# Patient Record
Sex: Male | Born: 1955 | Race: Black or African American | Hispanic: No | Marital: Married | Smoking: Never smoker
Health system: Southern US, Community
[De-identification: ages and names within clinical notes are randomized; demographics above are authoritative.]

## PROBLEM LIST (undated history)

## (undated) DIAGNOSIS — H409 Unspecified glaucoma: Secondary | ICD-10-CM

## (undated) DIAGNOSIS — I10 Essential (primary) hypertension: Secondary | ICD-10-CM

## (undated) DIAGNOSIS — K219 Gastro-esophageal reflux disease without esophagitis: Secondary | ICD-10-CM

## (undated) DIAGNOSIS — E785 Hyperlipidemia, unspecified: Secondary | ICD-10-CM

## (undated) DIAGNOSIS — R0982 Postnasal drip: Secondary | ICD-10-CM

## (undated) DIAGNOSIS — T8859XA Other complications of anesthesia, initial encounter: Secondary | ICD-10-CM

## (undated) DIAGNOSIS — E109 Type 1 diabetes mellitus without complications: Secondary | ICD-10-CM

## (undated) DIAGNOSIS — E039 Hypothyroidism, unspecified: Secondary | ICD-10-CM

## (undated) DIAGNOSIS — Z973 Presence of spectacles and contact lenses: Secondary | ICD-10-CM

## (undated) DIAGNOSIS — N400 Enlarged prostate without lower urinary tract symptoms: Secondary | ICD-10-CM

## (undated) DIAGNOSIS — R0989 Other specified symptoms and signs involving the circulatory and respiratory systems: Secondary | ICD-10-CM

## (undated) HISTORY — DX: Type 1 diabetes mellitus without complications: E10.9

## (undated) HISTORY — DX: Postnasal drip: R09.82

## (undated) HISTORY — PX: KNEE SURGERY: SHX244

## (undated) HISTORY — PX: CHOLECYSTECTOMY: SHX55

## (undated) HISTORY — PX: TOE SURGERY: SHX1073

## (undated) HISTORY — DX: Hypothyroidism, unspecified: E03.9

## (undated) HISTORY — PX: ANKLE SURGERY: SHX546

## (undated) HISTORY — DX: Essential (primary) hypertension: I10

## (undated) HISTORY — DX: Hyperlipidemia, unspecified: E78.5

---
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
## 1986-07-05 HISTORY — PX: KNEE SURGERY: SHX244

## 1993-07-05 HISTORY — PX: CHOLECYSTECTOMY: SHX55

## 1997-07-05 HISTORY — PX: VITRECTOMY: SHX106

## 1998-07-05 HISTORY — PX: OTHER SURGICAL HISTORY: SHX169

## 1999-05-11 ENCOUNTER — Ambulatory Visit (HOSPITAL_COMMUNITY): Admission: RE | Admit: 1999-05-11 | Discharge: 1999-05-12 | Payer: Self-pay | Admitting: Ophthalmology

## 1999-09-14 ENCOUNTER — Ambulatory Visit (HOSPITAL_COMMUNITY): Admission: RE | Admit: 1999-09-14 | Discharge: 1999-09-14 | Payer: Self-pay | Admitting: Ophthalmology

## 1999-09-14 ENCOUNTER — Encounter: Payer: Self-pay | Admitting: Ophthalmology

## 1999-09-21 ENCOUNTER — Encounter: Admission: RE | Admit: 1999-09-21 | Discharge: 1999-12-20 | Payer: Self-pay | Admitting: Internal Medicine

## 2000-01-11 ENCOUNTER — Encounter: Admission: RE | Admit: 2000-01-11 | Discharge: 2000-04-10 | Payer: Self-pay | Admitting: Internal Medicine

## 2000-11-21 ENCOUNTER — Encounter (INDEPENDENT_AMBULATORY_CARE_PROVIDER_SITE_OTHER): Payer: Self-pay | Admitting: Specialist

## 2000-11-21 ENCOUNTER — Ambulatory Visit (HOSPITAL_COMMUNITY): Admission: RE | Admit: 2000-11-21 | Discharge: 2000-11-21 | Payer: Self-pay | Admitting: Gastroenterology

## 2001-07-05 HISTORY — PX: EYE SURGERY: SHX253

## 2002-10-25 ENCOUNTER — Ambulatory Visit (HOSPITAL_COMMUNITY): Admission: RE | Admit: 2002-10-25 | Discharge: 2002-10-25 | Payer: Self-pay | Admitting: *Deleted

## 2003-04-17 ENCOUNTER — Encounter: Admission: RE | Admit: 2003-04-17 | Discharge: 2003-07-16 | Payer: Self-pay | Admitting: Internal Medicine

## 2003-08-05 ENCOUNTER — Ambulatory Visit (HOSPITAL_COMMUNITY): Admission: RE | Admit: 2003-08-05 | Discharge: 2003-08-05 | Payer: Self-pay | Admitting: Ophthalmology

## 2003-11-05 ENCOUNTER — Encounter: Admission: RE | Admit: 2003-11-05 | Discharge: 2004-02-03 | Payer: Self-pay | Admitting: Internal Medicine

## 2005-06-08 ENCOUNTER — Encounter: Admission: RE | Admit: 2005-06-08 | Discharge: 2005-09-06 | Payer: Self-pay | Admitting: Internal Medicine

## 2008-07-05 HISTORY — PX: ANKLE SURGERY: SHX546

## 2009-08-04 ENCOUNTER — Encounter: Admission: RE | Admit: 2009-08-04 | Discharge: 2009-08-04 | Payer: Self-pay | Admitting: Internal Medicine

## 2010-10-24 DIAGNOSIS — I10 Essential (primary) hypertension: Secondary | ICD-10-CM

## 2010-10-24 DIAGNOSIS — E039 Hypothyroidism, unspecified: Secondary | ICD-10-CM

## 2010-10-24 DIAGNOSIS — E119 Type 2 diabetes mellitus without complications: Secondary | ICD-10-CM | POA: Insufficient documentation

## 2010-11-20 NOTE — Op Note (Signed)
[place]. [facility]  Patient:    Tom Baker, Tom Baker                          MRN: [id number] Proc. Date: 09/14/99 Adm. Date:  [id number] Disc. 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  PROCEDURE: 1. Posterior vitrectomy with Endolaser photocoagulation of the left eye. 2. Removal of intraocular implant, vitreous chamber - silicone oil, OS.  SURGEON:  Ernesto Rutherford, M.D.  ANESTHESIA:  Local retrobulbar anesthesia, left eye.  INDICATIONS FOR PROCEDURE:  The patient is a 55 year old man who has profound vision loss of the left eye with potential for good visual acuity.  The patient had a vitrectomy of the left eye with complex retinal detachment repair using intraocular implant - vitreous - silicone oil.  The patient has a chance to regain visual acuity function in the left eye, and to remain employed.  The patient understands this is an attempt to remove the silicone oil, and to diminish likelihood of silicone oil induced cataracts, or other complications, as well as to enhance potential of visual acuity outcome.  He understands the risks of anesthesia including the rare occurrence of death, but also to the eye including hemorrhage, infection, scarring, need for further surgery, retinal detachment, loss of vision, and progression of disease despite intervention.  DESCRIPTION OF PROCEDURE:  After the appropriate  signed consent was obtained, the patient was taken to the operating room.  In the operating room, monitored anesthesia was instituted without difficulty.  5 cc of retrobulbar Marcaine was  injected in the left eye without difficulty and an additional 5 cc of 0.75% Marcaine injected laterally and fashioned in modified van Lint.  The left periocular region was sterilely prepped and draped in the usual ophthalmic fashion. A lid speculum was applied.  Conjunctival peritomy fashioned temporally and superonasal.  A 4 mm disposable infusion was then secured.  Superior sclerotomies were then fashioned.  Wild microscope was placed in position with a biom attachment.  Vitreous fluid evacuator was placed into the vitreous cavity and silicone oil removed without difficulty, and without damage to the retina or to the lens.  Endolaser photocoagulation was then placed encircling the posterior pole and nasally.  The retina stayed nicely attached.  Hemostasis was spontaneous.  At this time, the superior sclerotomy was then closed.  The infusion removed and similarly closed with 7-0 Vicryl suture.  Subconjunctival injection of antibiotic and steroid were applied after the conjunctiva had been closed with 7-0 Vicryl suture.  The patient tolerated the procedure well without complication.  A sterile patch and Fox shield applied.  The patient was taken to the outpatient area to be discharged home as an outpatient. DD:  09/14/99 TD:  09/15/99 Job: [id number]

## 2010-11-20 NOTE — Procedures (Signed)
[place]. [facility]  Patient:    Tom Baker, Tom Baker                          MRN: [id number] Proc. Date: 11/21/00 Adm. Date:  [id number] Attending:  Nelda Marseille CC:         Lindell Spar. Chestine Spore, M.D.   Procedure Report  PROCEDURE:  Colonoscopy with biopsy.  INDICATION:  Patient with family history of colon cancer.  Consent was signed after risks, benefits, methods, and options were thoroughly discussed in the office.  MEDICATIONS:  Demerol 80, Versed 8.  PROCEDURE:  Rectal inspection was pertinent for maybe tiny hemorrhoids. Digital examination was negative.  The video colonoscope was inserted and fairly easily advanced around the colon to the cecum.  No obvious abnormality was seen on insertion.  The cecum was identified by the appendiceal orifice and the ileocecal valve.  In fact, the scope was inserted a very short ways into the terminal ileum, which on quick evaluation was normal.  Scope was slowly withdrawn.  There was a small amount of stool adherent to the wall of various parts of the colon, which could not be washed off and there were some nuts and seeds that did clog the scope frequently, but with lots of washing and suctioning because of a fair prep, we did use over a liter of water.  We were probably able to obtain adequate visualization.  Certainly small lesions could have been missed, but on slow withdrawal through the colon, the cecum, ascending, transverse, descending, and majority of the sigmoid were all normal.  In distal sigmoid, two tiny possible polyp were seen and were each cold biopsied x 1.  Back in the rectum, the scope was retroflexed, pertinent for questionable tiny internal hemorrhoids.  Scope was straightened, air was withdrawn and the scope removed.  The patient tolerated the procedure well. There were no obvious immediate complication.  Not mentioned above, to advance to the cecum, did require some abdominal pressure, but no  position changes. The patient tolerated the procedure well.  There was no obvious immediate complication.  ENDOSCOPIC DIAGNOSES: 1. Questionable tiny internal and external hemorrhoids. 2. Two tiny distal sigmoid possible polyps, status post cold biopsy. 3. Some stool adherent to the walls of the colon unable to wash off,    particularly in the rectum, cecum, and flexures and ascending colon. 4. Otherwise within normal limits to the cecum in the quick look at the end of    the terminal ileum.  PLAN:  Await pathology, but recheck colon screening in five years.  Probably would use more aggressive colon prep.  Follow-up p.r.n., otherwise return care to Dr. Chestine Spore for the customary healthcare maintenance to include yearly rectals and guaiacs. DD:  11/21/00 TD:  11/21/00 Job: [id number]

## 2015-07-06 DIAGNOSIS — I3139 Other pericardial effusion (noninflammatory): Secondary | ICD-10-CM

## 2015-07-06 DIAGNOSIS — I313 Pericardial effusion (noninflammatory): Secondary | ICD-10-CM

## 2015-07-06 DIAGNOSIS — J189 Pneumonia, unspecified organism: Secondary | ICD-10-CM

## 2015-07-06 HISTORY — DX: Other pericardial effusion (noninflammatory): I31.39

## 2015-07-06 HISTORY — DX: Pericardial effusion (noninflammatory): I31.3

## 2015-07-06 HISTORY — DX: Pneumonia, unspecified organism: J18.9

## 2015-11-06 ENCOUNTER — Encounter: Payer: Self-pay | Admitting: Cardiovascular Disease

## 2015-11-06 ENCOUNTER — Ambulatory Visit (INDEPENDENT_AMBULATORY_CARE_PROVIDER_SITE_OTHER): Payer: BC Managed Care – PPO | Admitting: Cardiovascular Disease

## 2015-11-06 VITALS — BP 140/74 | HR 80 | Ht 75.0 in | Wt 217.8 lb

## 2015-11-06 DIAGNOSIS — R0602 Shortness of breath: Secondary | ICD-10-CM

## 2015-11-06 DIAGNOSIS — E785 Hyperlipidemia, unspecified: Secondary | ICD-10-CM | POA: Diagnosis not present

## 2015-11-06 DIAGNOSIS — I10 Essential (primary) hypertension: Secondary | ICD-10-CM

## 2015-11-06 DIAGNOSIS — E109 Type 1 diabetes mellitus without complications: Secondary | ICD-10-CM | POA: Insufficient documentation

## 2015-11-06 DIAGNOSIS — R600 Localized edema: Secondary | ICD-10-CM | POA: Diagnosis not present

## 2015-11-06 DIAGNOSIS — E039 Hypothyroidism, unspecified: Secondary | ICD-10-CM | POA: Insufficient documentation

## 2015-11-06 NOTE — Patient Instructions (Signed)
Medication Instructions:  Your physician recommends that you continue on your current medications as directed. Please refer to the Current Medication list given to you today.  Labwork: NONE  Testing/Procedures: Your physician has requested that you have a lexiscan myoview. For further information please visit [email or web address]. Please follow instruction sheet, as given.  Your physician has requested that you have an echocardiogram. Echocardiography is a painless test that uses sound waves to create images of your heart. It provides your doctor with information about the size and shape of your heart and how well your heart's chambers and valves are working. This procedure takes approximately one hour. There are no restrictions for this procedure.  Follow-Up: Your physician wants you to follow-up in: 1 YEAR OV You will receive a reminder letter in the mail two months in advance. If you don't receive a letter, please call our office to schedule the follow-up appointment.  Any Other Special Instructions Will Be Listed Below (If Applicable).     If you need a refill on your cardiac medications before your next appointment, please call your pharmacy.

## 2015-11-06 NOTE — Progress Notes (Signed)
Cardiology Office Note   Date:  11/06/2015   ID:  Tom Baker, DOB [birth year], MRN [id number]  PCP:  Laurena Slimmer, MD  Cardiologist:   Chilton Si, MD   Chief Complaint  Patient presents with  . New Evaluation    Self-Referred  pt states he has been diabetic since he was 14; PCP wants him to have a stress test--consult; pt c/o swelling in ankles--depends on how long he stays on his feet; Hypertension     History of Present Illness: Tom Baker is a 60 y.o. male with hypertension, diabetes mellitus type I, and hypothyroidism who presents for an evaluation of lower extremity edema.  Tom Baker saw his PCP, Tom Baker, for routine follow up.  Given that he was diagnosed with DM Type I at age 56, Tom Baker recommended that he be evaluated by a cardiologist and considered for stress testing.  Tom Baker has been feeling well.  He has not noted any chest pain or shortness of breath he did not know any lower extremity edema but reports that his PCP stains. He has not noted any orthopnea or PND.  He does not get any formal exercise but is very active at work and has no limitations with these activities.  He has neuropathy in his legs and sometimes gets fatigue in his legs when walking long distances.   Past Medical History  Diagnosis Date  . Diabetes mellitus type 1 (HCC)   . Hypertension   . Hyperlipidemia   . Hypothyroid     No past surgical history on file.   Current Outpatient Prescriptions  Medication Sig Dispense Refill  . amLODipine (NORVASC) 5 MG tablet Take 5 mg by mouth daily.      Marland Kitchen amLODipine-atorvastatin (CADUET) 5-10 MG per tablet Take 1 tablet by mouth daily.      . Cholecalciferol (VITAMIN D3) 2000 units TABS Take 1 tablet by mouth daily.    . Insulin Infusion Pump DEVI by Does not apply route.      Marland Kitchen levothyroxine (SYNTHROID, LEVOTHROID) 125 MCG tablet Take 125 mcg by mouth daily.      Marland Kitchen losartan (COZAAR) 100 MG tablet Take 100 mg by mouth daily.      . Multiple  Vitamin (MULTIVITAMIN) tablet Take 1 tablet by mouth daily.    . NON FORMULARY 2 tbsp daily     No current facility-administered medications for this visit.    Allergies:   Review of patient's allergies indicates no known allergies.    Social History:  The patient  reports that he has never smoked. He does not have any smokeless tobacco history on file. He reports that he does not drink alcohol or use illicit drugs.   Family History:  The patient's family history is not on file.    ROS:  Please see the history of present illness.   Otherwise, review of systems are positive for none.   All other systems are reviewed and negative.   PHYSICAL EXAM: VS:  BP 140/74 mmHg  Pulse 80  Ht  (1.905 m)  Wt 98.793 kg (217 lb 12.8 oz)  BMI 27.22 kg/m2 , BMI Body mass index is 27.22 kg/(m^2). GENERAL:  Well appearing HEENT:  Pupils equal round and reactive, fundi not visualized, oral mucosa unremarkable NECK:  No jugular venous distention, waveform within normal limits, carotid upstroke brisk and symmetric, no bruits, no thyromegaly LYMPHATICS:  No cervical adenopathy LUNGS:  Clear to auscultation bilaterally HEART:  RRR.  PMI  not displaced or sustained,S1 and S2 within normal limits, no S3, no S4, no clicks, no rubs, no murmurs ABD:  Flat, positive bowel sounds normal in frequency in pitch, no bruits, no rebound, no guarding, no midline pulsatile mass, no hepatomegaly, no splenomegaly EXT:  2 plus pulses throughout, 1+ pitting edema bilaterally , no cyanosis no clubbing SKIN:  No rashes no nodules NEURO:  Cranial nerves II through XII grossly intact, motor grossly intact throughout PSYCH:  Cognitively intact, oriented to person place and time   EKG:  EKG is ordered today. The ekg ordered today demonstrates sinus rhythm. Rate 80 bpm.   Recent Labs: No results found for requested labs within last 365 days.    Lipid Panel No results found for: CHOL, TRIG, HDL, CHOLHDL, VLDL, LDLCALC,  LDLDIRECT   09/25/15: Sodium 140, potassium 4.5, BUN 15, creatinine 1.1 AST 26, ALT 20 WBC 5.4, hemoglobin 14.8, hematocrit 43.2, platelets 304 Total cholesterol 126, triglycerides 131, HDL 48, LDL 52 Hemoglobin Q4OA1c 6.1%.  Wt Readings from Last 3 Encounters:  11/06/15 98.793 kg (217 lb 12.8 oz)      ASSESSMENT AND PLAN:  # CV Risk: Tom Baker is at high risk for CAD given his long history of diabetes.  His A1c is well-controlled and he is on the appropriate therapy a for hypertension and hyperlipidemia.  We will obtain a Lexiscan Cardiolite to evaluate for ischemia, as diabetic patients often can have silent ischemia.  He is unable to walk on a treadmill due to an old ankle injury.  # Lower extremity edema: Tom Baker does not appear to have heart failure on exam.  He has some mild bilateral LE edema.  We will obtain an echo to ensure that he does not have any structural heart disease.  # Hypertension: Blood pressure is well-controlled.  Continue amlodipine and losartan.  # Hyperlipidemia: LDL is at goal. Continue atorvastatin.  Current medicines are reviewed at length with the patient today.  The patient does not have concerns regarding medicines.  The following changes have been made:  no change  Labs/ tests ordered today include:   Orders Placed This Encounter  Procedures  . Myocardial Perfusion Imaging  . EKG 12-Lead  . ECHOCARDIOGRAM COMPLETE     Disposition:   FU with Ashvin Adelson C. Duke Salviaandolph, MD, [facility] in 1 year.    This note was written with the assistance of speech recognition software.  Please excuse any transcriptional errors.  Signed, Catcher Dehoyos C. Duke Salviaandolph, MD, [facility]  11/06/2015 5:32 PM    [facility]

## 2015-12-03 ENCOUNTER — Telehealth (HOSPITAL_COMMUNITY): Payer: Self-pay

## 2015-12-03 NOTE — Telephone Encounter (Signed)
Encounter complete. 

## 2015-12-05 ENCOUNTER — Encounter (HOSPITAL_COMMUNITY): Payer: BC Managed Care – PPO

## 2015-12-05 ENCOUNTER — Ambulatory Visit (HOSPITAL_COMMUNITY)
Admission: RE | Admit: 2015-12-05 | Discharge: 2015-12-05 | Disposition: A | Discharge status: Home / Self Care | Payer: BC Managed Care – PPO | Source: Ambulatory Visit | Attending: Cardiovascular Disease | Admitting: Cardiovascular Disease

## 2015-12-05 DIAGNOSIS — E109 Type 1 diabetes mellitus without complications: Secondary | ICD-10-CM | POA: Insufficient documentation

## 2015-12-05 DIAGNOSIS — R079 Chest pain, unspecified: Secondary | ICD-10-CM | POA: Diagnosis not present

## 2015-12-05 DIAGNOSIS — I10 Essential (primary) hypertension: Secondary | ICD-10-CM | POA: Diagnosis not present

## 2015-12-05 DIAGNOSIS — R0602 Shortness of breath: Secondary | ICD-10-CM

## 2015-12-05 DIAGNOSIS — R600 Localized edema: Secondary | ICD-10-CM | POA: Insufficient documentation

## 2015-12-05 LAB — MYOCARDIAL PERFUSION IMAGING
CHL CUP NUCLEAR SSS: 2
CSEPPHR: 113 {beats}/min
LV sys vol: 28 mL
LVDIAVOL: 90 mL (ref 62–150)
Rest HR: 102 {beats}/min
SDS: 0
SRS: 2
TID: 1.02

## 2015-12-05 MED ORDER — TECHNETIUM TC 99M TETROFOSMIN IV KIT
29.5000 | PACK | Freq: Once | INTRAVENOUS | Status: AC | PRN
  Administered 2015-12-05: 29.5 via INTRAVENOUS
  Filled 2015-12-05: qty 30

## 2015-12-05 MED ORDER — TECHNETIUM TC 99M TETROFOSMIN IV KIT
9.3000 | PACK | Freq: Once | INTRAVENOUS | Status: AC | PRN
  Administered 2015-12-05: 9.3 via INTRAVENOUS
  Filled 2015-12-05: qty 9

## 2015-12-05 MED ORDER — REGADENOSON 0.4 MG/5ML IV SOLN
0.4000 mg | Freq: Once | INTRAVENOUS | Status: AC
  Administered 2015-12-05: 0.4 mg via INTRAVENOUS

## 2015-12-10 ENCOUNTER — Other Ambulatory Visit: Payer: Self-pay | Admitting: Internal Medicine

## 2015-12-10 ENCOUNTER — Ambulatory Visit (HOSPITAL_COMMUNITY)
Admission: RE | Admit: 2015-12-10 | Discharge: 2015-12-10 | Disposition: A | Discharge status: Home / Self Care | Payer: BC Managed Care – PPO | Source: Ambulatory Visit | Attending: Internal Medicine | Admitting: Internal Medicine

## 2015-12-10 DIAGNOSIS — R059 Cough, unspecified: Secondary | ICD-10-CM

## 2015-12-10 DIAGNOSIS — R05 Cough: Secondary | ICD-10-CM

## 2015-12-10 DIAGNOSIS — R509 Fever, unspecified: Secondary | ICD-10-CM | POA: Diagnosis present

## 2015-12-12 ENCOUNTER — Emergency Department (HOSPITAL_COMMUNITY): Payer: BC Managed Care – PPO

## 2015-12-12 ENCOUNTER — Inpatient Hospital Stay (HOSPITAL_COMMUNITY)
Admission: EM | Admit: 2015-12-12 | Discharge: 2015-12-16 | DRG: 194 | Disposition: A | Discharge status: Home / Self Care | Payer: BC Managed Care – PPO | Attending: Internal Medicine | Admitting: Internal Medicine

## 2015-12-12 ENCOUNTER — Encounter (HOSPITAL_COMMUNITY): Payer: Self-pay

## 2015-12-12 DIAGNOSIS — J90 Pleural effusion, not elsewhere classified: Secondary | ICD-10-CM | POA: Diagnosis present

## 2015-12-12 DIAGNOSIS — N179 Acute kidney failure, unspecified: Secondary | ICD-10-CM | POA: Diagnosis present

## 2015-12-12 DIAGNOSIS — I480 Paroxysmal atrial fibrillation: Secondary | ICD-10-CM | POA: Diagnosis present

## 2015-12-12 DIAGNOSIS — R55 Syncope and collapse: Secondary | ICD-10-CM | POA: Diagnosis not present

## 2015-12-12 DIAGNOSIS — E108 Type 1 diabetes mellitus with unspecified complications: Secondary | ICD-10-CM

## 2015-12-12 DIAGNOSIS — E038 Other specified hypothyroidism: Secondary | ICD-10-CM | POA: Insufficient documentation

## 2015-12-12 DIAGNOSIS — I10 Essential (primary) hypertension: Secondary | ICD-10-CM | POA: Diagnosis present

## 2015-12-12 DIAGNOSIS — Z9641 Presence of insulin pump (external) (internal): Secondary | ICD-10-CM | POA: Diagnosis present

## 2015-12-12 DIAGNOSIS — E039 Hypothyroidism, unspecified: Secondary | ICD-10-CM | POA: Diagnosis present

## 2015-12-12 DIAGNOSIS — I309 Acute pericarditis, unspecified: Secondary | ICD-10-CM | POA: Insufficient documentation

## 2015-12-12 DIAGNOSIS — I313 Pericardial effusion (noninflammatory): Secondary | ICD-10-CM | POA: Diagnosis present

## 2015-12-12 DIAGNOSIS — Z794 Long term (current) use of insulin: Secondary | ICD-10-CM

## 2015-12-12 DIAGNOSIS — E785 Hyperlipidemia, unspecified: Secondary | ICD-10-CM | POA: Diagnosis not present

## 2015-12-12 DIAGNOSIS — J181 Lobar pneumonia, unspecified organism: Secondary | ICD-10-CM | POA: Diagnosis not present

## 2015-12-12 DIAGNOSIS — J9811 Atelectasis: Secondary | ICD-10-CM | POA: Diagnosis present

## 2015-12-12 DIAGNOSIS — J189 Pneumonia, unspecified organism: Secondary | ICD-10-CM

## 2015-12-12 DIAGNOSIS — I4892 Unspecified atrial flutter: Secondary | ICD-10-CM | POA: Insufficient documentation

## 2015-12-12 DIAGNOSIS — E109 Type 1 diabetes mellitus without complications: Secondary | ICD-10-CM | POA: Diagnosis present

## 2015-12-12 DIAGNOSIS — I3139 Other pericardial effusion (noninflammatory): Secondary | ICD-10-CM

## 2015-12-12 DIAGNOSIS — I119 Hypertensive heart disease without heart failure: Secondary | ICD-10-CM | POA: Diagnosis present

## 2015-12-12 LAB — CBC
HEMATOCRIT: 38.4 % — AB (ref 39.0–52.0)
HEMOGLOBIN: 12.8 g/dL — AB (ref 13.0–17.0)
MCH: 27.6 pg (ref 26.0–34.0)
MCHC: 33.3 g/dL (ref 30.0–36.0)
MCV: 82.8 fL (ref 78.0–100.0)
Platelets: 437 10*3/uL — ABNORMAL HIGH (ref 150–400)
RBC: 4.64 MIL/uL (ref 4.22–5.81)
RDW: 13.7 % (ref 11.5–15.5)
WBC: 7 10*3/uL (ref 4.0–10.5)

## 2015-12-12 LAB — URINALYSIS, ROUTINE W REFLEX MICROSCOPIC
BILIRUBIN URINE: NEGATIVE
Glucose, UA: NEGATIVE mg/dL
HGB URINE DIPSTICK: NEGATIVE
KETONES UR: NEGATIVE mg/dL
Leukocytes, UA: NEGATIVE
NITRITE: NEGATIVE
Protein, ur: NEGATIVE mg/dL
SPECIFIC GRAVITY, URINE: 1.011 (ref 1.005–1.030)
pH: 5.5 (ref 5.0–8.0)

## 2015-12-12 LAB — TROPONIN I: Troponin I: <0.03 ng/mL (ref <0.031)

## 2015-12-12 LAB — BASIC METABOLIC PANEL
Anion gap: 9 (ref 5–15)
BUN: 12 mg/dL (ref 6–20)
CALCIUM: 9.1 mg/dL (ref 8.9–10.3)
CO2: 23 mmol/L (ref 22–32)
Chloride: 103 mmol/L (ref 101–111)
Creatinine, Ser: 1.29 mg/dL — ABNORMAL HIGH (ref 0.61–1.24)
GFR, EST NON AFRICAN AMERICAN: 59 mL/min — AB (ref >60)
GLUCOSE: 85 mg/dL (ref 65–99)
Potassium: 3.9 mmol/L (ref 3.5–5.1)
SODIUM: 135 mmol/L (ref 135–145)

## 2015-12-12 LAB — D-DIMER, QUANTITATIVE: D-Dimer, Quant: 3.98 ug/mL-FEU — ABNORMAL HIGH (ref 0.00–0.50)

## 2015-12-12 LAB — TSH: TSH: 2.745 u[IU]/mL (ref 0.350–4.500)

## 2015-12-12 LAB — GLUCOSE, CAPILLARY: GLUCOSE-CAPILLARY: 96 mg/dL (ref 65–99)

## 2015-12-12 LAB — I-STAT TROPONIN, ED: Troponin i, poc: 0 ng/mL (ref 0.00–0.08)

## 2015-12-12 LAB — CBG MONITORING, ED
Glucose-Capillary: 87 mg/dL (ref 65–99)
Glucose-Capillary: 81 mg/dL (ref 65–99)
Glucose-Capillary: 71 mg/dL (ref 65–99)

## 2015-12-12 MED ORDER — LOSARTAN POTASSIUM 50 MG PO TABS
100.0000 mg | ORAL_TABLET | Freq: Every day | ORAL | Status: DC
  Administered 2015-12-13 – 2015-12-16 (×4): 100 mg via ORAL
  Filled 2015-12-12 (×4): qty 2

## 2015-12-12 MED ORDER — AMLODIPINE BESYLATE 5 MG PO TABS
10.0000 mg | ORAL_TABLET | Freq: Every day | ORAL | Status: DC
  Administered 2015-12-13 – 2015-12-16 (×3): 10 mg via ORAL
  Filled 2015-12-12 (×5): qty 2

## 2015-12-12 MED ORDER — BENZONATATE 100 MG PO CAPS
100.0000 mg | ORAL_CAPSULE | Freq: Three times a day (TID) | ORAL | Status: DC | PRN
  Administered 2015-12-12: 100 mg via ORAL
  Filled 2015-12-12: qty 1

## 2015-12-12 MED ORDER — SODIUM CHLORIDE 0.9 % IV SOLN: INTRAVENOUS | Status: DC

## 2015-12-12 MED ORDER — DEXTROSE 5 % IV SOLN
500.0000 mg | Freq: Once | INTRAVENOUS | Status: AC
  Administered 2015-12-12: 500 mg via INTRAVENOUS
  Filled 2015-12-12: qty 500

## 2015-12-12 MED ORDER — POLYETHYLENE GLYCOL 3350 17 G PO PACK
17.0000 g | PACK | Freq: Every day | ORAL | Status: DC | PRN
  Administered 2015-12-12 – 2015-12-13 (×2): 17 g via ORAL
  Filled 2015-12-12 (×2): qty 1

## 2015-12-12 MED ORDER — LEVOTHYROXINE SODIUM 125 MCG PO TABS
125.0000 ug | ORAL_TABLET | Freq: Every day | ORAL | Status: DC
  Administered 2015-12-13 – 2015-12-16 (×4): 125 ug via ORAL
  Filled 2015-12-12 (×4): qty 1

## 2015-12-12 MED ORDER — DEXTROSE 5 % IV SOLN
500.0000 mg | Freq: Once | INTRAVENOUS | Status: DC
  Filled 2015-12-12: qty 500

## 2015-12-12 MED ORDER — VITAMIN D 1000 UNITS PO TABS
2000.0000 [IU] | ORAL_TABLET | Freq: Every day | ORAL | Status: DC
  Administered 2015-12-13 – 2015-12-16 (×4): 2000 [IU] via ORAL
  Filled 2015-12-12 (×4): qty 2

## 2015-12-12 MED ORDER — ATORVASTATIN CALCIUM 10 MG PO TABS
10.0000 mg | ORAL_TABLET | Freq: Every day | ORAL | Status: DC
  Administered 2015-12-13 – 2015-12-16 (×4): 10 mg via ORAL
  Filled 2015-12-12 (×4): qty 1

## 2015-12-12 MED ORDER — DEXTROSE 5 % IV SOLN
1.0000 g | Freq: Once | INTRAVENOUS | Status: AC
  Administered 2015-12-12: 1 g via INTRAVENOUS
  Filled 2015-12-12: qty 10

## 2015-12-12 MED ORDER — ENOXAPARIN SODIUM 100 MG/ML ~~LOC~~ SOLN
1.0000 mg/kg | Freq: Two times a day (BID) | SUBCUTANEOUS | Status: DC
  Administered 2015-12-12: 100 mg via SUBCUTANEOUS
  Filled 2015-12-12 (×2): qty 1

## 2015-12-12 MED ORDER — ADULT MULTIVITAMIN W/MINERALS CH
1.0000 | ORAL_TABLET | Freq: Every day | ORAL | Status: DC
  Administered 2015-12-13 – 2015-12-16 (×4): 1 via ORAL
  Filled 2015-12-12 (×4): qty 1

## 2015-12-12 NOTE — ED Notes (Signed)
Attempted report 

## 2015-12-12 NOTE — Progress Notes (Signed)
ANTICOAGULATION CONSULT NOTE - Initial Consult  Pharmacy Consult for enoxaparin Indication: atrial fibrillation  No Known Allergies  Patient Measurements: TBW 98.4 kg  Vital Signs: Temp: 99.1 F (37.3 C) (06/09 2057) Temp Source: Oral (06/09 2057) BP: 124/72 mmHg (06/09 2057) Pulse Rate: 96 (06/09 2057)  Labs:  Recent Labs  12/12/15 1646  HGB 12.8*  HCT 38.4*  PLT 437*  CREATININE 1.29*    Estimated Creatinine Clearance: 72.8 mL/min (by C-G formula based on Cr of 1.29).   Medical History: Past Medical History  Diagnosis Date  . Diabetes mellitus type 1 (HCC)   . Hypertension   . Hyperlipidemia   . Hypothyroid     Assessment: 60 yo M presents on 6/9 with coughing. Also noted to be in Afib. No prior history of Afib. Pharmacy consulted to start enoxaparin for Afib. Hgb 12.8, plts 437. SCr 1.29, CrCl ~6470ml/min.  Goal of Therapy:  Monitor platelets by anticoagulation protocol: Yes   Plan:  Start enoxaparin 100mg  [facility] Q12 Monitor CBC, s/s of bleed   Enzo BiNathan Masaru Chamberlin, PharmD, [facility] Clinical Pharmacist Pager [phone] 12/12/2015 9:47 PM

## 2015-12-12 NOTE — H&P (Signed)
History and Physical    Tom Baker ZOX:[id number] DOB: 1955-08-15 DOA: 12/12/2015  Referring MD/NP/PA:  Roxy Horseman PA-C PCP: Laurena Slimmer, MD  Patient coming from: Home  Chief Complaint:  Syncope  HPI: Tom Baker is a 60 y.o. male with medical history significant of HTN, HLD,  diabetes mellitus type 1, and hypothyroidism; who presents after having a syncopal episode. Patient reports this is the second episode in the last week. Patient notes that he never had any syncopal events previously in the past. First event happened while he was at work and he notes that he started coughing and then passed out. The second episode happened today and he reports coughing spell right before passing out as well. He's been dealing with a cough for the last 1 1/2 weeks for which he saw his primary care provider 3 days ago. At that time they checked a chest x-ray and thought that he may have bronchiolitis and he was given what he believes to have been antibiotics to take for 3 days and a cough medicine to take at night . Overall he states that the cough has been getting better.  Denies having any chest pain or symptoms of shortness of breath.  Associated symptoms includedmild leg swelling, decreased appetite, decreased fluid intake, constipation, and increased blood sugars initially when the symptoms first started.  Patient's on insulin pump and noticed that his blood sugars are typically well controlled.  ED Course: Upon admission to the emergency department patient was evaluated and seen to have a temperature of 99.2F, respirations up to 29, tachycardic with heart rates up to 107, and blood pressures maintained, O2 saturations 94-98% on room air. Lab work revealed hemoglobin of 12.8, platelets 437, creatinine of 1.29, BUN 12, troponin 2 negative.  EKG revealed new onset atrial flutter that appear to be rate controlled with heart rates less than 110. Chest x-ray revealed possible left sided pleural effusion  with possible underlying infiltrate. Patient was started on on IV Rocephin and azithromycin   Review of Systems: As per HPI otherwise 10 point review of systems negative.   Past Medical History  Diagnosis Date  . Diabetes mellitus type 1 (HCC)   . Hypertension   . Hyperlipidemia   . Hypothyroid     History reviewed. No pertinent past surgical history.   reports that he has never smoked. He does not have any smokeless tobacco history on file. He reports that he does not drink alcohol or use illicit drugs.  No Known Allergies  History reviewed. No pertinent family history.  Prior to Admission medications   Medication Sig Start Date End Date Taking? Authorizing Provider  amLODipine (NORVASC) 10 MG tablet Take 10 mg by mouth daily.   Yes Historical Provider, MD  atorvastatin (LIPITOR) 10 MG tablet Take 10 mg by mouth daily.   Yes Historical Provider, MD  Cholecalciferol (VITAMIN D3) 2000 units TABS Take 1 tablet by mouth daily.   Yes Historical Provider, MD  Insulin Infusion Pump DEVI by Does not apply route. Novolog Insulin   Yes Historical Provider, MD  levothyroxine (SYNTHROID, LEVOTHROID) 125 MCG tablet Take 125 mcg by mouth daily.     Yes Historical Provider, MD  losartan (COZAAR) 100 MG tablet Take 100 mg by mouth daily.     Yes Historical Provider, MD  Multiple Vitamin (MULTIVITAMIN) tablet Take 1 tablet by mouth daily.   Yes Historical Provider, MD  PRESCRIPTION MEDICATION Medication Olive oil : 30ml by mouth daily   Yes Historical Provider,  MD    Physical Exam:    Constitutional: NAD, calm, comfortable Filed Vitals:   12/12/15 1915 12/12/15 2000 12/12/15 2045 12/12/15 2057  BP: 152/81 124/70 139/64 124/72  Pulse: 102 84 78 96  Temp:    99.1 F (37.3 C)  TempSrc:    Oral  Resp: SpO2: 97% 94% 94% 95%   Eyes: PERRL, lids and conjunctivae normal ENMT: Mucous membranes are moist. Posterior pharynx clear of any exudate or lesions.Normal dentition.  Neck:  normal, supple, no masses, no thyromegaly + JVD 1/4 to the angle of the jaw Respiratory: Left lower lung field decreased aeration. Normal respiratory effort. No accessory muscle use.  Cardiovascular: Regular rate and rhythm, no murmurs / rubs / gallops. No extremity edema. 2+ pedal pulses. No carotid bruits.  Abdomen: no tenderness, no masses palpated. No hepatosplenomegaly. Bowel sounds positive.  Musculoskeletal: no clubbing / cyanosis. No joint deformity upper and lower extremities. Good ROM, no contractures. Normal muscle tone.  Skin: no rashes, lesions, ulcers. No induration Neurologic: CN 2-12 grossly intact. Sensation intact, DTR normal. Strength 5/5 in all 4.  Psychiatric: Normal judgment and insight. Alert and oriented x 3. Normal mood.     Labs on Admission: I have personally reviewed following labs and imaging studies  CBC:  Recent Labs Lab 12/12/15 1646  WBC 7.0  HGB 12.8*  HCT 38.4*  MCV 82.8  PLT 437*   Basic Metabolic Panel:  Recent Labs Lab 12/12/15 1646  NA 135  K 3.9  CL 103  CO2 23  GLUCOSE 85  BUN 12  CREATININE 1.29*  CALCIUM 9.1   GFR: Estimated Creatinine Clearance: 72.8 mL/min (by C-G formula based on Cr of 1.29). Liver Function Tests: No results for input(s): AST, ALT, ALKPHOS, BILITOT, PROT, ALBUMIN in the last 168 hours. No results for input(s): LIPASE, AMYLASE in the last 168 hours. No results for input(s): AMMONIA in the last 168 hours. Coagulation Profile: No results for input(s): INR, PROTIME in the last 168 hours. Cardiac Enzymes: No results for input(s): CKTOTAL, CKMB, CKMBINDEX, TROPONINI in the last 168 hours. BNP (last 3 results) No results for input(s): PROBNP in the last 8760 hours. HbA1C: No results for input(s): HGBA1C in the last 72 hours. CBG:  Recent Labs Lab 12/12/15 1633 12/12/15 1748 12/12/15 1842  GLUCAP 87 81 71   Lipid Profile: No results for input(s): CHOL, HDL, LDLCALC, TRIG, CHOLHDL, LDLDIRECT in the  last 72 hours. Thyroid Function Tests: No results for input(s): TSH, T4TOTAL, FREET4, T3FREE, THYROIDAB in the last 72 hours. Anemia Panel: No results for input(s): VITAMINB12, FOLATE, FERRITIN, TIBC, IRON, RETICCTPCT in the last 72 hours. Urine analysis:    Component Value Date/Time   COLORURINE YELLOW 12/12/2015 1750   APPEARANCEUR CLEAR 12/12/2015 1750   LABSPEC 1.011 12/12/2015 1750   PHURINE 5.5 12/12/2015 1750   GLUCOSEU NEGATIVE 12/12/2015 1750   HGBUR NEGATIVE 12/12/2015 1750   BILIRUBINUR NEGATIVE 12/12/2015 1750   KETONESUR NEGATIVE 12/12/2015 1750   PROTEINUR NEGATIVE 12/12/2015 1750   NITRITE NEGATIVE 12/12/2015 1750   LEUKOCYTESUR NEGATIVE 12/12/2015 1750   Sepsis Labs: No results found for this or any previous visit (from the past 240 hour(s)).   Radiological Exams on Admission: Dg Chest 2 View  12/12/2015  CLINICAL DATA:  Syncope and cough. EXAM: CHEST  2 VIEW COMPARISON:  December 10, 2015 FINDINGS: Stable cardiomegaly. The hila and mediastinum are unchanged. No pneumothorax. No pulmonary nodules or masses. Mild opacity and probable small  effusions seen in left base common and inguinal. No other interval changes or acute abnormalities. IMPRESSION: Probable small left effusion with underlying opacity. Recommend follow-up to resolution. Electronically Signed   By: Gerome Samavid  Williams III M.D   On: 12/12/2015 20:34    EKG: Independently reviewed.  atrial flutter/fib  Assessment/Plan Syncope: Recurrent. Patient with no previoushistory of syncope in the past who presents with 2 events in the last week. Question the possibility of arrhythmia versus vasovagal versus dehydration versus PE. - Admit to a telemetry bed  - Check orthostatic vital signs - Trend cardiac troponins - Check d-dimer. D-dimer was obtained and seen to be elevated at 3.98. Now question of symptoms could be related to a acute PE. - Checked stat CT angiogram of the chest which revealed a revealed a moderate sized  pericardial  Pericardial effusion - Follow-up echocardiogram  Atrial flutter/atrial fib: New onset. Patient currently rate controlled heart rates less than 110. Chadvasc Score 2 - Check echocardiogram in a.m. - Lovenox per pharmacy initially started due to atrial flutter   Left sided pleural effusion with possible community-acquired pneumonia: Patient notes previous history of cough which had appeared to to be improving. Chest x-ray revealed a left-sided pleural effusion with possible infiltrate. - Tessalon Perles - Continue ceftriaxone and azithromycin IV for now; discontinued  Acute kidney injury: Patient's baseline kidney function unknown at this time. Creatinine elevated at 1.29 on admission and patient notes decreased oral intake recently over the last week. - IV fluids - Recheck BMP in a.m. - Would recommend further workup if needed  Essential hypertension - Continue amlodipine and losartan   Diabetes mellitus type 1 on insulin pump - Hypoglycemic protocols - Continue insulin pump per home regimen  Hypothyroidism - Check TSH - Continue levothyroxine  Hyperlipidemia - Continue atorvastatin  DVT prophylaxis: Lovenox discontinued based upon cardiology's evaluation Code Status: Full Family Communication:  None Disposition Plan: Undetermined Consults called: Cardiology Admission status: Telemetry observation  Clydie Braunondell A Fleet Higham MD [facility] Pager [phone]  If 7PM-7AM, please contact night-coverage [email or web address] Password [email or web address]  12/12/2015, 9:22 PM

## 2015-12-12 NOTE — ED Notes (Signed)
Pt given apple juice d/t low blood glucose per Toni Amendourtney, RN

## 2015-12-12 NOTE — ED Notes (Signed)
Pt states has been coughing. Placed on abx for bronchial infection. Pt had syncopal episode ~ 2 hrs ago. Pt Type 1 DM. States CBG = 129. Denies any head trauma. States syncope occurred after coughing fit.

## 2015-12-12 NOTE — ED Notes (Signed)
CBG 71.  

## 2015-12-12 NOTE — ED Provider Notes (Signed)
CSN: [id number]     Arrival date & time 12/12/15  1623 History   First MD Initiated Contact with Patient 12/12/15 1845     Chief Complaint  Patient presents with  . Loss of Consciousness     (Consider location/radiation/quality/duration/timing/severity/associated sxs/prior Treatment) HPI Comments: Patient with past medical history of diabetes, hypertension, hyperlipidemia, hypothyroid, presents to the emergency department with chief complaint of syncope. He states that he has had 2 syncopal episodes over the past week. States that both of the episodes occurred after he was having a coughing fit. Patient states that he was seen by his primary care doctor, and was diagnosed with an infection in his "bronchioles."  He denies any fevers chills. Denies any chest pain or shortness of breath. Denies any abdominal pain. He denies getting lightheaded prior to passing out. A family member who saw him pass out, states that he was out for a few seconds, and quickly return to normal mental baseline. Family member is concerned about seizure.  The history is provided by the patient. No language interpreter was used.    Past Medical History  Diagnosis Date  . Diabetes mellitus type 1 (HCC)   . Hypertension   . Hyperlipidemia   . Hypothyroid    History reviewed. No pertinent past surgical history. History reviewed. No pertinent family history. Social History  Substance Use Topics  . Smoking status: Never Smoker   . Smokeless tobacco: None  . Alcohol Use: No     Comment: last used 1992    Review of Systems  Constitutional: Negative for fever and chills.  Respiratory: Positive for cough. Negative for shortness of breath.   Cardiovascular: Negative for chest pain.  Gastrointestinal: Negative for nausea, vomiting, diarrhea and constipation.  Genitourinary: Negative for dysuria.  Neurological: Positive for syncope.  All other systems reviewed and are negative.     Allergies  Review of patient's  allergies indicates no known allergies.  Home Medications   Prior to Admission medications   Medication Sig Start Date End Date Taking? Authorizing Provider  amLODipine (NORVASC) 5 MG tablet Take 5 mg by mouth daily.      Historical Provider, MD  amLODipine-atorvastatin (CADUET) 5-10 MG per tablet Take 1 tablet by mouth daily.      Historical Provider, MD  Cholecalciferol (VITAMIN D3) 2000 units TABS Take 1 tablet by mouth daily.    Historical Provider, MD  Insulin Infusion Pump DEVI by Does not apply route.      Historical Provider, MD  levothyroxine (SYNTHROID, LEVOTHROID) 125 MCG tablet Take 125 mcg by mouth daily.      Historical Provider, MD  losartan (COZAAR) 100 MG tablet Take 100 mg by mouth daily.      Historical Provider, MD  Multiple Vitamin (MULTIVITAMIN) tablet Take 1 tablet by mouth daily.    Historical Provider, MD  NON FORMULARY 2 tbsp daily    Historical Provider, MD   BP 140/80 mmHg  Pulse 86  Temp(Src) 99.8 F (37.7 C) (Oral)  Resp 22  SpO2 94% Physical Exam  Constitutional: He is oriented to person, place, and time. He appears well-developed and well-nourished.  HENT:  Head: Normocephalic and atraumatic.  Eyes: Conjunctivae and EOM are normal. Pupils are equal, round, and reactive to light. Right eye exhibits no discharge. Left eye exhibits no discharge. No scleral icterus.  Neck: Normal range of motion. Neck supple. No JVD present.  Cardiovascular: Normal heart sounds.  Exam reveals no gallop and no friction rub.  No murmur heard. Mildly tachycardic, irregularly irregular  Pulmonary/Chest: Effort normal and breath sounds normal. No respiratory distress. He has no wheezes. He has no rales. He exhibits no tenderness.  Abdominal: Soft. He exhibits no distension and no mass. There is no tenderness. There is no rebound and no guarding.  Musculoskeletal: Normal range of motion. He exhibits no edema or tenderness.  Neurological: He is alert and oriented to person,  place, and time.  Skin: Skin is warm and dry.  Psychiatric: He has a normal mood and affect. His behavior is normal. Judgment and thought content normal.  Nursing note and vitals reviewed.   ED Course  Procedures (including critical care time) Results for orders placed or performed during the hospital encounter of 12/12/15  Basic metabolic panel  Result Value Ref Range   Sodium 135 135 - 145 mmol/L   Potassium 3.9 3.5 - 5.1 mmol/L   Chloride 103 101 - 111 mmol/L   CO2 23 22 - 32 mmol/L   Glucose, Bld 85 65 - 99 mg/dL   BUN 12 6 - 20 mg/dL   Creatinine, Ser 1.61 (H) 0.61 - 1.24 mg/dL   Calcium 9.1 8.9 - 09.6 mg/dL   GFR calc non Af Amer 59 (L) >60 mL/min   GFR calc Af Amer >60 >60 mL/min   Anion gap 9 5 - 15  CBC  Result Value Ref Range   WBC 7.0 4.0 - 10.5 K/uL   RBC 4.64 4.22 - 5.81 MIL/uL   Hemoglobin 12.8 (L) 13.0 - 17.0 g/dL   HCT 04.5 (L) 40.9 - 81.1 %   MCV 82.8 78.0 - 100.0 fL   MCH 27.6 26.0 - 34.0 pg   MCHC 33.3 30.0 - 36.0 g/dL   RDW 91.4 78.2 - 95.6 %   Platelets 437 (H) 150 - 400 K/uL  Urinalysis, Routine w reflex microscopic  Result Value Ref Range   Color, Urine YELLOW YELLOW   APPearance CLEAR CLEAR   Specific Gravity, Urine 1.011 1.005 - 1.030   pH 5.5 5.0 - 8.0   Glucose, UA NEGATIVE NEGATIVE mg/dL   Hgb urine dipstick NEGATIVE NEGATIVE   Bilirubin Urine NEGATIVE NEGATIVE   Ketones, ur NEGATIVE NEGATIVE mg/dL   Protein, ur NEGATIVE NEGATIVE mg/dL   Nitrite NEGATIVE NEGATIVE   Leukocytes, UA NEGATIVE NEGATIVE  CBG monitoring, ED  Result Value Ref Range   Glucose-Capillary 87 65 - 99 mg/dL  CBG monitoring, ED  Result Value Ref Range   Glucose-Capillary 81 65 - 99 mg/dL  CBG monitoring, ED  Result Value Ref Range   Glucose-Capillary 71 65 - 99 mg/dL  I-stat troponin, ED  Result Value Ref Range   Troponin i, poc 0.00 0.00 - 0.08 ng/mL   Comment 3           Dg Chest 2 View  12/12/2015  CLINICAL DATA:  Syncope and cough. EXAM: CHEST  2 VIEW  COMPARISON:  December 10, 2015 FINDINGS: Stable cardiomegaly. The hila and mediastinum are unchanged. No pneumothorax. No pulmonary nodules or masses. Mild opacity and probable small effusions seen in left base common and inguinal. No other interval changes or acute abnormalities. IMPRESSION: Probable small left effusion with underlying opacity. Recommend follow-up to resolution. Electronically Signed   By: Gerome Sam III M.D   On: 12/12/2015 20:34   Dg Chest 2 View  12/10/2015  CLINICAL DATA:  Cough x1 week, fever, dizziness EXAM: CHEST  2 VIEW COMPARISON:  None. FINDINGS: Lungs are clear.  No  pleural effusion or pneumothorax. Cardiomegaly. Visualized osseous structures are within normal limits. IMPRESSION: No evidence of acute cardiopulmonary disease. Electronically Signed   By: Charline Bills M.D.   On: 12/10/2015 14:43    I have personally reviewed and evaluated these images and lab results as part of my medical decision-making.   EKG Interpretation   Date/Time:  Friday December 12 2015 16:28:53 EDT Ventricular Rate:  104 PR Interval:    QRS Duration: 70 QT Interval:  222 QTC Calculation: 291 R Axis:   94 Text Interpretation:  Atrial flutter with variable A-V block Rightward  axis Low voltage QRS Nonspecific T wave abnormality Abnormal ECG Since  last tracing Atrial flutter is new Confirmed by Effie Shy  MD, Mechele Collin ([id number])  on 12/12/2015 7:44:00 PM      MDM   Final diagnoses:  Syncope, unspecified syncope type  CAP (community acquired pneumonia)  Atrial flutter, unspecified type [facility])    Patient with 2 reported syncopal episodes, did not feel dizzy prior to passing out. He was coughing prior to passing out on both occasions. Will check labs, chest x-ray, and EKG. Will reassess.   New A-flutter on EKG.  CHA2DS2-VASc score is 2.  Given presentation of 2 syncopal episodes, new a flutter, advise admission. Patient discussed with Dr. Effie Shy, who agrees with plan.  Appreciate Dr. Katrinka Blazing  for admitting the patient for observation. Will start azithromycin and Rocephin for opacity seen on chest x-ray.    Roxy Horseman, PA-C 12/12/15 2136  Mancel Bale, MD 12/13/15 [phone]

## 2015-12-13 ENCOUNTER — Observation Stay (HOSPITAL_BASED_OUTPATIENT_CLINIC_OR_DEPARTMENT_OTHER): Payer: BC Managed Care – PPO

## 2015-12-13 ENCOUNTER — Observation Stay (HOSPITAL_COMMUNITY): Payer: BC Managed Care – PPO

## 2015-12-13 ENCOUNTER — Encounter (HOSPITAL_COMMUNITY): Payer: Self-pay | Admitting: Radiology

## 2015-12-13 DIAGNOSIS — I4892 Unspecified atrial flutter: Secondary | ICD-10-CM | POA: Diagnosis not present

## 2015-12-13 DIAGNOSIS — J181 Lobar pneumonia, unspecified organism: Secondary | ICD-10-CM | POA: Diagnosis not present

## 2015-12-13 DIAGNOSIS — I313 Pericardial effusion (noninflammatory): Secondary | ICD-10-CM

## 2015-12-13 DIAGNOSIS — I319 Disease of pericardium, unspecified: Secondary | ICD-10-CM

## 2015-12-13 DIAGNOSIS — E785 Hyperlipidemia, unspecified: Secondary | ICD-10-CM | POA: Diagnosis not present

## 2015-12-13 DIAGNOSIS — I481 Persistent atrial fibrillation: Secondary | ICD-10-CM | POA: Diagnosis not present

## 2015-12-13 DIAGNOSIS — I3139 Other pericardial effusion (noninflammatory): Secondary | ICD-10-CM

## 2015-12-13 DIAGNOSIS — E038 Other specified hypothyroidism: Secondary | ICD-10-CM

## 2015-12-13 DIAGNOSIS — I10 Essential (primary) hypertension: Secondary | ICD-10-CM | POA: Diagnosis not present

## 2015-12-13 DIAGNOSIS — R55 Syncope and collapse: Secondary | ICD-10-CM | POA: Diagnosis not present

## 2015-12-13 LAB — TROPONIN I
Troponin I: <0.03 ng/mL (ref <0.031)
Troponin I: <0.03 ng/mL (ref <0.031)

## 2015-12-13 LAB — ECHOCARDIOGRAM COMPLETE
CHL CUP MV DEC (S): 174
CHL CUP REG VEL DIAS: 95 cm/s
E decel time: 174 msec
EERAT: 7.86
FS: 39 % (ref 28–44)
HEIGHTINCHES: 75 in
IV/PV OW: 0.89
LA ID, A-P, ES: 26 mm
LA diam end sys: 26 mm
LA diam index: 1.18 cm/m2
LA vol A4C: 58.6 ml
LAVOL: 59.4 mL
LAVOLIN: 26.9 mL/m2
LVEEAVG: 7.86
LVEEMED: 7.86
LVELAT: 15.4 cm/s
LVOT area: 4.15 cm2
LVOT diameter: 23 mm
MV Peak grad: 6 mmHg
MVPKEVEL: 121 m/s
PW: 7.33 mm — AB (ref 0.6–1.1)
TAPSE: 21.2 mm
TDI e' lateral: 15.4
TDI e' medial: 13.3
WEIGHTICAEL: 3260.8 [oz_av]

## 2015-12-13 LAB — CBC
HCT: 35.7 % — ABNORMAL LOW (ref 39.0–52.0)
HEMOGLOBIN: 11.8 g/dL — AB (ref 13.0–17.0)
MCH: 27.4 pg (ref 26.0–34.0)
MCHC: 33.1 g/dL (ref 30.0–36.0)
MCV: 82.8 fL (ref 78.0–100.0)
PLATELETS: 432 10*3/uL — AB (ref 150–400)
RBC: 4.31 MIL/uL (ref 4.22–5.81)
RDW: 13.5 % (ref 11.5–15.5)
WBC: 6.6 10*3/uL (ref 4.0–10.5)

## 2015-12-13 LAB — BASIC METABOLIC PANEL
ANION GAP: 7 (ref 5–15)
BUN: 8 mg/dL (ref 6–20)
CO2: 27 mmol/L (ref 22–32)
CREATININE: 1.19 mg/dL (ref 0.61–1.24)
Calcium: 8.8 mg/dL — ABNORMAL LOW (ref 8.9–10.3)
Chloride: 103 mmol/L (ref 101–111)
GFR calc non Af Amer: >60 mL/min (ref >60)
Glucose, Bld: 96 mg/dL (ref 65–99)
POTASSIUM: 4 mmol/L (ref 3.5–5.1)
Sodium: 137 mmol/L (ref 135–145)

## 2015-12-13 LAB — GLUCOSE, CAPILLARY
GLUCOSE-CAPILLARY: 66 mg/dL (ref 65–99)
Glucose-Capillary: 115 mg/dL — ABNORMAL HIGH (ref 65–99)
Glucose-Capillary: 117 mg/dL — ABNORMAL HIGH (ref 65–99)
Glucose-Capillary: 123 mg/dL — ABNORMAL HIGH (ref 65–99)
Glucose-Capillary: 138 mg/dL — ABNORMAL HIGH (ref 65–99)
Glucose-Capillary: 47 mg/dL — ABNORMAL LOW (ref 65–99)
Glucose-Capillary: 86 mg/dL (ref 65–99)

## 2015-12-13 LAB — HEPARIN LEVEL (UNFRACTIONATED): HEPARIN UNFRACTIONATED: 0.2 [IU]/mL — AB (ref 0.30–0.70)

## 2015-12-13 MED ORDER — DEXTROSE 5 % IV SOLN
500.0000 mg | INTRAVENOUS | Status: DC
  Administered 2015-12-13 – 2015-12-15 (×3): 500 mg via INTRAVENOUS
  Filled 2015-12-13 (×4): qty 500

## 2015-12-13 MED ORDER — IOPAMIDOL (ISOVUE-370) INJECTION 76%
INTRAVENOUS | Status: AC
  Administered 2015-12-13: 60 mL
  Filled 2015-12-13: qty 100

## 2015-12-13 MED ORDER — DEXTROSE 5 % IV SOLN
2.0000 g | INTRAVENOUS | Status: DC
  Administered 2015-12-13 – 2015-12-15 (×3): 2 g via INTRAVENOUS
  Filled 2015-12-13 (×4): qty 2

## 2015-12-13 MED ORDER — SODIUM CHLORIDE 0.9 % IV SOLN
INTRAVENOUS | Status: DC
  Administered 2015-12-13 (×2): via INTRAVENOUS

## 2015-12-13 MED ORDER — INSULIN PUMP
Freq: Three times a day (TID) | SUBCUTANEOUS | Status: DC
  Administered 2015-12-13 – 2015-12-14 (×6): via SUBCUTANEOUS
  Administered 2015-12-14: 5.3 via SUBCUTANEOUS
  Administered 2015-12-14: 4 via SUBCUTANEOUS
  Administered 2015-12-15: 2 via SUBCUTANEOUS
  Administered 2015-12-15: 3.55 via SUBCUTANEOUS
  Filled 2015-12-13: qty 1

## 2015-12-13 MED ORDER — HEPARIN (PORCINE) IN NACL 100-0.45 UNIT/ML-% IJ SOLN
1850.0000 [IU]/h | INTRAMUSCULAR | Status: DC
  Administered 2015-12-13: 1400 [IU]/h via INTRAVENOUS
  Administered 2015-12-14: 1750 [IU]/h via INTRAVENOUS
  Administered 2015-12-14: 1700 [IU]/h via INTRAVENOUS
  Administered 2015-12-16: 1750 [IU]/h via INTRAVENOUS
  Filled 2015-12-13 (×5): qty 250

## 2015-12-13 MED ORDER — BENZONATATE 100 MG PO CAPS
200.0000 mg | ORAL_CAPSULE | Freq: Two times a day (BID) | ORAL | Status: DC | PRN
  Administered 2015-12-13 – 2015-12-14 (×4): 200 mg via ORAL
  Filled 2015-12-13 (×5): qty 2

## 2015-12-13 NOTE — Progress Notes (Signed)
Pt ddimer 3.98, NP notified.  RN will continue to monitor pt closely.  Erenest Rasheraldwell,Allysha Tryon B, RN

## 2015-12-13 NOTE — Progress Notes (Addendum)
Patient ID: Tom Baker, male   DOB: 06/23/1956, 60 y.o.   MRN: [id number]  PROGRESS NOTE    Tom Baker  WUJ:[id number] DOB: Oct 19, 1955 DOA: 12/12/2015  PCP: Laurena Slimmer, MD   Brief Narrative:  60 y.o. male with past medical history significant for HTN, HLD, diabetes mellitus type 1, hypothyroidism who presented to [facility] ED status post syncopal event which is the 2nd one in past week. The firs syncopal event occurred while he was standing, no exertion other than coughing. The 2nd episode happened as he was coughing. He has had upper respiratory tract infection for past week or so. He saw his PCP and was given an abx for possible bronchitis.  Pt was hemodynamically stable on admission. He was found to have moderate pericardial effusion on CT scan Seen by cardio in consultation. He was started on IV Rocephin and azithromycin empirically for possible pneumonia.   Assessment & Plan:   Principal Problem:   Syncope / pericardial effusion - Appreciate cardio input - Moderate effusion on CT scan and 2 D ECHO (bedside prelim findings) - Repeat echo on Monday - Till then heparin drip without bolus  Active Problems:   Possible lobar pneumonia - Started on empiric abx, azithro and Rocephin - Blood cultures not obtained on admission    Hypertension, essential - On norvasc and losartan    Hypothyroidism - Continue synthroid    Diabetes mellitus type 1 without complications with long term insulin use (HCC) - Continue insulin pump     Dyslipidemia associated with DM - Continue stain therapy    Atrial flutter (HCC) - New onset - On heparin drip - Cardio following    DVT prophylaxis: on heparin drip  Code Status: full code  Family Communication: wife at the bedside this am  Disposition Plan: plan for repeat echo on Monday, then will have a better idea when he will be stable for discharge    Consultants:   Cardiology   Procedures:   2 D ECHO 12/13/2015 - pending    Antimicrobials:   Azithromycin and rocephin 12/12/2015 -->   Subjective: No overnight events.   Objective: Filed Vitals:   12/12/15 2200 12/12/15 2226 12/13/15 0453 12/13/15 0629  BP: 119/67 121/55 114/58   Pulse: 86 107 99   Temp:  99.2 F (37.3 C) 99.1 F (37.3 C)   TempSrc:  Oral Oral   Resp: Height:   (1.905 m)    Weight:  96.026 kg (211 lb 11.2 oz)  92.443 kg (203 lb 12.8 oz)  SpO2: 94% 98% 96%    No intake or output data in the 24 hours ending 12/13/15 0930 Filed Weights   12/12/15 2226 12/13/15 0629  Weight: 96.026 kg (211 lb 11.2 oz) 92.443 kg (203 lb 12.8 oz)    Examination:  General exam: Appears calm and comfortable  Respiratory system: Clear to auscultation. Respiratory effort normal. Cardiovascular system: S1 & S2 heard, tachycardia Gastrointestinal system: Abdomen is nondistended, soft and nontender. No organomegaly or masses felt. Normal bowel sounds heard. Central nervous system: Alert and oriented. No focal neurological deficits. Extremities: Symmetric 5 x 5 power. Skin: No rashes, lesions or ulcers Psychiatry: Judgement and insight appear normal. Mood & affect appropriate.   Data Reviewed: I have personally reviewed following labs and imaging studies  CBC:  Recent Labs Lab 12/12/15 1646 12/13/15 0329  WBC 7.0 6.6  HGB 12.8* 11.8*  HCT 38.4* 35.7*  MCV 82.8 82.8  PLT 437* 432*  Basic Metabolic Panel:  Recent Labs Lab 12/12/15 1646 12/13/15 0329  NA 135 137  K 3.9 4.0  CL 103 103  CO2 23 27  GLUCOSE 85 96  BUN 12 8  CREATININE 1.29* 1.19  CALCIUM 9.1 8.8*   GFR: Estimated Creatinine Clearance: 78.9 mL/min (by C-G formula based on Cr of 1.19). Liver Function Tests: No results for input(s): AST, ALT, ALKPHOS, BILITOT, PROT, ALBUMIN in the last 168 hours. No results for input(s): LIPASE, AMYLASE in the last 168 hours. No results for input(s): AMMONIA in the last 168 hours. Coagulation Profile: No results for  input(s): INR, PROTIME in the last 168 hours. Cardiac Enzymes:  Recent Labs Lab 12/12/15 2148 12/13/15 0329  TROPONINI <0.03 <0.03   BNP (last 3 results) No results for input(s): PROBNP in the last 8760 hours. HbA1C: No results for input(s): HGBA1C in the last 72 hours. CBG:  Recent Labs Lab 12/12/15 1842 12/12/15 2253 12/13/15 0037 12/13/15 0622 12/13/15 0830  GLUCAP 71 96 117* 66 86   Lipid Profile: No results for input(s): CHOL, HDL, LDLCALC, TRIG, CHOLHDL, LDLDIRECT in the last 72 hours. Thyroid Function Tests:  Recent Labs  12/12/15 2148  TSH 2.745   Anemia Panel: No results for input(s): VITAMINB12, FOLATE, FERRITIN, TIBC, IRON, RETICCTPCT in the last 72 hours. Urine analysis:    Component Value Date/Time   COLORURINE YELLOW 12/12/2015 1750   APPEARANCEUR CLEAR 12/12/2015 1750   LABSPEC 1.011 12/12/2015 1750   PHURINE 5.5 12/12/2015 1750   GLUCOSEU NEGATIVE 12/12/2015 1750   HGBUR NEGATIVE 12/12/2015 1750   BILIRUBINUR NEGATIVE 12/12/2015 1750   KETONESUR NEGATIVE 12/12/2015 1750   PROTEINUR NEGATIVE 12/12/2015 1750   NITRITE NEGATIVE 12/12/2015 1750   LEUKOCYTESUR NEGATIVE 12/12/2015 1750   Sepsis Labs: @LABRCNTIP (procalcitonin:4,lacticidven:4)   )No results found for this or any previous visit (from the past 240 hour(s)).    Radiology Studies: Dg Chest 2 View 12/12/2015 Probable small left effusion with underlying opacity. Recommend follow-up to resolution.   Dg Chest 2 View 12/10/2015  No evidence of acute cardiopulmonary disease.   Ct Angio Chest Pe W/cm &/or Wo Cm 12/13/2015  No CT evidence of pulmonary embolism. Moderate size pericardial effusion. Correlation with clinical exam and echocardiogram recommended. Small bilateral pleural effusions with congestive changes. Superimposed pneumonia is not excluded.   Scheduled Meds: . amLODipine  10 mg Oral Daily  . atorvastatin  10 mg Oral Daily  . azithromycin  500 mg Intravenous Q24H  .  cefTRIAXone (ROCEPHIN)  IV  2 g Intravenous Q24H  . cholecalciferol  2,000 Units Oral Daily  . insulin pump   Subcutaneous TID AC, HS, 0200  . levothyroxine  125 mcg Oral QAC breakfast  . losartan  100 mg Oral Daily  . multivitamin with minerals  1 tablet Oral Daily   Continuous Infusions: . sodium chloride 100 mL/hr at 12/13/15 0131       Time spent: 25 minutes  Greater than 50% of the time spent on counseling and coordinating the care.   Manson PasseyEVINE, Taja Pentland, MD [facility] Pager [phone]  If 7PM-7AM, please contact night-coverage [email or web address] Password [email or web address] 12/13/2015, 9:30 AM

## 2015-12-13 NOTE — Progress Notes (Signed)
Patient ID: Tom Baker, male   DOB: 03/20/1956, 60 y.o.   MRN: [id number] Patient seen by Dr Antoine PocheHochrein early this am Large pericardial effusion by CT Admitted with syncope and new onset afib Reviewed CT and aorta is fine Moderate effusion  Patient is comfortable in rate controlled afib Bedside echo shows moderate pericardial effusion Most of the effusion is posterior and would not be easily Drained percutaneously IVC is not dilated and collapses with sniff test  No pulsus on my exam No rub Lungs clear  Discussed with primary service Would start heparin with no bolus Repeat echo Monday if effusion bigger will need Consideration for window.   Patient and wife understand Rx plan  CC time spent 30 minutes including bedside echo.  Charlton HawsPeter Jakaria Lavergne

## 2015-12-13 NOTE — Progress Notes (Addendum)
ANTICOAGULATION CONSULT NOTE Pharmacy Consult for heparin (no bolus) Indication: atrial fibrillation  No Known Allergies  Patient Measurements: TBW 98.4 kg  Vital Signs: Temp: 99.1 F (37.3 C) (06/10 0453) Temp Source: Oral (06/10 0453) BP: 114/64 mmHg (06/10 1009) Pulse Rate: 99 (06/10 0453)  Labs:  Recent Labs  12/12/15 1646 12/12/15 2148 12/13/15 0329 12/13/15 0958  HGB 12.8*  --  11.8*  --   HCT 38.4*  --  35.7*  --   PLT 437*  --  432*  --   CREATININE 1.29*  --  1.19  --   TROPONINI  --  <0.03 <0.03 <0.03    Estimated Creatinine Clearance: 78.9 mL/min (by C-G formula based on Cr of 1.19).   Medical History: Past Medical History  Diagnosis Date  . Diabetes mellitus type 1 (HCC)   . Hypertension   . Hyperlipidemia   . Hypothyroid     Assessment: 60 yo M presents on 6/9 with large pericardial effusion. Also noted to be in Afib. No prior history of Afib. Pharmacy consulted to start heparin for Afib with no bolus. Previously on treatment-dose lovenox, last dose at ~2300 last night. Cards recommends heparin drip with possible need for window if pericardial effusion larger on Monday. No AC pta. Hg 11.8 down, plt ok. No bleed documented.  Goal of Therapy:  HL 0.3-0.7 Monitor platelets by anticoagulation protocol: Yes   Plan:  Start heparin at 1400 units/h (no bolus) 6h HL, daily HL/CBC Monitor s/sx bleeding F/u Cards plans  Babs BertinHaley Millenia Waldvogel, PharmD, BCPS Clinical Pharmacist Pager [phone] 12/13/2015 12:47 PM   ADDENDUM:  Initial HL low (0.2) on 1400 units/h. No bleed/IV line issues per RN.  Plan: Increase heparin to 1600 units/h 6h HL, daily HL/CBC Monitor s/sx bleeding   Babs BertinHaley Claus Silvestro, PharmD, Tric[facility] Endoscopy Center PcBCPS Clinical Pharmacist Pager [phone] 12/13/2015 8:06 PM

## 2015-12-13 NOTE — Progress Notes (Signed)
*  PRELIMINARY RESULTS* Echocardiogram 2D Echocardiogram has been performed.  Tom Baker 12/13/2015, 8:28 AM

## 2015-12-13 NOTE — Progress Notes (Addendum)
Notified by radiology of CT results.  MD Katrinka BlazingSmith notified and verbally acknowledged results.  RN will continue to monitor.  Erenest Rasheraldwell,Adilenne Ashworth B, RN

## 2015-12-13 NOTE — Consult Note (Addendum)
CARDIOLOGY CONSULT NOTE  Patient ID: Tom Baker MRN: [id number] DOB/AGE: 1955/11/30 60 y.o.  Admit date: 12/12/2015 Primary Physician Laurena SlimmerLARK,PRESTON S, MD Primary Cardiologist Dr. Duke Salviaandolph Chief Complaint  Syncope Requesting  Dr. Katrinka BlazingSmith  HPI:  The patient presented with syncope.   He had two episodes of this.  Both happened with coughing.  One happened when he was coughing at work.  He was sitting and stood up and had a brief syncopal spell.  The second episode happened yesterday while he was coughing while seated.  Both time she was only out for a few seconds.  He felt well before and after.  Of note he has had a "bronchial" infection for several days with some cough non productive.  A few weeks ago he had some chest pain that was thought to be esophageal spasm.  He saw Dr. Duke Salviaandolph in our practice recently because of risk factors including diabetes.  He had a Lexiscan Myoview that was negative for ischemia and echo was pending.  He reports no significant recent symptoms other than cough.  The patient denies any new symptoms such as chest discomfort, neck or arm discomfort. There has been no new shortness of breath, PND or orthopnea. There have been no reported palpitations, presyncope or syncope.  In the ED the D dimer was elevated.  He has mild anemia.  CT demonstrated a moderate sized pericardial effusion.  He was found to be in atrial fib.  This is a new diagnosis for him.   I was asked to consult to rule out tamponade.  Echocardiogram is pending.  The patient has not been hypotension.  He has had no SOB.   Past Medical History  Diagnosis Date  . Diabetes mellitus type 1 (HCC)   . Hypertension   . Hyperlipidemia   . Hypothyroid     History reviewed. No pertinent past surgical history.  No Known Allergies Prescriptions prior to admission  Medication Sig Dispense Refill Last Dose  . amLODipine (NORVASC) 10 MG tablet Take 10 mg by mouth daily.   12/12/2015 at Unknown time  . atorvastatin  (LIPITOR) 10 MG tablet Take 10 mg by mouth daily.   12/12/2015 at Unknown time  . Cholecalciferol (VITAMIN D3) 2000 units TABS Take 1 tablet by mouth daily.   Past Week at Unknown time  . Insulin Infusion Pump DEVI by Does not apply route. Novolog Insulin   12/12/2015 at Unknown time  . levothyroxine (SYNTHROID, LEVOTHROID) 125 MCG tablet Take 125 mcg by mouth daily.     12/12/2015 at Unknown time  . losartan (COZAAR) 100 MG tablet Take 100 mg by mouth daily.     12/12/2015 at Unknown time  . Multiple Vitamin (MULTIVITAMIN) tablet Take 1 tablet by mouth daily.   Past Week at Unknown time  . PRESCRIPTION MEDICATION Medication Olive oil : 30ml by mouth daily   12/12/2015 at Unknown time   History reviewed. No pertinent family history.  Social History   Social History  . Marital Status: Married    Spouse Name: N/A  . Number of Children: N/A  . Years of Education: N/A   Occupational History  . Not on file.   Social History Main Topics  . Smoking status: Never Smoker   . Smokeless tobacco: Not on file  . Alcohol Use: No     Comment: last used 1992  . Drug Use: No  . Sexual Activity: Not on file   Other Topics Concern  . Not on file  Social History Narrative     ROS:    As stated in the HPI and negative for all other systems.  Physical Exam: Blood pressure 121/55, pulse 107, temperature 99.2 F (37.3 C), temperature source Oral, resp. rate 22, height  (1.905 m), weight 211 lb 11.2 oz (96.026 kg), SpO2 98 %.   No pulsus paradox GENERAL:  Well appearing HEENT:  Pupils equal round and reactive, fundi not visualized, oral mucosa unremarkable NECK:  No jugular venous distention, waveform within normal limits, carotid upstroke brisk and symmetric, no bruits, no thyromegaly LYMPHATICS:  No cervical, inguinal adenopathy LUNGS:  Clear to auscultation bilaterally BACK:  No CVA tenderness CHEST:  Unremarkable HEART:  PMI not displaced or sustained,S1 and S2 within normal limits, no S3, no  S4, no clicks, no rubs, no murmurs ABD:  Flat, positive bowel sounds normal in frequency in pitch, no bruits, no rebound, no guarding, no midline pulsatile mass, no hepatomegaly, no splenomegaly EXT:  2 plus pulses throughout, no edema, no cyanosis no clubbing SKIN:  No rashes no nodules NEURO:  Cranial nerves II through XII grossly intact, motor grossly intact throughout PSYCH:  Cognitively intact, oriented to person place and time   Labs: Lab Results  Component Value Date   BUN 8 12/13/2015   Lab Results  Component Value Date   CREATININE 1.19 12/13/2015   Lab Results  Component Value Date   NA 137 12/13/2015   K 4.0 12/13/2015   CL 103 12/13/2015   CO2 27 12/13/2015   Lab Results  Component Value Date   TROPONINI <0.03 12/13/2015   Lab Results  Component Value Date   WBC 6.6 12/13/2015   HGB 11.8* 12/13/2015   HCT 35.7* 12/13/2015   MCV 82.8 12/13/2015   PLT 432* 12/13/2015     Radiology:   CT: There small bilateral pleural effusions, left greater than right. There is diffuse interstitial prominence and airspace densities, likely congestive changes or edema. Superimposed pneumonia is not excluded. Bibasilar subsegmental compressive atelectasis. There is no pneumothorax. The central airways are patent.  The thoracic aorta appears unremarkable. The origins of the great vessels of the aortic arch appear patent. Top-normal cardiac size. There is coronary vascular calcification. There is a moderate size pericardial effusion measuring up to 2.5 cm in greatest transverse axial dimension. Correlation with clinical exam and echocardiogram recommended. There is no CT evidence of pulmonary embolism.  Top-normal bilateral hilar lymph nodes, likely reactive. Esophagus is grossly unremarkable.  There is no axillary adenopathy. The chest wall soft tissues appear unremarkable. There is mild degenerative changes of the spine. No acute fracture.   EKG:  Atrial  flutter/fib rate 103, RAD, no acute ST T wave changes.   ASSESSMENT AND PLAN:   PERICARDIAL EFFUSION:  I reviewed the CT there is a moderate circumferential effusion.  However, he does not have a significant pulsus paradox on exam.  He is not SOB or hypotensive.  He does not have a clinical picture of tamponade.  We will get an echo in the AM.  TSH was normal.  Consider PPD.  This could be related to a recent or ongoing URI/pneumonia.    ATRIAL FIB:  This could be related to any ongoing pericardial inflammation he might have had with recent respiratory process.   Mr. Gearl Baratta has a CHA2DS2 - VASc score of 2 with a risk of stroke of 2.2%.  He would need anticoagulation particularly since he might need DCCV in the weeks to come.  However, I would wait to start this until we have further quantified and worked up the pericardial effusion.  Prior to discharge consider changing to Cardizem and stopping Norvasc for rate control.  Onset of atrial fib is sometime between now and his appt with Dr. Duke Salvia.  He was in sinus at that time.   SYNCOPE:  This is likely to be cough syncope and vagal.  This response could be exacerbated by relatively lower blood pressures related to his pericardial effusion.  However, it is likely unrelated.  In this situation treatment of the cough is the primary issue.     SignedRollene Rotunda 12/13/2015, 4:38 AM

## 2015-12-13 NOTE — Progress Notes (Signed)
Pharmacy Antibiotic Note  Tom Baker is a 60 y.o. male admitted on 12/12/2015 with pneumonia.  Pharmacy has been consulted for Ceftriaxone dosing.  Plan: -Ceftriaxone 2g IV q24h -Trend WBC, temp  Height: 6\' 3"  (190.5 cm) Weight: 211 lb 11.2 oz (96.026 kg) IBW/kg (Calculated) : 84.5  Temp (24hrs), Avg:99.4 F (37.4 C), Min:99.1 F (37.3 C), Max:99.8 F (37.7 C)   Recent Labs Lab 12/12/15 1646  WBC 7.0  CREATININE 1.29*    Estimated Creatinine Clearance: 72.8 mL/min (by C-G formula based on Cr of 1.29).    No Known Allergies   Abran DukeLedford, Abas Leicht 12/13/2015 2:15 AM

## 2015-12-14 DIAGNOSIS — I10 Essential (primary) hypertension: Secondary | ICD-10-CM

## 2015-12-14 DIAGNOSIS — I483 Typical atrial flutter: Secondary | ICD-10-CM | POA: Diagnosis not present

## 2015-12-14 DIAGNOSIS — R55 Syncope and collapse: Secondary | ICD-10-CM | POA: Diagnosis not present

## 2015-12-14 DIAGNOSIS — J181 Lobar pneumonia, unspecified organism: Principal | ICD-10-CM | POA: Insufficient documentation

## 2015-12-14 DIAGNOSIS — I319 Disease of pericardium, unspecified: Secondary | ICD-10-CM | POA: Diagnosis not present

## 2015-12-14 DIAGNOSIS — I4892 Unspecified atrial flutter: Secondary | ICD-10-CM | POA: Diagnosis not present

## 2015-12-14 DIAGNOSIS — E038 Other specified hypothyroidism: Secondary | ICD-10-CM | POA: Diagnosis not present

## 2015-12-14 LAB — GLUCOSE, CAPILLARY
GLUCOSE-CAPILLARY: 60 mg/dL — AB (ref 65–99)
GLUCOSE-CAPILLARY: 112 mg/dL — AB (ref 65–99)
GLUCOSE-CAPILLARY: 99 mg/dL (ref 65–99)
GLUCOSE-CAPILLARY: 78 mg/dL (ref 65–99)
Glucose-Capillary: 47 mg/dL — ABNORMAL LOW (ref 65–99)
Glucose-Capillary: 87 mg/dL (ref 65–99)
Glucose-Capillary: 147 mg/dL — ABNORMAL HIGH (ref 65–99)
Glucose-Capillary: 137 mg/dL — ABNORMAL HIGH (ref 65–99)
Glucose-Capillary: 99 mg/dL (ref 65–99)

## 2015-12-14 LAB — HEPARIN LEVEL (UNFRACTIONATED)
Heparin Unfractionated: 0.29 IU/mL — ABNORMAL LOW (ref 0.30–0.70)
Heparin Unfractionated: 0.32 IU/mL (ref 0.30–0.70)
Heparin Unfractionated: 0.45 IU/mL (ref 0.30–0.70)

## 2015-12-14 LAB — CBC
HEMATOCRIT: 38.5 % — AB (ref 39.0–52.0)
Hemoglobin: 12.9 g/dL — ABNORMAL LOW (ref 13.0–17.0)
MCH: 27.8 pg (ref 26.0–34.0)
MCHC: 33.5 g/dL (ref 30.0–36.0)
MCV: 83 fL (ref 78.0–100.0)
PLATELETS: 450 10*3/uL — AB (ref 150–400)
RBC: 4.64 MIL/uL (ref 4.22–5.81)
RDW: 13.8 % (ref 11.5–15.5)
WBC: 8.1 10*3/uL (ref 4.0–10.5)

## 2015-12-14 LAB — TSH: TSH: 2.288 u[IU]/mL (ref 0.350–4.500)

## 2015-12-14 NOTE — Progress Notes (Signed)
ANTICOAGULATION CONSULT NOTE - Follow Up Consult  Pharmacy Consult for Heparin  Indication: atrial fibrillation  No Known Allergies  Patient Measurements: Height: 6\' 3"  (190.5 cm) Weight: 203 lb 12.8 oz (92.443 kg) IBW/kg (Calculated) : 84.5  Vital Signs: Temp: 98.8 F (37.1 C) (06/10 2114) Temp Source: Oral (06/10 2114) BP: 108/52 mmHg (06/10 2114) Pulse Rate: 79 (06/10 2114)  Labs:  Recent Labs  12/12/15 1646 12/12/15 2148 12/13/15 0329 12/13/15 0958 12/13/15 1920 12/14/15 0215  HGB 12.8*  --  11.8*  --   --  12.9*  HCT 38.4*  --  35.7*  --   --  38.5*  PLT 437*  --  432*  --   --  450*  HEPARINUNFRC  --   --   --   --  0.20* 0.29*  CREATININE 1.29*  --  1.19  --   --   --   TROPONINI  --  <0.03 <0.03 <0.03  --   --     Estimated Creatinine Clearance: 78.9 mL/min (by C-G formula based on Cr of 1.19).  Assessment: On heparin for afib, also noted to have peri-cardial effusion, Hgb stable this AM, HL is slightly low, no issues per RN.   Goal of Therapy:  Heparin level 0.3-0.7 units/ml Monitor platelets by anticoagulation protocol: Yes   Plan:  -Increase heparin to 1700 units/hr -1100  HL  Abran DukeLedford, Yamile Roedl 12/14/2015,3:07 AM

## 2015-12-14 NOTE — Progress Notes (Signed)
Patient ID: Tom Baker, male   DOB: 1955-08-08, 60 y.o.   MRN: [id number]  PROGRESS NOTE    [place]  HQI:[id number] DOB: 1956/02/22 DOA: 12/12/2015  PCP: Laurena Slimmer, MD   Brief Narrative:  60 y.o. male with past medical history significant for HTN, HLD, diabetes mellitus type 1, hypothyroidism who presented to [facility] ED status post syncopal event which is the 2nd one in past week. The firs syncopal event occurred while he was standing, no exertion other than coughing. The 2nd episode happened as he was coughing. He has had upper respiratory tract infection for past week or so. He saw his PCP and was given an abx for possible bronchitis.  Pt was hemodynamically stable on admission. He was found to have moderate pericardial effusion on CT scan Seen by cardio in consultation. He was started on IV Rocephin and azithromycin empirically for possible pneumonia.   Assessment & Plan:   Principal Problem:   Syncope / pericardial effusion - Moderate effusion on CT scan and 2 D ECHO  - Repeat echo on Monday - Continue heparin drip   Active Problems:   Possible lobar pneumonia - Continue azithro and Rocephin - Blood cultures not obtained on admission    Hypertension, essential - On norvasc and losartan    Hypothyroidism - Continue synthroid - Check TSH level, did not see one ordered on admission     Diabetes mellitus type 1 without complications with long term insulin use (HCC) - Continue insulin pump     Dyslipidemia associated with DM - Continue stain therapy    Atrial flutter, atrial fibrillation (HCC) - CHADS vasc score 2  - On heparin drip - Cardio following    DVT prophylaxis: on heparin drip  Code Status: full code  Family Communication: wife at the bedside 6/10 Disposition Plan: plan for repeat echo on Monday, then will have a better idea when he will be stable for discharge    Consultants:   Cardiology   Procedures:   2 D ECHO 12/13/2015 - EF 60%, moderate  pericardial effusion mostly posteriorly  Antimicrobials:   Azithromycin and rocephin 12/12/2015 -->   Subjective: No overnight events.   Objective: Filed Vitals:   12/13/15 1009 12/13/15 1615 12/13/15 2114 12/14/15 0434  BP: 114/64 119/61 108/52 109/60  Pulse:  97 79 78  Temp:  98.4 F (36.9 C) 98.8 F (37.1 C) 98.6 F (37 C)  TempSrc:  Oral Oral Oral  Resp:  90 18 18  Height:      Weight:    97.206 kg (214 lb 4.8 oz)  SpO2:  97% 100% 99%    Intake/Output Summary (Last 24 hours) at 12/14/15 1153 Last data filed at 12/14/15 0900  Gross per 24 hour  Intake   1080 ml  Output      0 ml  Net   1080 ml   Filed Weights   12/12/15 2226 12/13/15 0629 12/14/15 0434  Weight: 96.026 kg (211 lb 11.2 oz) 92.443 kg (203 lb 12.8 oz) 97.206 kg (214 lb 4.8 oz)    Examination:  General exam: Appears in no distress Respiratory system: no wheezing, no rhonchi Cardiovascular system: S1 & S2 (+), rate controlled, rhythm irregular Gastrointestinal system: (+) BS, non tender  Central nervous system: No focal neurological deficits. Extremities: no edema, no tenderness  Skin: warm, dry  Psychiatry: Normal mood and effect   Data Reviewed: I have personally reviewed following labs and imaging studies  CBC:  Recent Labs Lab 12/12/15  1646 12/13/15 0329 12/14/15 0215  WBC 7.0 6.6 8.1  HGB 12.8* 11.8* 12.9*  HCT 38.4* 35.7* 38.5*  MCV 82.8 82.8 83.0  PLT 437* 432* 450*   Basic Metabolic Panel:  Recent Labs Lab 12/12/15 1646 12/13/15 0329  NA 135 137  K 3.9 4.0  CL 103 103  CO2 23 27  GLUCOSE 85 96  BUN 12 8  CREATININE 1.29* 1.19  CALCIUM 9.1 8.8*   GFR: Estimated Creatinine Clearance: 78.9 mL/min (by C-G formula based on Cr of 1.19). Liver Function Tests: No results for input(s): AST, ALT, ALKPHOS, BILITOT, PROT, ALBUMIN in the last 168 hours. No results for input(s): LIPASE, AMYLASE in the last 168 hours. No results for input(s): AMMONIA in the last 168  hours. Coagulation Profile: No results for input(s): INR, PROTIME in the last 168 hours. Cardiac Enzymes:  Recent Labs Lab 12/12/15 2148 12/13/15 0329 12/13/15 0958  TROPONINI <0.03 <0.03 <0.03   BNP (last 3 results) No results for input(s): PROBNP in the last 8760 hours. HbA1C: No results for input(s): HGBA1C in the last 72 hours. CBG:  Recent Labs Lab 12/14/15 0405 12/14/15 0431 12/14/15 0618 12/14/15 0857 12/14/15 1114  GLUCAP 47* 87 147* 112* 99   Lipid Profile: No results for input(s): CHOL, HDL, LDLCALC, TRIG, CHOLHDL, LDLDIRECT in the last 72 hours. Thyroid Function Tests:  Recent Labs  12/12/15 2148  TSH 2.745   Anemia Panel: No results for input(s): VITAMINB12, FOLATE, FERRITIN, TIBC, IRON, RETICCTPCT in the last 72 hours. Urine analysis:    Component Value Date/Time   COLORURINE YELLOW 12/12/2015 1750   APPEARANCEUR CLEAR 12/12/2015 1750   LABSPEC 1.011 12/12/2015 1750   PHURINE 5.5 12/12/2015 1750   GLUCOSEU NEGATIVE 12/12/2015 1750   HGBUR NEGATIVE 12/12/2015 1750   BILIRUBINUR NEGATIVE 12/12/2015 1750   KETONESUR NEGATIVE 12/12/2015 1750   PROTEINUR NEGATIVE 12/12/2015 1750   NITRITE NEGATIVE 12/12/2015 1750   LEUKOCYTESUR NEGATIVE 12/12/2015 1750   Sepsis Labs: @LABRCNTIP (procalcitonin:4,lacticidven:4)   )No results found for this or any previous visit (from the past 240 hour(s)).    Radiology Studies: Dg Chest 2 View 12/12/2015 Probable small left effusion with underlying opacity. Recommend follow-up to resolution.   Dg Chest 2 View 12/10/2015  No evidence of acute cardiopulmonary disease.   Ct Angio Chest Pe W/cm &/or Wo Cm 12/13/2015  No CT evidence of pulmonary embolism. Moderate size pericardial effusion. Correlation with clinical exam and echocardiogram recommended. Small bilateral pleural effusions with congestive changes. Superimposed pneumonia is not excluded.   Scheduled Meds: . amLODipine  10 mg Oral Daily  . atorvastatin  10  mg Oral Daily  . azithromycin  500 mg Intravenous Q24H  . cefTRIAXone (ROCEPHIN)  IV  2 g Intravenous Q24H  . cholecalciferol  2,000 Units Oral Daily  . insulin pump   Subcutaneous TID AC, HS, 0200  . levothyroxine  125 mcg Oral QAC breakfast  . losartan  100 mg Oral Daily  . multivitamin with minerals  1 tablet Oral Daily   Continuous Infusions: . sodium chloride 100 mL/hr at 12/13/15 1253  . heparin 1,700 Units/hr (12/14/15 1000)       Time spent: 15 minutes  Greater than 50% of the time spent on counseling and coordinating the care.   Manson PasseyEVINE, Danaysha Kirn, MD [facility] Pager [phone]  If 7PM-7AM, please contact night-coverage [email or web address] Password [email or web address] 12/14/2015, 11:53 AM

## 2015-12-14 NOTE — Progress Notes (Signed)
SUBJECTIVE:  Denies SOB or CP.  Does have a cough  OBJECTIVE:   Vitals:   Filed Vitals:   12/13/15 1009 12/13/15 1615 12/13/15 2114 12/14/15 0434  BP: 114/64 119/61 108/52 109/60  Pulse:  97 79 78  Temp:  98.4 F (36.9 C) 98.8 F (37.1 C) 98.6 F (37 C)  TempSrc:  Oral Oral Oral  Resp:  90 18 18  Height:      Weight:    214 lb 4.8 oz (97.206 kg)  SpO2:  97% 100% 99%   I&O's:   Intake/Output Summary (Last 24 hours) at 12/14/15 0837 Last data filed at 12/13/15 1729  Gross per 24 hour  Intake    960 ml  Output      0 ml  Net    960 ml   TELEMETRY: Reviewed telemetry pt in atrial fibrillation:     PHYSICAL EXAM General: Well developed, well nourished, in no acute distress Head: Eyes PERRLA, No xanthomas.   Normal cephalic and atramatic  Lungs:   Clear bilaterally to auscultation and percussion. Heart:   Irregularly irregular S1 S2 Pulses are 2+ & equal. Abdomen: Bowel sounds are positive, abdomen soft and non-tender without masses  Extremities:   No clubbing, cyanosis or edema.  DP +1 Neuro: Alert and oriented X 3. Psych:  Good affect, responds appropriately   LABS: Basic Metabolic Panel:  Recent Labs  09/81/1905/03/21 1646 12/13/15 0329  NA 135 137  K 3.9 4.0  CL 103 103  CO2 23 27  GLUCOSE 85 96  BUN 12 8  CREATININE 1.29* 1.19  CALCIUM 9.1 8.8*   Liver Function Tests: No results for input(s): AST, ALT, ALKPHOS, BILITOT, PROT, ALBUMIN in the last 72 hours. No results for input(s): LIPASE, AMYLASE in the last 72 hours. CBC:  Recent Labs  12/13/15 0329 12/14/15 0215  WBC 6.6 8.1  HGB 11.8* 12.9*  HCT 35.7* 38.5*  MCV 82.8 83.0  PLT 432* 450*   Cardiac Enzymes:  Recent Labs  12/12/15 2148 12/13/15 0329 12/13/15 0958  TROPONINI <0.03 <0.03 <0.03   BNP: Invalid input(s): POCBNP D-Dimer:  Recent Labs  12/12/15 2148  DDIMER 3.98*   Hemoglobin A1C: No results for input(s): HGBA1C in the last 72 hours. Fasting Lipid Panel: No results for  input(s): CHOL, HDL, LDLCALC, TRIG, CHOLHDL, LDLDIRECT in the last 72 hours. Thyroid Function Tests:  Recent Labs  12/12/15 2148  TSH 2.745   Anemia Panel: No results for input(s): VITAMINB12, FOLATE, FERRITIN, TIBC, IRON, RETICCTPCT in the last 72 hours. Coag Panel:   No results found for: INR, PROTIME  RADIOLOGY: Dg Chest 2 View  12/12/2015  CLINICAL DATA:  Syncope and cough. EXAM: CHEST  2 VIEW COMPARISON:  December 10, 2015 FINDINGS: Stable cardiomegaly. The hila and mediastinum are unchanged. No pneumothorax. No pulmonary nodules or masses. Mild opacity and probable small effusions seen in left base common and inguinal. No other interval changes or acute abnormalities. IMPRESSION: Probable small left effusion with underlying opacity. Recommend follow-up to resolution. Electronically Signed   By: Gerome Samavid  Williams III M.D   On: 12/12/2015 20:34   Dg Chest 2 View  12/10/2015  CLINICAL DATA:  Cough x1 week, fever, dizziness EXAM: CHEST  2 VIEW COMPARISON:  None. FINDINGS: Lungs are clear.  No pleural effusion or pneumothorax. Cardiomegaly. Visualized osseous structures are within normal limits. IMPRESSION: No evidence of acute cardiopulmonary disease. Electronically Signed   By: Charline BillsSriyesh  Krishnan M.D.   On: 12/10/2015 14:43  Ct Angio Chest Pe W/cm &/or Wo Cm  12/13/2015  CLINICAL DATA:  60 year old male with elevated D-dimer, syncope, and collapse. EXAM: CT ANGIOGRAPHY CHEST WITH CONTRAST TECHNIQUE: Multidetector CT imaging of the chest was performed using the standard protocol during bolus administration of intravenous contrast. Multiplanar CT image reconstructions and MIPs were obtained to evaluate the vascular anatomy. CONTRAST:  60 cc Isovue 370 COMPARISON:  Chest radiograph dated 12/12/2015 FINDINGS: There small bilateral pleural effusions, left greater than right. There is diffuse interstitial prominence and airspace densities, likely congestive changes or edema. Superimposed pneumonia is not  excluded. Bibasilar subsegmental compressive atelectasis. There is no pneumothorax. The central airways are patent. The thoracic aorta appears unremarkable. The origins of the great vessels of the aortic arch appear patent. Top-normal cardiac size. There is coronary vascular calcification. There is a moderate size pericardial effusion measuring up to 2.5 cm in greatest transverse axial dimension. Correlation with clinical exam and echocardiogram recommended. There is no CT evidence of pulmonary embolism. Top-normal bilateral hilar lymph nodes, likely reactive. Esophagus is grossly unremarkable. There is no axillary adenopathy. The chest wall soft tissues appear unremarkable. There is mild degenerative changes of the spine. No acute fracture. Cholecystectomy. The visualized upper abdomen is otherwise unremarkable. Review of the MIP images confirms the above findings. IMPRESSION: No CT evidence of pulmonary embolism. Moderate size pericardial effusion. Correlation with clinical exam and echocardiogram recommended. Small bilateral pleural effusions with congestive changes. Superimposed pneumonia is not excluded. These results were called by telephone at the time of interpretation on 12/13/2015 at 3:16 am to nurse Elijah Birk who verbally acknowledged these results. Electronically Signed   By: Elgie Collard M.D.   On: 12/13/2015 03:19    ASSESSMENT AND PLAN:   PERICARDIAL EFFUSION:  Moderate circumferential effusion by chest CT.He is not SOB or hypotensive. He does not have a clinical picture of tamponade. Bedside echo shows moderate pericardial effusion with most of the effusion  posterior and would not be easily drained percutaneously.  IVC is not dilated and collapses with sniff test. TSH was normal. Consider PPD. This could be related to a recent or ongoing URI/pneumonia. He was started on IV Heparin yesterday for afib.  Will repeat echo tomorrow and if bigger may need to consider pericardial window.    ATRIAL FIB: This could be related to any ongoing pericardial inflammation he might have had with recent respiratory process. Tom Baker has a CHA2DS2 - VASc score of 2 with a risk of stroke of 2.2%. He would need anticoagulation particularly since he might need DCCV in the weeks to come.He was started on IV Heparin yesterday.    SYNCOPE: This is likely to be cough syncope and vagal. This response could be exacerbated by relatively lower blood pressures related to his pericardial effusion. However, it is likely unrelated. In this situation treatment of the cough is the primary issue.     Armanda Magic, MD  12/14/2015  8:37 AM

## 2015-12-14 NOTE — Progress Notes (Signed)
ANTICOAGULATION CONSULT NOTE Pharmacy Consult for heparin (no bolus) Indication: atrial fibrillation  No Known Allergies  Patient Measurements: TBW 98.4 kg  Vital Signs: Temp: 98.8 F (37.1 C) (06/11 1400) Temp Source: Oral (06/11 1400) BP: 116/58 mmHg (06/11 1400) Pulse Rate: 83 (06/11 1400)  Labs:  Recent Labs  12/12/15 1646 12/12/15 2148 12/13/15 0329 12/13/15 0958  12/14/15 0215 12/14/15 1131 12/14/15 1935  HGB 12.8*  --  11.8*  --   --  12.9*  --   --   HCT 38.4*  --  35.7*  --   --  38.5*  --   --   PLT 437*  --  432*  --   --  450*  --   --   HEPARINUNFRC  --   --   --   --   < > 0.29* 0.32 0.45  CREATININE 1.29*  --  1.19  --   --   --   --   --   TROPONINI  --  <0.03 <0.03 <0.03  --   --   --   --   < > = values in this interval not displayed.  Estimated Creatinine Clearance: 78.9 mL/min (by C-G formula based on Cr of 1.19).   Medical History: Past Medical History  Diagnosis Date  . Diabetes mellitus type 1 (HCC)   . Hypertension   . Hyperlipidemia   . Hypothyroid     Assessment: 60 yo M presents on 6/9 with large pericardial effusion. Also noted to be in Afib. No prior history of Afib. Pharmacy consulted to start heparin for Afib with no bolus. Previously on treatment-dose lovenox, transitioned to heparin drip per Cards with possible need for window if pericardial effusion larger on Monday. No AC pta. Hg up 12.9, plt ok. No bleed or iv line issues per RN.  Heparin level therapeutic x 2  Goal of Therapy:  HL 0.3-0.7 Monitor platelets by anticoagulation protocol: Yes   Plan:  Increase heparin slightly to 1750 units/h to keep in range  daily HL/CBC Monitor s/sx bleeding F/u Cards plans  Thank you Okey RegalLisa Kosisochukwu Goldberg, PharmD [phone] 12/14/2015 8:03 PM

## 2015-12-14 NOTE — Progress Notes (Signed)
ANTICOAGULATION CONSULT NOTE Pharmacy Consult for heparin (no bolus) Indication: atrial fibrillation  No Known Allergies  Patient Measurements: TBW 98.4 kg  Vital Signs: Temp: 98.6 F (37 C) (06/11 0434) Temp Source: Oral (06/11 0434) BP: 109/60 mmHg (06/11 0434) Pulse Rate: 78 (06/11 0434)  Labs:  Recent Labs  12/12/15 1646 12/12/15 2148 12/13/15 0329 12/13/15 0958 12/13/15 1920 12/14/15 0215  HGB 12.8*  --  11.8*  --   --  12.9*  HCT 38.4*  --  35.7*  --   --  38.5*  PLT 437*  --  432*  --   --  450*  HEPARINUNFRC  --   --   --   --  0.20* 0.29*  CREATININE 1.29*  --  1.19  --   --   --   TROPONINI  --  <0.03 <0.03 <0.03  --   --     Estimated Creatinine Clearance: 78.9 mL/min (by C-G formula based on Cr of 1.19).   Medical History: Past Medical History  Diagnosis Date  . Diabetes mellitus type 1 (HCC)   . Hypertension   . Hyperlipidemia   . Hypothyroid     Assessment: 60 yo M presents on 6/9 with large pericardial effusion. Also noted to be in Afib. No prior history of Afib. Pharmacy consulted to start heparin for Afib with no bolus. Previously on treatment-dose lovenox, transitioned to heparin drip per Cards with possible need for window if pericardial effusion larger on Monday. No AC pta. Hg up 12.9, plt ok. No bleed or iv line issues per RN.  HL now therapeutic (0.32) but at bottom of range after rate increase.  Goal of Therapy:  HL 0.3-0.7 Monitor platelets by anticoagulation protocol: Yes   Plan:  Increase heparin slightly to 1750 units/h to keep in range 6h HL to confirm, daily HL/CBC Monitor s/sx bleeding F/u Cards plans  Babs BertinHaley Taiquan Campanaro, PharmD, [facility] Clinical Pharmacist Pager [phone] 12/14/2015 12:50 PM

## 2015-12-15 ENCOUNTER — Other Ambulatory Visit: Payer: Self-pay

## 2015-12-15 ENCOUNTER — Ambulatory Visit (HOSPITAL_BASED_OUTPATIENT_CLINIC_OR_DEPARTMENT_OTHER): Payer: BC Managed Care – PPO

## 2015-12-15 DIAGNOSIS — J90 Pleural effusion, not elsewhere classified: Secondary | ICD-10-CM | POA: Diagnosis present

## 2015-12-15 DIAGNOSIS — Z794 Long term (current) use of insulin: Secondary | ICD-10-CM | POA: Diagnosis not present

## 2015-12-15 DIAGNOSIS — I4892 Unspecified atrial flutter: Secondary | ICD-10-CM | POA: Diagnosis not present

## 2015-12-15 DIAGNOSIS — E039 Hypothyroidism, unspecified: Secondary | ICD-10-CM | POA: Diagnosis present

## 2015-12-15 DIAGNOSIS — I309 Acute pericarditis, unspecified: Secondary | ICD-10-CM

## 2015-12-15 DIAGNOSIS — E785 Hyperlipidemia, unspecified: Secondary | ICD-10-CM | POA: Diagnosis not present

## 2015-12-15 DIAGNOSIS — E109 Type 1 diabetes mellitus without complications: Secondary | ICD-10-CM | POA: Diagnosis present

## 2015-12-15 DIAGNOSIS — Z9641 Presence of insulin pump (external) (internal): Secondary | ICD-10-CM | POA: Diagnosis present

## 2015-12-15 DIAGNOSIS — I480 Paroxysmal atrial fibrillation: Secondary | ICD-10-CM | POA: Diagnosis present

## 2015-12-15 DIAGNOSIS — I319 Disease of pericardium, unspecified: Secondary | ICD-10-CM

## 2015-12-15 DIAGNOSIS — I483 Typical atrial flutter: Secondary | ICD-10-CM | POA: Diagnosis not present

## 2015-12-15 DIAGNOSIS — I119 Hypertensive heart disease without heart failure: Secondary | ICD-10-CM | POA: Diagnosis present

## 2015-12-15 DIAGNOSIS — R55 Syncope and collapse: Secondary | ICD-10-CM | POA: Diagnosis not present

## 2015-12-15 DIAGNOSIS — J9811 Atelectasis: Secondary | ICD-10-CM | POA: Diagnosis present

## 2015-12-15 DIAGNOSIS — E038 Other specified hypothyroidism: Secondary | ICD-10-CM | POA: Diagnosis not present

## 2015-12-15 DIAGNOSIS — I10 Essential (primary) hypertension: Secondary | ICD-10-CM | POA: Diagnosis not present

## 2015-12-15 DIAGNOSIS — N179 Acute kidney failure, unspecified: Secondary | ICD-10-CM | POA: Diagnosis present

## 2015-12-15 DIAGNOSIS — I313 Pericardial effusion (noninflammatory): Secondary | ICD-10-CM | POA: Diagnosis present

## 2015-12-15 DIAGNOSIS — J181 Lobar pneumonia, unspecified organism: Secondary | ICD-10-CM | POA: Diagnosis present

## 2015-12-15 LAB — ECHOCARDIOGRAM LIMITED
FS: 48 % — AB (ref 28–44)
HEIGHTINCHES: 75 in
IV/PV OW: 1.44
LA diam end sys: 34 mm
LA diam index: 1.49 cm/m2
LASIZE: 34 mm
LV PW d: 8.97 mm — AB (ref 0.6–1.1)
LVOT area: 4.52 cm2
LVOT diameter: 24 mm
WEIGHTICAEL: 3425.6 [oz_av]

## 2015-12-15 LAB — GLUCOSE, CAPILLARY
GLUCOSE-CAPILLARY: 73 mg/dL (ref 65–99)
GLUCOSE-CAPILLARY: 88 mg/dL (ref 65–99)
GLUCOSE-CAPILLARY: 89 mg/dL (ref 65–99)
GLUCOSE-CAPILLARY: 118 mg/dL — AB (ref 65–99)
Glucose-Capillary: 170 mg/dL — ABNORMAL HIGH (ref 65–99)
Glucose-Capillary: 88 mg/dL (ref 65–99)

## 2015-12-15 LAB — CBC
HEMATOCRIT: 36.8 % — AB (ref 39.0–52.0)
HEMOGLOBIN: 12.1 g/dL — AB (ref 13.0–17.0)
MCH: 27.4 pg (ref 26.0–34.0)
MCHC: 32.9 g/dL (ref 30.0–36.0)
MCV: 83.4 fL (ref 78.0–100.0)
Platelets: 500 10*3/uL — ABNORMAL HIGH (ref 150–400)
RBC: 4.41 MIL/uL (ref 4.22–5.81)
RDW: 13.8 % (ref 11.5–15.5)
WBC: 8.2 10*3/uL (ref 4.0–10.5)

## 2015-12-15 LAB — HEPARIN LEVEL (UNFRACTIONATED): Heparin Unfractionated: 0.48 IU/mL (ref 0.30–0.70)

## 2015-12-15 MED ORDER — HYDROCOD POLST-CPM POLST ER 10-8 MG/5ML PO SUER
5.0000 mL | Freq: Two times a day (BID) | ORAL | Status: DC
  Administered 2015-12-15 – 2015-12-16 (×2): 5 mL via ORAL
  Filled 2015-12-15 (×2): qty 5

## 2015-12-15 MED ORDER — COLCHICINE 0.6 MG PO TABS
0.6000 mg | ORAL_TABLET | Freq: Two times a day (BID) | ORAL | Status: DC
  Administered 2015-12-15 – 2015-12-16 (×2): 0.6 mg via ORAL
  Filled 2015-12-15 (×2): qty 1

## 2015-12-15 MED ORDER — GUAIFENESIN-DM 100-10 MG/5ML PO SYRP
5.0000 mL | ORAL_SOLUTION | ORAL | Status: DC | PRN
  Administered 2015-12-16: 5 mL via ORAL
  Filled 2015-12-15: qty 5

## 2015-12-15 MED ORDER — COLCHICINE 0.6 MG PO TABS
0.6000 mg | ORAL_TABLET | Freq: Two times a day (BID) | ORAL | Status: DC
  Filled 2015-12-15: qty 1

## 2015-12-15 NOTE — Progress Notes (Signed)
Inpatient Diabetes Program Recommendations  AACE/ADA: New Consensus Statement on Inpatient Glycemic Control (2015)  Target Ranges:  Prepandial:   less than 140 mg/dL      Peak postprandial:   less than 180 mg/dL (1-2 hours)      Critically ill patients:  140 - 180 mg/dL   Results for Tom Baker, Tom Baker (MRN [id number]) as of 12/15/2015 13:16  Ref. Range 12/14/2015 16:43 12/14/2015 21:51 12/15/2015 03:36 12/15/2015 06:13 12/15/2015 08:10 12/15/2015 12:35  Glucose-Capillary Latest Ref Range: 65-99 mg/dL 829137 (H) 99 562170 (H) 88 73 88    Review of Glycemic Control  Diabetes history: DM 1 Outpatient Diabetes medications: Animas insulin pump Current orders for Inpatient glycemic control: Animas insulin pump  Inpatient Diabetes Program Recommendations:   Spoke with patient regarding diabetes and home regimen for diabetes management. States he changed his insulin pump access site after admitted into the hospital. Patient states he is followed by Margaretmary BayleyPreston Clark for diabetes management. Patient uses a Animas insulin pump with Humalog insulin as an outpatient.   Current insulin pump settings are as follows:  Basal insulin  12A-3A 0.8 units/hour 3A-6A               1.0 units/hour 6A-12N              0.6 units/hour 12N-5P              0.45 units/hour 5P-12MN            0.55 units/hour  Total daily basal insulin: 15.1 units/24 hours  Carb Coverage 1:12 1 unit for every 15 grams of carbohydrates  Target Glucose Goals 7A 100 mg/dl  In talking with the patient he states that his blood glucose normally runs very good and his last A1C was 6.1%.    Nurses have initiated the insulin pump flowsheet.  Reviewed with patient to check CBG if has symptoms of hypoglycemia and CBG with blood pressure immediately following any syncopal episodes. No questions per patient @ this time. Will follow. Thank you, Billy FischerJudy E. Joss Friedel, RN, MSN, CDE Inpatient Glycemic Control Team Team Pager [phone] (8am-5pm) 12/15/2015 1:33  PM

## 2015-12-15 NOTE — Progress Notes (Signed)
  Echocardiogram 2D Echocardiogram has been performed.  Janalyn HarderWest, Damyan Corne R 12/15/2015, 1:31 PM

## 2015-12-15 NOTE — Progress Notes (Signed)
Patient sitting up in chair, wife present at bedside. No needs at this time, call light within reach. 

## 2015-12-15 NOTE — Progress Notes (Signed)
ANTICOAGULATION CONSULT NOTE - Follow Up Consult  Pharmacy Consult for Heparin Indication: atrial fibrillation  No Known Allergies  Patient Measurements: Height: 6\' 3"  (190.5 cm) Weight: 214 lb 1.6 oz (97.115 kg) IBW/kg (Calculated) : 84.5  Vital Signs: Temp: 98.1 F (36.7 C) (06/12 0355) Temp Source: Oral (06/12 0355) BP: 115/58 mmHg (06/12 0355) Pulse Rate: 86 (06/12 0355)  Labs:  Recent Labs  12/12/15 1646 12/12/15 2148 12/13/15 0329 12/13/15 0958  12/14/15 0215 12/14/15 1131 12/14/15 1935 12/15/15 0232  HGB 12.8*  --  11.8*  --   --  12.9*  --   --  12.1*  HCT 38.4*  --  35.7*  --   --  38.5*  --   --  36.8*  PLT 437*  --  432*  --   --  450*  --   --  500*  HEPARINUNFRC  --   --   --   --   < > 0.29* 0.32 0.45 0.48  CREATININE 1.29*  --  1.19  --   --   --   --   --   --   TROPONINI  --  <0.03 <0.03 <0.03  --   --   --   --   --   < > = values in this interval not displayed.  Estimated Creatinine Clearance: 78.9 mL/min (by C-G formula based on Cr of 1.19).   Medications:  Heparin @ 1750 units/hr  Assessment: 59yom with new onset afib (CHADSVASC = 2), initially started on lovenox then transitioned to IV heparin as ECHO showed moderate pericardial effusion possibly requiring pericardial window. Heparin level is therapeutic at 0.48. CBC stable. No bleeding. Noted plan for repeat ECHO and if effusion bigger then will need procedure.  Goal of Therapy:  Heparin level 0.3-0.7 units/ml Monitor platelets by anticoagulation protocol: Yes   Plan:  1) Continue heparin at 1750 units/hr 2) Follow up ECHO and need for pericardial window  Fredrik RiggerMarkle, Neri Samek Sue 12/15/2015,10:23 AM

## 2015-12-15 NOTE — Progress Notes (Signed)
Patient Name: Tom Baker Date of Encounter: 12/15/2015   SUBJECTIVE  Feeling well. No chest pain, sob or palpitations. Cough improved.   CURRENT MEDS . amLODipine  10 mg Oral Daily  . atorvastatin  10 mg Oral Daily  . azithromycin  500 mg Intravenous Q24H  . cefTRIAXone (ROCEPHIN)  IV  2 g Intravenous Q24H  . cholecalciferol  2,000 Units Oral Daily  . insulin pump   Subcutaneous TID AC, HS, 0200  . levothyroxine  125 mcg Oral QAC breakfast  . losartan  100 mg Oral Daily  . multivitamin with minerals  1 tablet Oral Daily    OBJECTIVE  Filed Vitals:   12/14/15 1400 12/14/15 2155 12/15/15 0355 12/15/15 0500  BP: 116/58 112/55 115/58   Pulse: 83 71 86   Temp: 98.8 F (37.1 C) 98.5 F (36.9 C) 98.1 F (36.7 C)   TempSrc: Oral Oral Oral   Resp: Height:      Weight:    214 lb 1.6 oz (97.115 kg)  SpO2: 100% 100% 98%     Intake/Output Summary (Last 24 hours) at 12/15/15 1048 Last data filed at 12/14/15 1700  Gross per 24 hour  Intake  787.5 ml  Output      0 ml  Net  787.5 ml   Filed Weights   12/13/15 0629 12/14/15 0434 12/15/15 0500  Weight: 203 lb 12.8 oz (92.443 kg) 214 lb 4.8 oz (97.206 kg) 214 lb 1.6 oz (97.115 kg)    PHYSICAL EXAM  General: Pleasant, NAD. Neuro: Alert and oriented X 3. Moves all extremities spontaneously. Psych: Normal affect. HEENT:  Normal  Neck: Supple without bruits or JVD. Lungs:  Resp regular and unlabored, CTA. Heart: Regular tachycardic no s3, s4, loud 3 component friction rub  Abdomen: Soft, non-tender, non-distended, BS + x 4.  Extremities: No clubbing, cyanosis. Trace BL LE edema. DP/PT/Radials 2+ and equal bilaterally.  Accessory Clinical Findings  CBC  Recent Labs  12/14/15 0215 12/15/15 0232  WBC 8.1 8.2  HGB 12.9* 12.1*  HCT 38.5* 36.8*  MCV 83.0 83.4  PLT 450* 500*   Basic Metabolic Panel  Recent Labs  12/12/15 1646 12/13/15 0329  NA 135 137  K 3.9 4.0  CL 103 103  CO2 23 27  GLUCOSE 85  96  BUN 12 8  CREATININE 1.29* 1.19  CALCIUM 9.1 8.8*   Liver Function Tests No results for input(s): AST, ALT, ALKPHOS, BILITOT, PROT, ALBUMIN in the last 72 hours. No results for input(s): LIPASE, AMYLASE in the last 72 hours. Cardiac Enzymes  Recent Labs  12/12/15 2148 12/13/15 0329 12/13/15 0958  TROPONINI <0.03 <0.03 <0.03   BNP Invalid input(s): POCBNP D-Dimer  Recent Labs  12/12/15 2148  DDIMER 3.98*   Hemoglobin A1C No results for input(s): HGBA1C in the last 72 hours. Fasting Lipid Panel No results for input(s): CHOL, HDL, LDLCALC, TRIG, CHOLHDL, LDLDIRECT in the last 72 hours. Thyroid Function Tests  Recent Labs  12/14/15 1231  TSH 2.288    TELE  Sinus tachy at rate of 100-110s  Radiology/Studies  Dg Chest 2 View  12/12/2015  CLINICAL DATA:  Syncope and cough. EXAM: CHEST  2 VIEW COMPARISON:  December 10, 2015 FINDINGS: Stable cardiomegaly. The hila and mediastinum are unchanged. No pneumothorax. No pulmonary nodules or masses. Mild opacity and probable small effusions seen in left base common and inguinal. No other interval changes or acute abnormalities. IMPRESSION: Probable small left effusion with underlying  opacity. Recommend follow-up to resolution. Electronically Signed   By: Gerome Samavid  Williams III M.D   On: 12/12/2015 20:34   Dg Chest 2 View  12/10/2015  CLINICAL DATA:  Cough x1 week, fever, dizziness EXAM: CHEST  2 VIEW COMPARISON:  None. FINDINGS: Lungs are clear.  No pleural effusion or pneumothorax. Cardiomegaly. Visualized osseous structures are within normal limits. IMPRESSION: No evidence of acute cardiopulmonary disease. Electronically Signed   By: Charline BillsSriyesh  Krishnan M.D.   On: 12/10/2015 14:43   Ct Angio Chest Pe W/cm &/or Wo Cm  12/13/2015  CLINICAL DATA:  60 year old male with elevated D-dimer, syncope, and collapse. EXAM: CT ANGIOGRAPHY CHEST WITH CONTRAST TECHNIQUE: Multidetector CT imaging of the chest was performed using the standard protocol  during bolus administration of intravenous contrast. Multiplanar CT image reconstructions and MIPs were obtained to evaluate the vascular anatomy. CONTRAST:  60 cc Isovue 370 COMPARISON:  Chest radiograph dated 12/12/2015 FINDINGS: There small bilateral pleural effusions, left greater than right. There is diffuse interstitial prominence and airspace densities, likely congestive changes or edema. Superimposed pneumonia is not excluded. Bibasilar subsegmental compressive atelectasis. There is no pneumothorax. The central airways are patent. The thoracic aorta appears unremarkable. The origins of the great vessels of the aortic arch appear patent. Top-normal cardiac size. There is coronary vascular calcification. There is a moderate size pericardial effusion measuring up to 2.5 cm in greatest transverse axial dimension. Correlation with clinical exam and echocardiogram recommended. There is no CT evidence of pulmonary embolism. Top-normal bilateral hilar lymph nodes, likely reactive. Esophagus is grossly unremarkable. There is no axillary adenopathy. The chest wall soft tissues appear unremarkable. There is mild degenerative changes of the spine. No acute fracture. Cholecystectomy. The visualized upper abdomen is otherwise unremarkable. Review of the MIP images confirms the above findings. IMPRESSION: No CT evidence of pulmonary embolism. Moderate size pericardial effusion. Correlation with clinical exam and echocardiogram recommended. Small bilateral pleural effusions with congestive changes. Superimposed pneumonia is not excluded. These results were called by telephone at the time of interpretation on 12/13/2015 at 3:16 am to nurse Elijah Birkaldwell who verbally acknowledged these results. Electronically Signed   By: Elgie CollardArash  Radparvar M.D.   On: 12/13/2015 03:19    ASSESSMENT AND PLAN  1. Pericardiac effusion - No clinical features of tamponade. TSH normal. This could be related to a recent or ongoing URI/pneumonia.  Repeat echo today --> if bigger may need to consider pericardial window. Consider PPD. Watch while on anticoagulation.   2. PAF - likely due to pericardial inflammation or recent URI/pneumonia. Started on IV heparin for anticoagulation.  CHA2DS2 - VASc score of 2 with a risk of stroke of 2.2%. Tele showed sinus tachy. Will get EKG to confirm. Consider adding BB due to tachycardia.   3. Syncope - vaso vagal due to cough  vs low BP. No further episode.    4. HTN - BP stable on Norvasc and losartan. He has chronic trace LE edema with vascular skin color change. ? due to DM vs amlodipine. Can consider discontinuation of norvasc and adding BB for better rate control as described above. MD to see.   5. DM - Per primary  6. Possible pneumonia - On abx per primary.     Lorelei PontSigned, Bhagat,Bhavinkumar PA-C Pager [phone]  Attending Note:   The patient was seen and examined.  Agree with assessment and plan as noted above.  Changes made to the above note as needed.  Patient seen and independently examined with Chelsea AusVin Bhagat, PA .  We discussed all aspects of the encounter. I agree with the assessment and plan as stated above.  He has a loud friction rub.   Will start colchicine 0.6 bid  Getting an echo to evaluate his pericardial effusion again  No clinical signs of tamponade.    I have spent a total of 30 minutes with patient reviewing hospital  notes , telemetry, EKGs, labs and examining patient as well as establishing an assessment and plan that was discussed with the patient. > 50% of time was spent in direct patient care.   Vesta Mixer, Montez Hageman., MD, [facility] 12/15/2015, 1:01 PM [address]  [address] Office [phone] Pager [phone]

## 2015-12-15 NOTE — Progress Notes (Signed)
Patient ID: Tom Baker, male   DOB: July 11, 1955, 60 y.o.   MRN: [id number]  PROGRESS NOTE    Tom LoryDaniel Marschall  OZD:[id number] DOB: July 11, 1955 DOA: 12/12/2015  PCP: Laurena SlimmerLARK,PRESTON S, MD   Brief Narrative:  60 y.o. male with past medical history significant for HTN, HLD, diabetes mellitus type 1, hypothyroidism [facility] status post syncopal event which is the 2nd one in past week. The firs syncopal event occurred while he was standing, no exertion other than coughing. The 2nd episode happened as he was coughing. He has had upper respiratory tract infection for past week or so. He saw his PCP and was given an abx for possible bronchitis.  Pt was hemodynamically stable on admission. He was found to have moderate pericardial effusion on CT scan Seen by cardio in consultation. He was started on IV Rocephin and azithromycin empirically for possible pneumonia.   Assessment & Plan:   Principal Problem:   Syncope / moderate pericardial effusion - Moderate effusion on CT scan and 2 D ECHO  - Repeat echo on today - Continue heparin drip   Active Problems:   Possible lobar pneumonia - Continue azithro and Rocephin - Blood cultures not obtained on admission    Hypertension, essential - Continue norvasc and losartan    Hypothyroidism - Continue synthroid - TSH WNL    Diabetes mellitus type 1 without complications with long term insulin use (HCC) - Continue insulin pump  - CBG's in past 24 hours: 88, 73, 88    Dyslipidemia associated with DM - Continue stain therapy    Atrial flutter, atrial fibrillation (HCC) - CHADS vasc score 2  - Continue heparin drip - Cardio following    DVT prophylaxis: on heparin drip  Code Status: full code  Family Communication: wife at the bedside 6/10, 6/12; showed CT scan images to explain pericardial effusion, spoke with pt and his wife about plan of care, we talked about a fib and anticoagulation  Disposition Plan: plan for repeat echo today     Consultants:   Cardiology   Procedures:   2 D ECHO 12/13/2015 - EF 60%, moderate pericardial effusion mostly posteriorly  Antimicrobials:   Azithromycin and rocephin 12/12/2015 -->   Subjective: No chest pain.   Objective: Filed Vitals:   12/14/15 2155 12/15/15 0355 12/15/15 0500 12/15/15 1238  BP: 112/55 115/58  121/71  Pulse: 71 86  102  Temp: 98.5 F (36.9 C) 98.1 F (36.7 C)  99.2 F (37.3 C)  TempSrc: Oral Oral  Oral  Resp: 18 18  19   Height:      Weight:   97.115 kg (214 lb 1.6 oz)   SpO2: 100% 98%  99%    Intake/Output Summary (Last 24 hours) at 12/15/15 1541 Last data filed at 12/15/15 1300  Gross per 24 hour  Intake 1061.5 ml  Output      0 ml  Net 1061.5 ml   Filed Weights   12/13/15 0629 12/14/15 0434 12/15/15 0500  Weight: 92.443 kg (203 lb 12.8 oz) 97.206 kg (214 lb 4.8 oz) 97.115 kg (214 lb 1.6 oz)    Examination:  General exam: calm, comfortable  Respiratory system: bilateral air entry, no wheezing  Cardiovascular system: S1 & S2 (+), rhythm irregular Gastrointestinal system: (+) BS, non distended, non tender  Central nervous system: Nonfocal  Extremities: no swelling, no tenderness  Skin: no lesions and no ulcer   Psychiatry: no agitation or restlessness   Data Reviewed: I have personally  reviewed following labs and imaging studies  CBC:  Recent Labs Lab 12/12/15 1646 12/13/15 0329 12/14/15 0215 12/15/15 0232  WBC 7.0 6.6 8.1 8.2  HGB 12.8* 11.8* 12.9* 12.1*  HCT 38.4* 35.7* 38.5* 36.8*  MCV 82.8 82.8 83.0 83.4  PLT 437* 432* 450* 500*   Basic Metabolic Panel:  Recent Labs Lab 12/12/15 1646 12/13/15 0329  NA 135 137  K 3.9 4.0  CL 103 103  CO2 23 27  GLUCOSE 85 96  BUN 12 8  CREATININE 1.29* 1.19  CALCIUM 9.1 8.8*   GFR: Estimated Creatinine Clearance: 78.9 mL/min (by C-G formula based on Cr of 1.19). Liver Function Tests: No results for input(s): AST, ALT, ALKPHOS, BILITOT, PROT, ALBUMIN in the last 168  hours. No results for input(s): LIPASE, AMYLASE in the last 168 hours. No results for input(s): AMMONIA in the last 168 hours. Coagulation Profile: No results for input(s): INR, PROTIME in the last 168 hours. Cardiac Enzymes:  Recent Labs Lab 12/12/15 2148 12/13/15 0329 12/13/15 0958  TROPONINI <0.03 <0.03 <0.03   BNP (last 3 results) No results for input(s): PROBNP in the last 8760 hours. HbA1C: No results for input(s): HGBA1C in the last 72 hours. CBG:  Recent Labs Lab 12/14/15 2151 12/15/15 0336 12/15/15 0613 12/15/15 0810 12/15/15 1235  GLUCAP 99 170* 88 73 88   Lipid Profile: No results for input(s): CHOL, HDL, LDLCALC, TRIG, CHOLHDL, LDLDIRECT in the last 72 hours. Thyroid Function Tests:  Recent Labs  12/14/15 1231  TSH 2.288   Anemia Panel: No results for input(s): VITAMINB12, FOLATE, FERRITIN, TIBC, IRON, RETICCTPCT in the last 72 hours. Urine analysis:    Component Value Date/Time   COLORURINE YELLOW 12/12/2015 1750   APPEARANCEUR CLEAR 12/12/2015 1750   LABSPEC 1.011 12/12/2015 1750   PHURINE 5.5 12/12/2015 1750   GLUCOSEU NEGATIVE 12/12/2015 1750   HGBUR NEGATIVE 12/12/2015 1750   BILIRUBINUR NEGATIVE 12/12/2015 1750   KETONESUR NEGATIVE 12/12/2015 1750   PROTEINUR NEGATIVE 12/12/2015 1750   NITRITE NEGATIVE 12/12/2015 1750   LEUKOCYTESUR NEGATIVE 12/12/2015 1750   Sepsis Labs: @LABRCNTIP (procalcitonin:4,lacticidven:4)   )No results found for this or any previous visit (from the past 240 hour(s)).    Radiology Studies: Dg Chest 2 View 12/12/2015 Probable small left effusion with underlying opacity. Recommend follow-up to resolution.   Dg Chest 2 View 12/10/2015  No evidence of acute cardiopulmonary disease.   Ct Angio Chest Pe W/cm &/or Wo Cm 12/13/2015  No CT evidence of pulmonary embolism. Moderate size pericardial effusion. Correlation with clinical exam and echocardiogram recommended. Small bilateral pleural effusions with congestive  changes. Superimposed pneumonia is not excluded.   Scheduled Meds: . amLODipine  10 mg Oral Daily  . atorvastatin  10 mg Oral Daily  . azithromycin  500 mg Intravenous Q24H  . cefTRIAXone (ROCEPHIN)  IV  2 g Intravenous Q24H  . cholecalciferol  2,000 Units Oral Daily  . colchicine  0.6 mg Oral BID  . insulin pump   Subcutaneous TID AC, HS, 0200  . levothyroxine  125 mcg Oral QAC breakfast  . losartan  100 mg Oral Daily  . multivitamin with minerals  1 tablet Oral Daily   Continuous Infusions: . sodium chloride 100 mL/hr at 12/13/15 1253  . heparin 1,750 Units/hr (12/14/15 2040)     LOS: 0 days   Time spent: 25 minutes  Greater than 50% of the time spent on counseling and coordinating the care.   Manson Passey, MD [facility] Pager [phone]  If 7PM-7AM, please contact night-coverage [email or web address] Password [facility] 12/15/2015, 3:41 PM

## 2015-12-16 ENCOUNTER — Other Ambulatory Visit: Payer: Self-pay | Admitting: Physician Assistant

## 2015-12-16 DIAGNOSIS — I313 Pericardial effusion (noninflammatory): Secondary | ICD-10-CM

## 2015-12-16 DIAGNOSIS — I309 Acute pericarditis, unspecified: Secondary | ICD-10-CM | POA: Insufficient documentation

## 2015-12-16 DIAGNOSIS — I3139 Other pericardial effusion (noninflammatory): Secondary | ICD-10-CM

## 2015-12-16 LAB — CBC
HCT: 36.8 % — ABNORMAL LOW (ref 39.0–52.0)
HEMOGLOBIN: 12 g/dL — AB (ref 13.0–17.0)
MCH: 27.3 pg (ref 26.0–34.0)
MCHC: 32.6 g/dL (ref 30.0–36.0)
MCV: 83.6 fL (ref 78.0–100.0)
PLATELETS: 438 10*3/uL — AB (ref 150–400)
RBC: 4.4 MIL/uL (ref 4.22–5.81)
RDW: 13.9 % (ref 11.5–15.5)
WBC: 7.7 10*3/uL (ref 4.0–10.5)

## 2015-12-16 LAB — GLUCOSE, CAPILLARY
GLUCOSE-CAPILLARY: 162 mg/dL — AB (ref 65–99)
GLUCOSE-CAPILLARY: 162 mg/dL — AB (ref 65–99)
Glucose-Capillary: 37 mg/dL — CL (ref 65–99)

## 2015-12-16 LAB — HEPARIN LEVEL (UNFRACTIONATED): HEPARIN UNFRACTIONATED: 0.21 [IU]/mL — AB (ref 0.30–0.70)

## 2015-12-16 MED ORDER — COLCHICINE 0.6 MG PO TABS: 0.6000 mg | ORAL_TABLET | Freq: Two times a day (BID) | ORAL | Status: DC

## 2015-12-16 MED ORDER — METOPROLOL TARTRATE 25 MG PO TABS: 25.0000 mg | ORAL_TABLET | Freq: Two times a day (BID) | ORAL | Status: DC

## 2015-12-16 MED ORDER — AMLODIPINE BESYLATE 5 MG PO TABS: 5.0000 mg | ORAL_TABLET | Freq: Every day | ORAL | Status: AC

## 2015-12-16 MED ORDER — METOPROLOL TARTRATE 25 MG PO TABS
25.0000 mg | ORAL_TABLET | Freq: Two times a day (BID) | ORAL | Status: DC
  Administered 2015-12-16: 25 mg via ORAL
  Filled 2015-12-16: qty 1

## 2015-12-16 MED ORDER — ASPIRIN EC 81 MG PO TBEC: 81.0000 mg | DELAYED_RELEASE_TABLET | Freq: Every day | ORAL | Status: DC

## 2015-12-16 MED ORDER — AMLODIPINE BESYLATE 5 MG PO TABS: 5.0000 mg | ORAL_TABLET | Freq: Every day | ORAL | Status: DC

## 2015-12-16 NOTE — Discharge Instructions (Signed)
Pericardial Effusion °Pericardial effusion is a buildup of fluid around your heart. The heart is surrounded by a double-layered sac (pericardium). This sac normally contains a small amount of fluid. When too much builds up, it can put pressure on your heart and cause problems. As fluid builds up in the pericardial sac and pressure on your heart increases, it becomes harder for your heart to pump blood. When fluid prevents your heart from pumping enough blood, it is called cardiac tamponade. Cardiac tamponade is a life-threatening condition. °CAUSES  °Often the cause of pericardial effusion is not known (idiopathic effusion). Possible causes are from: °· Infections, such as from a virus, bacteria, fungus, or parasite. °· Damage to the pericardium from heart surgery or a heart attack. °· Inflammatory diseases, such as rheumatoid arthritis or lupus. °· Kidney disease. °· Thyroid disease. °· Cancer. °· Cancer treatment, including radiation or chemotherapy. °· Certain drugs, including tuberculosis drugs or seizure drugs. °· Chest trauma. °SIGNS AND SYMPTOMS  °Pericardial effusion may not cause symptoms at first, especially if the fluid builds up slowly. In time, pressure on the heart may cause: °· Chest pain. °· Trouble breathing. °· Pain and shortness of breath that is worse when lying down. °· Dizziness. °· Fainting. °· Cough. °· Hiccups. °· Skipped heartbeats (palpitations). °· Anxiety and confusion. °· A bluish skin color (cyanosis). °· Swollen legs and ankles. °DIAGNOSIS  °Your health care provider may suspect pericardial effusion based on your symptoms. Your health care provider may also do a physical exam to check for: °· Low blood pressure and weak pulses. °· Soft (muffled) heart sounds. °· Rapid heartbeat. °· Full veins in your neck (distended jugular veins). °· Decreased breathing sounds and a rubbing sound (friction rub) when listening to your lungs. °Your health care provider may also do several tests to  confirm the diagnosis and find out what is causing the pericardial effusion. These may include: °· Chest X-ray. °· Imaging study of the heart using sound waves (echocardiogram). °· CT scan or MRI. °· Electrical study of the heart (electrocardiogram [ECG]). °· A procedure using a needle to remove fluid from the pericardium for examination (pericardiocentesis). °· Blood tests to check for: °¨ Infection. °¨ Heart damage. °¨ Thyroid abnormalities. °¨ Kidney disease. °¨ Inflammatory disorders. °TREATMENT  °Treatment for pericardial effusion depends on the cause of your symptoms and how severe your symptoms are. If a specific cause was found, that cause will be treated. Treatment may include: °· Medicines, such as: °¨ Nonsteroidal anti-inflammatory drugs (NSAIDs). °¨ Other anti-inflammatory drugs, such as steroids. °¨ Antibiotic medicine. °¨ Antifungal medicine. °· Hospital treatment may be necessary, such as for cardiac tamponade. This may include: °¨ Intravenous (IV) fluids. °¨ Breathing support. °· Surgery may be needed in severe cases. Surgery may include: °¨ Pericardiocentesis. °¨ Open heart surgery. °¨ A procedure to make a permanent opening in the pericardium (pericardial window). °SEEK MEDICAL CARE IF: °· You feel dizzy or light-headed. °· You have swelling in your legs or ankles. °· You have heart palpitations. °· You have persistent cough or hiccups. °SEEK IMMEDIATE MEDICAL CARE IF:  °· You faint. °· You have chest pain. °· You have trouble breathing. °These symptoms may represent a serious problem that is an emergency. Do not wait to see if the symptoms will go away. Get medical help right away. Call your local emergency services (911 in the [place] Do not drive yourself to the hospital. °MAKE SURE YOU: °· Understand these instructions. °· Will watch your condition. °·   Will get help right away if you are not doing well or get worse. °  °This information is not intended to replace advice given to you by your  health care provider. Make sure you discuss any questions you have with your health care provider. °  °Document Released: 02/16/2005 Document Revised: 07/12/2014 Document Reviewed: 11/08/2013 °Elsevier Interactive Patient Education ©2016 [employer] ° °

## 2015-12-16 NOTE — Progress Notes (Signed)
Inpatient Diabetes Program Recommendations  AACE/ADA: New Consensus Statement on Inpatient Glycemic Control (2015)  Target Ranges:  Prepandial:   less than 140 mg/dL      Peak postprandial:   less than 180 mg/dL (1-2 hours)      Critically ill patients:  140 - 180 mg/dL   Results for Tom Baker, [name] (MRN [id number]) as of 12/16/2015 13:43  Ref. Range 12/15/2015 17:45 12/15/2015 21:41 12/16/2015 08:19 12/16/2015 09:40 12/16/2015 11:54  Glucose-Capillary Latest Ref Range: 65-99 mg/dL 89 914118 (H) 37 (LL) 782162 (H) 162 (H)    Review of Glycemic Control  Diabetes history: DM 1 Outpatient Diabetes medications: Animas insulin pump Current orders for Inpatient glycemic control: Animas insulin pump  Inpatient Diabetes Program Recommendations:  Noted CBG 37 this am. 2 am CBG was not checked per insulin pump order set orders. Reviewed with patient signs and symptoms of low blood sugar with patient and wife. Patient to followup with Dr. Chestine Sporelark regarding CBGs and insulin pump settings review.   Thank you, Billy FischerJudy E. Pritika Alvarez, RN, MSN, CDE Inpatient Glycemic Control Team Team Pager [phone] (8am-5pm) 12/16/2015 1:54 PM

## 2015-12-16 NOTE — Progress Notes (Signed)
Patient Name: Tom Baker Date of Encounter: 12/16/2015   SUBJECTIVE  Feeling well. No chest pain, sob or palpitations. Cough improved.  Ready to go home   CURRENT MEDS . amLODipine  10 mg Oral Daily  . atorvastatin  10 mg Oral Daily  . azithromycin  500 mg Intravenous Q24H  . cefTRIAXone (ROCEPHIN)  IV  2 g Intravenous Q24H  . chlorpheniramine-HYDROcodone  5 mL Oral Q12H  . cholecalciferol  2,000 Units Oral Daily  . colchicine  0.6 mg Oral BID  . insulin pump   Subcutaneous TID AC, HS, 0200  . levothyroxine  125 mcg Oral QAC breakfast  . losartan  100 mg Oral Daily  . multivitamin with minerals  1 tablet Oral Daily    OBJECTIVE  Filed Vitals:   12/15/15 1238 12/15/15 2144 12/16/15 0348 12/16/15 0556  BP: 121/71 122/61 127/73   Pulse: 102 100    Temp: 99.2 F (37.3 C) 98.9 F (37.2 C) 98.2 F (36.8 C)   TempSrc: Oral Oral    Resp: 19 18    Height:      Weight:    215 lb 9.8 oz (97.8 kg)  SpO2: 99% 100% 95%     Intake/Output Summary (Last 24 hours) at 12/16/15 1114 Last data filed at 12/16/15 0900  Gross per 24 hour  Intake   1080 ml  Output      0 ml  Net   1080 ml   Filed Weights   12/14/15 0434 12/15/15 0500 12/16/15 0556  Weight: 214 lb 4.8 oz (97.206 kg) 214 lb 1.6 oz (97.115 kg) 215 lb 9.8 oz (97.8 kg)    PHYSICAL EXAM  General: Pleasant, NAD. Neuro: Alert and oriented X 3. Moves all extremities spontaneously. Psych: Normal affect. HEENT:  Normal  Neck: Supple without bruits or JVD. Lungs:  Resp regular and unlabored, CTA. Heart: Regular tachycardic no s3, s4, loud 3 component friction rub  Abdomen: Soft, non-tender, non-distended, BS + x 4.  Extremities: No clubbing, cyanosis. Trace BL LE edema. DP/PT/Radials 2+ and equal bilaterally.  Accessory Clinical Findings  CBC  Recent Labs  12/15/15 0232 12/16/15 0337  WBC 8.2 7.7  HGB 12.1* 12.0*  HCT 36.8* 36.8*  MCV 83.4 83.6  PLT 500* 438*   Basic Metabolic Panel No results for  input(s): NA, K, CL, CO2, GLUCOSE, BUN, CREATININE, CALCIUM, MG, PHOS in the last 72 hours. Liver Function Tests No results for input(s): AST, ALT, ALKPHOS, BILITOT, PROT, ALBUMIN in the last 72 hours. No results for input(s): LIPASE, AMYLASE in the last 72 hours. Cardiac Enzymes No results for input(s): CKTOTAL, CKMB, CKMBINDEX, TROPONINI in the last 72 hours. BNP Invalid input(s): POCBNP D-Dimer No results for input(s): DDIMER in the last 72 hours. Hemoglobin A1C No results for input(s): HGBA1C in the last 72 hours. Fasting Lipid Panel No results for input(s): CHOL, HDL, LDLCALC, TRIG, CHOLHDL, LDLDIRECT in the last 72 hours. Thyroid Function Tests  Recent Labs  12/14/15 1231  TSH 2.288    TELE  Sinus tachy at rate of 100-110s  Radiology/Studies  Dg Chest 2 View  12/12/2015  CLINICAL DATA:  Syncope and cough. EXAM: CHEST  2 VIEW COMPARISON:  December 10, 2015 FINDINGS: Stable cardiomegaly. The hila and mediastinum are unchanged. No pneumothorax. No pulmonary nodules or masses. Mild opacity and probable small effusions seen in left base common and inguinal. No other interval changes or acute abnormalities. IMPRESSION: Probable small left effusion with underlying opacity. Recommend follow-up to resolution.  Electronically Signed   By: Gerome Sam III M.D   On: 12/12/2015 20:34   Dg Chest 2 View  12/10/2015  CLINICAL DATA:  Cough x1 week, fever, dizziness EXAM: CHEST  2 VIEW COMPARISON:  None. FINDINGS: Lungs are clear.  No pleural effusion or pneumothorax. Cardiomegaly. Visualized osseous structures are within normal limits. IMPRESSION: No evidence of acute cardiopulmonary disease. Electronically Signed   By: Charline Bills M.D.   On: 12/10/2015 14:43   Ct Angio Chest Pe W/cm &/or Wo Cm  12/13/2015  CLINICAL DATA:  60 year old male with elevated D-dimer, syncope, and collapse. EXAM: CT ANGIOGRAPHY CHEST WITH CONTRAST TECHNIQUE: Multidetector CT imaging of the chest was performed  using the standard protocol during bolus administration of intravenous contrast. Multiplanar CT image reconstructions and MIPs were obtained to evaluate the vascular anatomy. CONTRAST:  60 cc Isovue 370 COMPARISON:  Chest radiograph dated 12/12/2015 FINDINGS: There small bilateral pleural effusions, left greater than right. There is diffuse interstitial prominence and airspace densities, likely congestive changes or edema. Superimposed pneumonia is not excluded. Bibasilar subsegmental compressive atelectasis. There is no pneumothorax. The central airways are patent. The thoracic aorta appears unremarkable. The origins of the great vessels of the aortic arch appear patent. Top-normal cardiac size. There is coronary vascular calcification. There is a moderate size pericardial effusion measuring up to 2.5 cm in greatest transverse axial dimension. Correlation with clinical exam and echocardiogram recommended. There is no CT evidence of pulmonary embolism. Top-normal bilateral hilar lymph nodes, likely reactive. Esophagus is grossly unremarkable. There is no axillary adenopathy. The chest wall soft tissues appear unremarkable. There is mild degenerative changes of the spine. No acute fracture. Cholecystectomy. The visualized upper abdomen is otherwise unremarkable. Review of the MIP images confirms the above findings. IMPRESSION: No CT evidence of pulmonary embolism. Moderate size pericardial effusion. Correlation with clinical exam and echocardiogram recommended. Small bilateral pleural effusions with congestive changes. Superimposed pneumonia is not excluded. These results were called by telephone at the time of interpretation on 12/13/2015 at 3:16 am to nurse Elijah Birk who verbally acknowledged these results. Electronically Signed   By: Elgie Collard M.D.   On: 12/13/2015 03:19    ASSESSMENT AND PLAN  1. Pericardiac effusion - No clinical features of tamponade. TSH normal. This could be related to a recent or  ongoing URI/pneumonia. The repeat echo showed no significant change - perhaps it was a little better   He should see [place] in 1-2 weeks with a limited echo to evaluate his pericardial effusion   2. PAF - likely due to pericardial inflammation or recent URI/pneumonia. Started on IV heparin for anticoagulation.  CHA2DS2 - VASc score of 2 with a risk of stroke of 2.2%. Tele showed sinus tachy. Will get EKG to confirm.  Ideally, we would start Xarelto 20 mg a day. I would like for his pericarditis to clear up a bit more before we start him on Xarelto ( to avoid bleeding into the pericardium )   We will discuss this in the office in several weeks.   HR is still a little high.   Will decrease the amlodipine to 5 mg a day and start metoprolol 25 mg BID .    3. Syncope - vaso vagal due to cough  vs low BP. No further episode.      Vesta Mixer, Montez Hageman., MD, [facility] 12/16/2015, 11:14 AM [address],  [address] Office [phone] Pager [phone]

## 2015-12-16 NOTE — Discharge Summary (Signed)
Physician Discharge Summary  Tom Baker ZOX:[id number] DOB: 04/11/1956 DOA: 12/12/2015  PCP: Laurena Slimmer, MD  Admit date: 12/12/2015 Discharge date: 12/16/2015  Recommendations for Outpatient Follow-up:  Continue colchicine and aspirin as prescribed on discharge.  Discharge Diagnoses:  Principal Problem:   Syncope Active Problems:   Hypertension   Hypothyroidism   Diabetes mellitus type 1 (HCC)   Hyperlipidemia   Hypothyroid   Pericardial effusion   Atrial flutter (HCC)   Lobar pneumonia due to unspecified organism   Other specified hypothyroidism    Discharge Condition: stable   Diet recommendation: as tolerated   History of present illness:  60 y.o. male with past medical history significant for HTN, HLD, diabetes mellitus type 1, hypothyroidism who presented to [facility] ED status post syncopal event which is the 2nd one in past week. The firs syncopal event occurred while he was standing, no exertion other than coughing. The 2nd episode happened as he was coughing. He has had upper respiratory tract infection for past week or so. He saw his PCP and was given an abx for possible bronchitis.  Pt was hemodynamically stable on admission. He was found to have moderate pericardial effusion on CT scan Seen by cardio in consultation. He was started on IV Rocephin and azithromycin empirically for possible pneumonia.   Hospital Course:    Assessment & Plan:  Principal Problem:  Syncope / moderate pericardial effusion - Moderate effusion on CT scan and 2 D ECHO - 2 D ECHO repeated 6/12 and looks pretty much the same - Started colchicine 0.6 mg BID on 12/15/2015 - No plan for pericardiocentesis or pericardial window at this time. Patient will continue aspirin and colchicine on discharge as recommended by cardiology  Active Problems:  Possible lobar pneumonia - Continue azithro and Rocephin, today is day#5 of abx treatment  - Blood cultures not obtained on admission - No  fevers and a normal white blood cell count so we will stop antibiotics prior to discharge   Hypertension, essential - Continue norvasc and losartan   Hypothyroidism - Continue synthroid - TSH WNL   Diabetes mellitus type 1 without complications with long term insulin use (HCC) - Continue insulin pump    Dyslipidemia associated with DM - Continue stain therapy   Atrial flutter, atrial fibrillation (HCC) - CHADS vasc score 2  - Patient was on heparin drip in hospital. Per cardiology okay while he is monitored however risk of hematoma considering the size of pericardial effusion so prefer to go home with aspirin. - Patient is sinus rhythm - For heart rate control he will continue metoprolol   DVT prophylaxis: on heparin drip  Code Status: full code  Family Communication: spoke with pt wife at the bedside extensively about what cardiology has already told them, we talked about medications and discharge including colchicine and aspirin and no other blood thinners. Follow-up in cardiology office when cardiology set up the appointment.    Consultants:   Cardiology  Procedures:   2 D ECHO 12/13/2015 - EF 60%, moderate pericardial effusion mostly posteriorly  2 D ECHO 12/15/2015 - EF 60%, moderate pericardial effusion without evidence of hemodynamic compromise  Antimicrobials:   Azithromycin and rocephin 12/12/2015 --> 12/16/2015  Signed:  Manson Passey, MD  [facility] 12/16/2015, 11:44 AM  Pager #: [phone]  Time spent in minutes: more than 30 minutes    Discharge Exam: Filed Vitals:   12/15/15 2144 12/16/15 0348  BP: 122/61 127/73  Pulse: 100   Temp: 98.9 F (37.2  C) 98.2 F (36.8 C)  Resp: 18    Filed Vitals:   12/15/15 1238 12/15/15 2144 12/16/15 0348 12/16/15 0556  BP: 121/71 122/61 127/73   Pulse: 102 100    Temp: 99.2 F (37.3 C) 98.9 F (37.2 C) 98.2 F (36.8 C)   TempSrc: Oral Oral    Resp: 19 18    Height:      Weight:    97.8  kg (215 lb 9.8 oz)  SpO2: 99% 100% 95%     General: Pt is alert, follows commands appropriately, not in acute distress Cardiovascular: Rate controlled , S1/S2 +, friction tub (+) Respiratory: Clear to auscultation bilaterally, no wheezing, no crackles, no rhonchi Abdominal: Soft, non tender, non distended, bowel sounds +, no guarding Extremities: no edema, no cyanosis, pulses palpable bilaterally DP and PT Neuro: Grossly nonfocal  Discharge Instructions  Discharge Instructions    Call MD for:  difficulty breathing, headache or visual disturbances    Complete by:  As directed      Call MD for:  persistant dizziness or light-headedness    Complete by:  As directed      Call MD for:  persistant nausea and vomiting    Complete by:  As directed      Call MD for:  severe uncontrolled pain    Complete by:  As directed      Diet - low sodium heart healthy    Complete by:  As directed      Discharge instructions    Complete by:  As directed   Continue colchicine and aspirin as prescribed on discharge.     Increase activity slowly    Complete by:  As directed             Medication List    TAKE these medications        amLODipine 5 MG tablet  Commonly known as:  NORVASC  Take 1 tablet (5 mg total) by mouth daily.  Start taking on:  12/17/2015     aspirin EC 81 MG tablet  Take 1 tablet (81 mg total) by mouth daily.     atorvastatin 10 MG tablet  Commonly known as:  LIPITOR  Take 10 mg by mouth daily.     colchicine 0.6 MG tablet  Take 1 tablet (0.6 mg total) by mouth 2 (two) times daily.     Insulin Infusion Pump Devi  by Does not apply route. Novolog Insulin     levothyroxine 125 MCG tablet  Commonly known as:  SYNTHROID, LEVOTHROID  Take 125 mcg by mouth daily.     losartan 100 MG tablet  Commonly known as:  COZAAR  Take 100 mg by mouth daily.     metoprolol tartrate 25 MG tablet  Commonly known as:  LOPRESSOR  Take 1 tablet (25 mg total) by mouth 2 (two) times  daily.     multivitamin tablet  Take 1 tablet by mouth daily.     PRESCRIPTION MEDICATION  Medication Olive oil : 30ml by mouth daily     Vitamin D3 2000 units Tabs  Take 1 tablet by mouth daily.           Follow-up Information    Follow up with Cline Crock, PA-C. Go on 01/02/2016.   Specialties:  Cardiology, Radiology   Why:  @2 :00 pm for echocardiogram @3 :30 for post hosptial follow up   Contact information:   [address] [place] [address] [phone]  Follow up with Laurena Slimmer, MD. Schedule an appointment as soon as possible for a visit in 2 weeks.   Specialty:  Internal Medicine   Why:  Follow up appt after recent hospitalization   Contact information:   [address] Sh[address] [place] [address] [phone]        The results of significant diagnostics from this hospitalization (including imaging, microbiology, ancillary and laboratory) are listed below for reference.    Significant Diagnostic Studies: Dg Chest 2 View  12/12/2015  CLINICAL DATA:  Syncope and cough. EXAM: CHEST  2 VIEW COMPARISON:  December 10, 2015 FINDINGS: Stable cardiomegaly. The hila and mediastinum are unchanged. No pneumothorax. No pulmonary nodules or masses. Mild opacity and probable small effusions seen in left base common and inguinal. No other interval changes or acute abnormalities. IMPRESSION: Probable small left effusion with underlying opacity. Recommend follow-up to resolution. Electronically Signed   By: Gerome Sam III M.D   On: 12/12/2015 20:34   Dg Chest 2 View  12/10/2015  CLINICAL DATA:  Cough x1 week, fever, dizziness EXAM: CHEST  2 VIEW COMPARISON:  None. FINDINGS: Lungs are clear.  No pleural effusion or pneumothorax. Cardiomegaly. Visualized osseous structures are within normal limits. IMPRESSION: No evidence of acute cardiopulmonary disease. Electronically Signed   By: Charline Bills M.D.   On: 12/10/2015 14:43   Ct Angio  Chest Pe W/cm &/or Wo Cm  12/13/2015  CLINICAL DATA:  60 year old male with elevated D-dimer, syncope, and collapse. EXAM: CT ANGIOGRAPHY CHEST WITH CONTRAST TECHNIQUE: Multidetector CT imaging of the chest was performed using the standard protocol during bolus administration of intravenous contrast. Multiplanar CT image reconstructions and MIPs were obtained to evaluate the vascular anatomy. CONTRAST:  60 cc Isovue 370 COMPARISON:  Chest radiograph dated 12/12/2015 FINDINGS: There small bilateral pleural effusions, left greater than right. There is diffuse interstitial prominence and airspace densities, likely congestive changes or edema. Superimposed pneumonia is not excluded. Bibasilar subsegmental compressive atelectasis. There is no pneumothorax. The central airways are patent. The thoracic aorta appears unremarkable. The origins of the great vessels of the aortic arch appear patent. Top-normal cardiac size. There is coronary vascular calcification. There is a moderate size pericardial effusion measuring up to 2.5 cm in greatest transverse axial dimension. Correlation with clinical exam and echocardiogram recommended. There is no CT evidence of pulmonary embolism. Top-normal bilateral hilar lymph nodes, likely reactive. Esophagus is grossly unremarkable. There is no axillary adenopathy. The chest wall soft tissues appear unremarkable. There is mild degenerative changes of the spine. No acute fracture. Cholecystectomy. The visualized upper abdomen is otherwise unremarkable. Review of the MIP images confirms the above findings. IMPRESSION: No CT evidence of pulmonary embolism. Moderate size pericardial effusion. Correlation with clinical exam and echocardiogram recommended. Small bilateral pleural effusions with congestive changes. Superimposed pneumonia is not excluded. These results were called by telephone at the time of interpretation on 12/13/2015 at 3:16 am to nurse Elijah Birk who verbally acknowledged these  results. Electronically Signed   By: Elgie Collard M.D.   On: 12/13/2015 03:19    Microbiology: No results found for this or any previous visit (from the past 240 hour(s)).   Labs: Basic Metabolic Panel:  Recent Labs Lab 12/12/15 1646 12/13/15 0329  NA 135 137  K 3.9 4.0  CL 103 103  CO2 23 27  GLUCOSE 85 96  BUN 12 8  CREATININE 1.29* 1.19  CALCIUM 9.1 8.8*   Liver Function Tests: No results for input(s): AST, ALT,  ALKPHOS, BILITOT, PROT, ALBUMIN in the last 168 hours. No results for input(s): LIPASE, AMYLASE in the last 168 hours. No results for input(s): AMMONIA in the last 168 hours. CBC:  Recent Labs Lab 12/12/15 1646 12/13/15 0329 12/14/15 0215 12/15/15 0232 12/16/15 0337  WBC 7.0 6.6 8.1 8.2 7.7  HGB 12.8* 11.8* 12.9* 12.1* 12.0*  HCT 38.4* 35.7* 38.5* 36.8* 36.8*  MCV 82.8 82.8 83.0 83.4 83.6  PLT 437* 432* 450* 500* 438*   Cardiac Enzymes:  Recent Labs Lab 12/12/15 2148 12/13/15 0329 12/13/15 0958  TROPONINI <0.03 <0.03 <0.03   BNP: BNP (last 3 results) No results for input(s): BNP in the last 8760 hours.  ProBNP (last 3 results) No results for input(s): PROBNP in the last 8760 hours.  CBG:  Recent Labs Lab 12/15/15 1235 12/15/15 1745 12/15/15 2141 12/16/15 0819 12/16/15 0940  GLUCAP 88 89 118* 37* 162*

## 2015-12-16 NOTE — Progress Notes (Signed)
ANTICOAGULATION CONSULT NOTE Pharmacy Consult for Heparin Indication: atrial fibrillation  No Known Allergies  Patient Measurements: Height: 6\' 3"  (190.5 cm) Weight: 214 lb 1.6 oz (97.115 kg) IBW/kg (Calculated) : 84.5  Vital Signs: Temp: 98.2 F (36.8 C) (06/13 0348) Temp Source: Oral (06/12 2144) BP: 127/73 mmHg (06/13 0348) Pulse Rate: 100 (06/12 2144)  Labs:  Recent Labs  12/13/15 0958  12/14/15 0215  12/14/15 1935 12/15/15 0232 12/16/15 0337  HGB  --   < > 12.9*  --   --  12.1* 12.0*  HCT  --   --  38.5*  --   --  36.8* 36.8*  PLT  --   --  450*  --   --  500* 438*  HEPARINUNFRC  --   < > 0.29*  < > 0.45 0.48 0.21*  TROPONINI <0.03  --   --   --   --   --   --   < > = values in this interval not displayed.  Estimated Creatinine Clearance: 78.9 mL/min (by C-G formula based on Cr of 1.19).  Assessment: 60 y.o. male with Afib for heparin  Goal of Therapy:  Heparin level 0.3-0.7 units/ml Monitor platelets by anticoagulation protocol: Yes   Plan:  Increase Heparin 1850 units/hr Check heparin level in 8 hours.   Eddie Candlebbott, Syniah Berne Vernon 12/16/2015,5:25 AM

## 2015-12-16 NOTE — Progress Notes (Addendum)
Patient ID: Tom Baker, male   DOB: 09/21/1955, 60 y.o.   MRN: [id number]  PROGRESS NOTE    Tom Baker  [id number] DOB: 09/21/1955 DOA: 12/12/2015  PCP: Laurena SlimmerLARK,PRESTON S, MD   Brief Narrative:  60 y.o. male with past medical history significant for HTN, HLD, diabetes mellitus type 1, hypothyroidism who [facility] status post syncopal event which is the 2nd one in past week. The firs syncopal event occurred while he was standing, no exertion other than coughing. The 2nd episode happened as he was coughing. He has had upper respiratory tract infection for past week or so. He saw his PCP and was given an abx for possible bronchitis.  Pt was hemodynamically stable on admission. He was found to have moderate pericardial effusion on CT scan Seen by cardio in consultation. He was started on IV Rocephin and azithromycin empirically for possible pneumonia.   Assessment & Plan:   Principal Problem:   Syncope / moderate pericardial effusion - Moderate effusion on CT scan and 2 D ECHO - 2 D ECHO repeated 6/12 and looks pretty much the same - Started colchicine 0.6 mg BID on 12/15/2015 - Cardiology is following, will follow upon further plan of care in regards to effusion management   Active Problems:   Possible lobar pneumonia - Continue azithro and Rocephin, today is day#5 of abx treatment  - Blood cultures not obtained on admission    Hypertension, essential - Continue norvasc and losartan    Hypothyroidism - Continue synthroid - TSH WNL    Diabetes mellitus type 1 without complications with long term insulin use (HCC) - Continue insulin pump     Dyslipidemia associated with DM - Continue stain therapy    Atrial flutter, atrial fibrillation (HCC) - CHADS vasc score 2  - Pt is on heparin drip - Will follow up on cardio recommendations    DVT prophylaxis: on heparin drip  Code Status: full code  Family Communication: spoke with pt wife at the bedside Disposition Plan:  home once cleared by cardiology    Consultants:   Cardiology   Procedures:   2 D ECHO 12/13/2015 - EF 60%, moderate pericardial effusion mostly posteriorly  2 D ECHO 12/15/2015 - EF 60%, moderate pericardial effusion without evidence of hemodynamic compromise   Antimicrobials:   Azithromycin and rocephin 12/12/2015 -->   Subjective: No overnight events.  Objective: Filed Vitals:   12/15/15 1238 12/15/15 2144 12/16/15 0348 12/16/15 0556  BP: 121/71 122/61 127/73   Pulse: 102 100    Temp: 99.2 F (37.3 C) 98.9 F (37.2 C) 98.2 F (36.8 C)   TempSrc: Oral Oral    Resp: 19 18    Height:      Weight:    97.8 kg (215 lb 9.8 oz)  SpO2: 99% 100% 95%     Intake/Output Summary (Last 24 hours) at 12/16/15 0647 Last data filed at 12/15/15 2145  Gross per 24 hour  Intake   1080 ml  Output      0 ml  Net   1080 ml   Filed Weights   12/14/15 0434 12/15/15 0500 12/16/15 0556  Weight: 97.206 kg (214 lb 4.8 oz) 97.115 kg (214 lb 1.6 oz) 97.8 kg (215 lb 9.8 oz)    Examination:  General exam: no acute distress  Respiratory system: no wheezing, no rhonchi  Cardiovascular system: S1 & S2 (+), rhythm irregular, (+) friction rub Gastrointestinal system: (+) BS, non tender, non distended  Central nervous system:  No focal deficits  Extremities: no edema, palpable pulses  Skin: warm and dry    Psychiatry: normal mood and behavior   Data Reviewed: I have personally reviewed following labs and imaging studies  CBC:  Recent Labs Lab 12/12/15 1646 12/13/15 0329 12/14/15 0215 12/15/15 0232 12/16/15 0337  WBC 7.0 6.6 8.1 8.2 7.7  HGB 12.8* 11.8* 12.9* 12.1* 12.0*  HCT 38.4* 35.7* 38.5* 36.8* 36.8*  MCV 82.8 82.8 83.0 83.4 83.6  PLT 437* 432* 450* 500* 438*   Basic Metabolic Panel:  Recent Labs Lab 12/12/15 1646 12/13/15 0329  NA 135 137  K 3.9 4.0  CL 103 103  CO2 23 27  GLUCOSE 85 96  BUN 12 8  CREATININE 1.29* 1.19  CALCIUM 9.1 8.8*   GFR: Estimated  Creatinine Clearance: 78.9 mL/min (by C-G formula based on Cr of 1.19). Liver Function Tests: No results for input(s): AST, ALT, ALKPHOS, BILITOT, PROT, ALBUMIN in the last 168 hours. No results for input(s): LIPASE, AMYLASE in the last 168 hours. No results for input(s): AMMONIA in the last 168 hours. Coagulation Profile: No results for input(s): INR, PROTIME in the last 168 hours. Cardiac Enzymes:  Recent Labs Lab 12/12/15 2148 12/13/15 0329 12/13/15 0958  TROPONINI <0.03 <0.03 <0.03   BNP (last 3 results) No results for input(s): PROBNP in the last 8760 hours. HbA1C: No results for input(s): HGBA1C in the last 72 hours. CBG:  Recent Labs Lab 12/15/15 0613 12/15/15 0810 12/15/15 1235 12/15/15 1745 12/15/15 2141  GLUCAP 88 73 88 89 118*   Lipid Profile: No results for input(s): CHOL, HDL, LDLCALC, TRIG, CHOLHDL, LDLDIRECT in the last 72 hours. Thyroid Function Tests:  Recent Labs  12/14/15 1231  TSH 2.288   Anemia Panel: No results for input(s): VITAMINB12, FOLATE, FERRITIN, TIBC, IRON, RETICCTPCT in the last 72 hours. Urine analysis:    Component Value Date/Time   COLORURINE YELLOW 12/12/2015 1750   APPEARANCEUR CLEAR 12/12/2015 1750   LABSPEC 1.011 12/12/2015 1750   PHURINE 5.5 12/12/2015 1750   GLUCOSEU NEGATIVE 12/12/2015 1750   HGBUR NEGATIVE 12/12/2015 1750   BILIRUBINUR NEGATIVE 12/12/2015 1750   KETONESUR NEGATIVE 12/12/2015 1750   PROTEINUR NEGATIVE 12/12/2015 1750   NITRITE NEGATIVE 12/12/2015 1750   LEUKOCYTESUR NEGATIVE 12/12/2015 1750   Sepsis Labs: @LABRCNTIP (procalcitonin:4,lacticidven:4)   )No results found for this or any previous visit (from the past 240 hour(s)).    Radiology Studies: Dg Chest 2 View 12/12/2015 Probable small left effusion with underlying opacity. Recommend follow-up to resolution.   Dg Chest 2 View 12/10/2015  No evidence of acute cardiopulmonary disease.   Ct Angio Chest Pe W/cm &/or Wo Cm 12/13/2015  No CT  evidence of pulmonary embolism. Moderate size pericardial effusion. Correlation with clinical exam and echocardiogram recommended. Small bilateral pleural effusions with congestive changes. Superimposed pneumonia is not excluded.   Scheduled Meds: . amLODipine  10 mg Oral Daily  . atorvastatin  10 mg Oral Daily  . azithromycin  500 mg Intravenous Q24H  . cefTRIAXone (ROCEPHIN)  IV  2 g Intravenous Q24H  . chlorpheniramine-HYDROcodone  5 mL Oral Q12H  . cholecalciferol  2,000 Units Oral Daily  . colchicine  0.6 mg Oral BID  . insulin pump   Subcutaneous TID AC, HS, 0200  . levothyroxine  125 mcg Oral QAC breakfast  . losartan  100 mg Oral Daily  . multivitamin with minerals  1 tablet Oral Daily   Continuous Infusions: . sodium chloride 100 mL/hr at 12/13/15 1253  .  heparin 1,850 Units/hr (12/16/15 0612)     LOS: 1 day   Time spent: 25 minutes  Greater than 50% of the time spent on counseling and coordinating the care.   Manson Passey, MD [facility] Pager [phone]  If 7PM-7AM, please contact night-coverage [email or web address] Password [facility] 12/16/2015, 6:47 AM

## 2015-12-19 ENCOUNTER — Other Ambulatory Visit (HOSPITAL_COMMUNITY): Payer: BC Managed Care – PPO

## 2016-01-02 ENCOUNTER — Ambulatory Visit (HOSPITAL_COMMUNITY): Payer: BC Managed Care – PPO | Attending: Cardiology

## 2016-01-02 ENCOUNTER — Ambulatory Visit (INDEPENDENT_AMBULATORY_CARE_PROVIDER_SITE_OTHER): Payer: BC Managed Care – PPO | Admitting: Physician Assistant

## 2016-01-02 ENCOUNTER — Encounter: Payer: Self-pay | Admitting: Physician Assistant

## 2016-01-02 ENCOUNTER — Other Ambulatory Visit: Payer: Self-pay

## 2016-01-02 VITALS — BP 100/60 | HR 84 | Ht 75.5 in | Wt 204.8 lb

## 2016-01-02 DIAGNOSIS — I319 Disease of pericardium, unspecified: Secondary | ICD-10-CM | POA: Insufficient documentation

## 2016-01-02 DIAGNOSIS — I4892 Unspecified atrial flutter: Secondary | ICD-10-CM | POA: Diagnosis not present

## 2016-01-02 DIAGNOSIS — R0602 Shortness of breath: Secondary | ICD-10-CM

## 2016-01-02 DIAGNOSIS — I10 Essential (primary) hypertension: Secondary | ICD-10-CM

## 2016-01-02 DIAGNOSIS — E785 Hyperlipidemia, unspecified: Secondary | ICD-10-CM | POA: Diagnosis not present

## 2016-01-02 DIAGNOSIS — I313 Pericardial effusion (noninflammatory): Secondary | ICD-10-CM | POA: Diagnosis present

## 2016-01-02 DIAGNOSIS — I5189 Other ill-defined heart diseases: Secondary | ICD-10-CM | POA: Diagnosis not present

## 2016-01-02 DIAGNOSIS — I3139 Other pericardial effusion (noninflammatory): Secondary | ICD-10-CM

## 2016-01-02 LAB — ECHOCARDIOGRAM LIMITED
FS: 39 % (ref 28–44)
IVS/LV PW RATIO, ED: 0.94
LA ID, A-P, ES: 39 mm
LA diam index: 1.71 cm/m2
LEFT ATRIUM END SYS DIAM: 39 mm
PW: 10.2 mm — AB (ref 0.6–1.1)
Reg peak vel: 242 cm/s
TR max vel: 242 cm/s

## 2016-01-02 MED ORDER — COLCHICINE 0.6 MG PO TABS: 0.6000 mg | ORAL_TABLET | Freq: Two times a day (BID) | ORAL | Status: DC

## 2016-01-02 MED ORDER — RIVAROXABAN 20 MG PO TABS: 20.0000 mg | ORAL_TABLET | Freq: Every day | ORAL | Status: DC

## 2016-01-02 NOTE — Progress Notes (Signed)
Cardiology Office Note    Date:  01/02/2016   ID:  Tom LoryDaniel Killings, DOB 01/15/1956, MRN [id number]  PCP:  Laurena SlimmerLARK,PRESTON S, MD  Cardiologist:  Dr. Duke Salviaandolph  CC: Post hospital f/u  History of Present Illness:  Tom Baker is a 60 y.o. male with a history of DMT1, hypothyroidism, HTN and HLD who presents to clinic for post hospital follow up after a recent admission for syncope, new onset atrial fib/flutter and pericardial effusion.   He was seen by Dr. Duke Salviaandolph on 11/06/15 as a new patient due to long standing diabetes mellitus and concern for possible cardiovascular complications. He had a Lexiscan Myoview that was negative for ischemia and echo was pending.  He had He [facility] 12[facility]13/15 after two syncopal episodes while coughing. Of note he has had a "bronchial" infection for several days with some cough non productive.  In the ED the D dimer was elevated.CT to rule out PE demonstrated a moderate sized pericardial effusion and no pulmonary embolism. He was also found to be in atrial flutter, which was a new diagnosis for him. 2D ECHO on 12/13/15 showed normal LV function and confirmed mod pericardial effusion with no evidence of tamponade. Repeat 2D ECHO on 12/15/15 showed no significant change. This was felt to be related to recent URI. He was placed on ASA and colchicine. His PAF was felt to be related to pericardial inflammation. CHADSVASC score of at least 2 (HTN, DM). He was placed on heparin while admitted, but starting a NOAC was deffered until after his pericarditis/pericardial effusion resolved to avoid hemopericardium. He spontaneously converted to NSR.  He was started on Metoprolol 25 mg BID and amolodipine decreased from 10mg  --> 5mg  daily. His syncope was felt to be vasovagal 2/2 coughing.  Today he presents to clinic for follow up. He had limited ECHO today before our appointment which showed resolution of pericardial effusion (Dr. Ludwig Clarksrenshaw's verbal report and formal read in  process). He has been doing well with no chest pain or SOB. He has felt like his "equlibrium has been a little off," but no dizziness or recurrent syncope. No LE edema, orthopnea or PND. No blood in stool or urine. Overall, he is doing well.     Past Medical History  Diagnosis Date  . Diabetes mellitus type 1 (HCC)   . Hypertension   . Hyperlipidemia   . Hypothyroid     Past Surgical History  Procedure Laterality Date  . Toe surgery    . Ankle surgery    . Knee surgery    . Cholecystectomy      Current Medications: Outpatient Prescriptions Prior to Visit  Medication Sig Dispense Refill  . amLODipine (NORVASC) 5 MG tablet Take 1 tablet (5 mg total) by mouth daily. 30 tablet 0  . Cholecalciferol (VITAMIN D3) 2000 units TABS Take 1 tablet by mouth daily.    . Insulin Infusion Pump DEVI by Does not apply route. Novolog Insulin    . levothyroxine (SYNTHROID, LEVOTHROID) 125 MCG tablet Take 125 mcg by mouth daily.      Marland Kitchen. losartan (COZAAR) 100 MG tablet Take 100 mg by mouth daily.      . metoprolol tartrate (LOPRESSOR) 25 MG tablet Take 1 tablet (25 mg total) by mouth 2 (two) times daily. 60 tablet 0  . Multiple Vitamin (MULTIVITAMIN) tablet Take 1 tablet by mouth daily.    Marland Kitchen. PRESCRIPTION MEDICATION Medication Olive oil : 30ml by mouth daily    . aspirin  EC 81 MG tablet Take 1 tablet (81 mg total) by mouth daily. 30 tablet 0  . colchicine 0.6 MG tablet Take 1 tablet (0.6 mg total) by mouth 2 (two) times daily. 60 tablet 0  . atorvastatin (LIPITOR) 10 MG tablet Take 10 mg by mouth daily. Reported on 01/02/2016     No facility-administered medications prior to visit.     Allergies:   Review of patient's allergies indicates no known allergies.   Social History   Social History  . Marital Status: Married    Spouse Name: N/A  . Number of Children: N/A  . Years of Education: N/A   Social History Main Topics  . Smoking status: Never Smoker   . Smokeless tobacco: None  . Alcohol  Use: No     Comment: last used 1992  . Drug Use: No  . Sexual Activity: Not Asked   Other Topics Concern  . None   Social History Narrative   Works for [employer].      Family History:  The patient's family history includes Bone cancer in his father.     ROS:   Please see the history of present illness.    ROS All other systems reviewed and are negative.   PHYSICAL EXAM:   VS:  BP 100/60 mmHg  Pulse 84  Ht 6' 3.5" (1.918 m)  Wt 204 lb 12.8 oz (92.897 kg)  BMI 25.25 kg/m2   GEN: Well nourished, well developed, in no acute distress HEENT: normal Neck: no JVD, carotid bruits, or masses Cardiac: RRR; no murmurs, rubs, or gallops,no edema  Respiratory:  clear to auscultation bilaterally, normal work of breathing GI: soft, nontender, nondistended, + BS MS: no deformity or atrophy Skin: warm and dry, no rash Neuro:  Alert and Oriented x 3, Strength and sensation are intact Psych: euthymic mood, full affect  Wt Readings from Last 3 Encounters:  01/02/16 204 lb 12.8 oz (92.897 kg)  12/16/15 215 lb 9.8 oz (97.8 kg)  12/05/15 217 lb (98.431 kg)      Studies/Labs Reviewed:   EKG:  EKG is ordered today.  The ekg ordered today demonstrates NSR with diffuse TW inversions, new from previous  Recent Labs: 12/13/2015: BUN 8; Creatinine, Ser 1.19; Potassium 4.0; Sodium 137 12/14/2015: TSH 2.288 12/16/2015: Hemoglobin 12.0*; Platelets 438*   Lipid Panel No results found for: CHOL, TRIG, HDL, CHOLHDL, VLDL, LDLCALC, LDLDIRECT  Additional studies/ records that were reviewed today include:  2D ECHO  12/15/2015 LV EF: 65% -   70% Study Conclusions - Left ventricle: The cavity size was normal. There was mild focal   basal hypertrophy of the septum. Systolic function was vigorous.   The estimated ejection fraction was in the range of 65% to 70%.   Wall motion was normal; there were no regional wall motion   abnormalities. - Aorta: The aorta was mildly calcified. -  Mitral valve: There was trivial regurgitation. - Tricuspid valve: There was mild regurgitation. - Pericardium, extracardiac: A moderate, free-flowing pericardial   effusion (1.15cm) was identified circumferential to the heart.   There was no evidence of hemodynamic compromise. Impressions: - Compared to the prior study, there has been no significant   interval change.  12/05/15 Study Highlights     The left ventricular ejection fraction is hyperdynamic (>65%).  Nuclear stress EF: 69%.  Persistent diffuse ST elevation consistent with early repolarization.Marland Kitchen No change from baseline EKG  The study is normal.  This is a low risk study.  ASSESSMENT & PLAN:   Tom LoryDaniel Lipsitz is a 60 y.o. male with a history of DMT1, hypothyroidism, HTN and HLD who presents to clinic for post hospital follow up after a recent admission for syncope, new onset atrial fib/flutter and pericardial effusion.   Pericardiac effusion - No clinical features of tamponade. TSH normal. Felt to be related to URI/pneumonia.  - Limited echo today with resolution of pericardial effusion.  - Continue colchicine 0.6mg  BID x 3 months. Stop ASA due to starting Xarelto for atrial flutter.  PAFlutter: maintaining NSR today - likely due to pericardial inflammation or recent URI/pneumonia.  - CHA2DS2 - VASc score of 2 with a risk of stroke of 2.2%. NOAC initially deferred until after his pericarditis/pericardial effusion (to avoid bleeding into the pericardium). Now that he has resolution of pericardial effusion, will start Xarelto 20mg  daily.   Syncope: felt to be vaso vagal due to cough vs low BP. No further episodes  Hypothyroidism: recent TSH normal   HTN: BP well controlled on current regimen   DMT1: continue insulin pump. No known complications. Recent myoview low risk   Polypharmacy: patient reported his PCP was called about possible interaction between atorvastatin and one of his other medications, but he  wasn't sure which one. I reviewed his medication list with our pharmacist and there are no concerning drug drug interactions at their current doses. We will let Dr. Chestine Sporelark know.    Medication Adjustments/Labs and Tests Ordered: Current medicines are reviewed at length with the patient today.  Concerns regarding medicines are outlined above.  Medication changes, Labs and Tests ordered today are listed in the Patient Instructions below. Patient Instructions  Medication Instructions:  Your physician has recommended you make the following change in your medication:  1.  START the Xarelto 20 mg taking 1 tablet daily 2.  STOP the Apsirin  We have sent a refill to your local pharmacy for the Colchicine  Labwork: None ordered  Testing/Procedures: None ordered  Follow-Up: .Your physician recommends that you schedule a follow-up appointment in: SEE. DR. Duke SalviaANDOLPH AS PLANNED   Any Other Special Instructions Will Be Listed Below (If Applicable).   If you need a refill on your cardiac medications before your next appointment, please call your pharmacy.       Signed, Cline CrockKathryn Kevia Zaucha, PA-C  01/02/2016 4:16 PM    [facility] [address], [place], [place]  [address] Phone: [phone]; Fax: [phone]

## 2016-01-02 NOTE — Patient Instructions (Addendum)
Medication Instructions:  Your physician has recommended you make the following change in your medication:  1.  START the Xarelto 20 mg taking 1 tablet daily 2.  STOP the Apsirin  We have sent a refill to your local pharmacy for the Colchicine  Labwork: None ordered  Testing/Procedures: None ordered  Follow-Up: .Your physician recommends that you schedule a follow-up appointment in: SEE. DR. Duke SalviaANDOLPH AS PLANNED   Any Other Special Instructions Will Be Listed Below (If Applicable).   If you need a refill on your cardiac medications before your next appointment, please call your pharmacy.

## 2016-01-29 ENCOUNTER — Encounter: Payer: Self-pay | Admitting: Cardiovascular Disease

## 2016-01-29 ENCOUNTER — Ambulatory Visit (INDEPENDENT_AMBULATORY_CARE_PROVIDER_SITE_OTHER): Payer: BC Managed Care – PPO | Admitting: Cardiovascular Disease

## 2016-01-29 VITALS — BP 112/62 | HR 88 | Ht 75.5 in | Wt 206.0 lb

## 2016-01-29 DIAGNOSIS — I4892 Unspecified atrial flutter: Secondary | ICD-10-CM | POA: Diagnosis not present

## 2016-01-29 DIAGNOSIS — I10 Essential (primary) hypertension: Secondary | ICD-10-CM

## 2016-01-29 DIAGNOSIS — I319 Disease of pericardium, unspecified: Secondary | ICD-10-CM

## 2016-01-29 DIAGNOSIS — I313 Pericardial effusion (noninflammatory): Secondary | ICD-10-CM

## 2016-01-29 DIAGNOSIS — E785 Hyperlipidemia, unspecified: Secondary | ICD-10-CM

## 2016-01-29 DIAGNOSIS — I3139 Other pericardial effusion (noninflammatory): Secondary | ICD-10-CM

## 2016-01-29 MED ORDER — ROSUVASTATIN CALCIUM 5 MG PO TABS: 5.0000 mg | ORAL_TABLET | Freq: Every day | ORAL | 1 refills | Status: DC

## 2016-01-29 NOTE — Progress Notes (Signed)
Cardiology Office Note   Date:  01/29/2016   ID:  Derry Lory, DOB December 31, 1955, MRN [id number]  PCP:  Laurena Slimmer, MD  Cardiologist:   Chilton Si, MD   Chief Complaint  Patient presents with  . Follow-up      History of Present Illness: Tom Baker is a 60 y.o. male with diabetes mellitus type 1, hypertension, and hyperlipidemia who presents for follow up.  Tom Baker was initially seen 11/2015 with lower extremity edema. He was referred for [facility] on 12/2015 that was negative for ischemia.  He was admitted to the hospital 6/9-6/13 with cough-variant syncope in the setting of pneumonia.  He was noted to have a moderate pericardial effusion and was started on colchicine and aspirin.  He denied chest pain at that time.  He was also noted to have atrial flutter and was treated with aspirin only due to concern of hemorrhagic conversion of his pericardial effusion.  Tom Baker followed up in clinic and his repeat echo showed resolution of the effusion. He was started on Xarelto at that time.  Tom Baker denies chest pain, shortness of breath, lower extremity edema, orthopnea or PND.  He also denies any recurrent palpitations since discharge.  He is concerned because a friend recently had a heart attack and had recently had a negative stress test.     Past Medical History:  Diagnosis Date  . Diabetes mellitus type 1 (HCC)   . Hyperlipidemia   . Hypertension   . Hypothyroid     Past Surgical History:  Procedure Laterality Date  . ANKLE SURGERY    . CHOLECYSTECTOMY    . KNEE SURGERY    . TOE SURGERY       Current Outpatient Prescriptions  Medication Sig Dispense Refill  . amLODipine (NORVASC) 5 MG tablet Take 1 tablet (5 mg total) by mouth daily. 30 tablet 0  . Cholecalciferol (VITAMIN D3) 2000 units TABS Take 1 tablet by mouth daily.    . Insulin Infusion Pump DEVI by Does not apply route. Novolog Insulin    . Lancets (ONETOUCH ULTRASOFT) lancets TEST six times a day   0  . levothyroxine (SYNTHROID, LEVOTHROID) 125 MCG tablet Take 125 mcg by mouth daily.      Marland Kitchen losartan (COZAAR) 100 MG tablet Take 100 mg by mouth daily.      . Multiple Vitamin (MULTIVITAMIN) tablet Take 1 tablet by mouth daily.    Marland Kitchen NOVOLOG 100 UNIT/ML injection USE 50 UNITS IN INSULIN PUMP ONCE DAILY  0  . PRESCRIPTION MEDICATION Medication Olive oil : 44ml by mouth daily    . rivaroxaban (XARELTO) 20 MG TABS tablet Take 1 tablet (20 mg total) by mouth daily with supper. 90 tablet 3  . rosuvastatin (CRESTOR) 5 MG tablet Take 1 tablet (5 mg total) by mouth daily. 90 tablet 1   No current facility-administered medications for this visit.     Allergies:   Review of patient's allergies indicates no known allergies.    Social History:  The patient  reports that he has never smoked. He does not have any smokeless tobacco history on file. He reports that he does not drink alcohol or use drugs.   Family History:  The patient's family history includes Bone cancer in his father.    ROS:  Please see the history of present illness.   Otherwise, review of systems are positive for none.   All other systems are reviewed and negative.    PHYSICAL  EXAM: VS:  BP 112/62   Pulse 88   Ht 6' 3.5" (1.918 m)   Wt 206 lb (93.4 kg)   BMI 25.41 kg/m  , BMI Body mass index is 25.41 kg/m. GENERAL:  Well appearing HEENT:  Pupils equal round and reactive, fundi not visualized, oral mucosa unremarkable NECK:  No jugular venous distention, waveform within normal limits, carotid upstroke brisk and symmetric, no bruit  LYMPHATICS:  No cervical adenopathy LUNGS:  Clear to auscultation bilaterally HEART:  RRR.  PMI not displaced or sustained,S1 and S2 within normal limits, no S3, no S4, no clicks, no rubs, no murmurs ABD:  Flat, positive bowel sounds normal in frequency in pitch, no bruits, no rebound, no guarding, no midline pulsatile mass, no hepatomegaly, no splenomegaly EXT:  2 plus pulses throughout, no  edema, no cyanosis no clubbing SKIN:  No rashes no nodules NEURO:  Cranial nerves II through XII grossly intact, motor grossly intact throughout PSYCH:  Cognitively intact, oriented to person place and time   EKG:  EKG is ordered today. The ekg ordered today demonstrates sinus rhythm rate 88 bpm.  R axis deviation.  Echo 12/13/15: Study Conclusions  - Left ventricle: The cavity size was normal. Systolic function was   normal. The estimated ejection fraction was in the range of 60%   to 65%. Wall motion was normal; there were no regional wall   motion abnormalities. - Atrial septum: No defect or patent foramen ovale was identified. - Pericardium, extracardiac: Moderate pericardial effusion mostly   posteriro and would be hard to reach percutaneously   IVC small with collapse on sniff. No evidence of tamponade.  Lexiscan Myoview 12/13/15:  The left ventricular ejection fraction is hyperdynamic (>65%).  Nuclear stress EF: 69%.  Persistent diffuse ST elevation consistent with early repolarization.Marland Kitchen No change from baseline EKG  The study is normal.  This is a low risk study.  Recent Labs: 12/13/2015: BUN 8; Creatinine, Ser 1.19; Potassium 4.0; Sodium 137 12/14/2015: TSH 2.288 12/16/2015: Hemoglobin 12.0; Platelets 438    Lipid Panel No results found for: CHOL, TRIG, HDL, CHOLHDL, VLDL, LDLCALC, LDLDIRECT    Wt Readings from Last 3 Encounters:  01/29/16 206 lb (93.4 kg)  01/02/16 204 lb 12.8 oz (92.9 kg)  12/16/15 215 lb 9.8 oz (97.8 kg)      ASSESSMENT AND PLAN:  # CV Risk: Stress test Was negative for ischemia. He is not on aspirin due to being on Xarelto.  He was previously on atorvastatin but this was not filled by his pharmacy due to concern about a reaction with amlodipine. He reports that the last couple times he took the atorvastatin he had some lightheadedness. We will start rosuvastatin 5 mg daily. His cholesterol levels are actually quite good, however given that  he has diabetes  he is at higher cardiovascular risk.  He will follow-up with his primary care doctor next month and will have lipids and LFTs checked at that time.  # Paroxysmal atrial flutter: Tom Baker  is currently in sinus rhythm and feels as though he has been since hospitalization. It is likely that this occurred in the setting of his pneumonia and pericardial effusion. Hopefully he will not have any recurrent episodes. However, we will continue Xarelto for now. If he continues to remain free of atrial fibrillation or atrial flutter we'll consider stopping and restarting aspirin and follow-up. Xarelto   # Pericardial effusion: Resolved 6/30.  Likely viral.  It is not clear that he had  pericarditis.  We will stop colchicine today.  # Hypertension: BP well-controlled.  Continue amlodipine and losartan.  Current medicines are reviewed at length with the patient today.  The patient does not have concerns regarding medicines.  The following changes have been made:  no change  Labs/ tests ordered today include:   Orders Placed This Encounter  Procedures  . Lipid panel  . Comprehensive Metabolic Panel (CMET)  . EKG 12-Lead    Time spent: 25 minutes-Greater than 50% of this time was spent in counseling, explanation of diagnosis, planning of further management, and coordination of care.   Disposition:   FU with Marli Diego C. Duke Salvia, MD, [facility] in 6 months.     This note was written with the assistance of speech recognition software.  Please excuse any transcriptional errors.  Signed, Sujey Gundry C. Duke Salvia, MD, [facility]  01/29/2016 4:36 PM    [facility]

## 2016-01-29 NOTE — Patient Instructions (Addendum)
Medication Instructions:  STOP COLCHICINE   START CRESTOR 5 MG DAILY   Labwork: FASTING LP/CMET AT PRIMARY CARE IN 1 MONTH   Testing/Procedures: NONE  Follow-Up: Your physician wants you to follow-up in: 6 MONTH OV You will receive a reminder letter in the mail two months in advance. If you don't receive a letter, please call our office to schedule the follow-up appointment.  If you need a refill on your cardiac medications before your next appointment, please call your pharmacy.

## 2016-04-08 ENCOUNTER — Encounter: Payer: Self-pay | Admitting: Cardiovascular Disease

## 2016-07-20 ENCOUNTER — Other Ambulatory Visit: Payer: Self-pay | Admitting: Cardiovascular Disease

## 2016-07-20 NOTE — Telephone Encounter (Signed)
Review for refill. 

## 2016-07-20 NOTE — Telephone Encounter (Signed)
Rx(s) sent to pharmacy electronically.  

## 2016-07-27 ENCOUNTER — Other Ambulatory Visit: Payer: Self-pay | Admitting: Cardiovascular Disease

## 2016-07-27 NOTE — Telephone Encounter (Signed)
Refill request

## 2016-07-27 NOTE — Telephone Encounter (Signed)
REFILL 

## 2016-08-02 ENCOUNTER — Encounter: Payer: Self-pay | Admitting: *Deleted

## 2016-08-02 ENCOUNTER — Ambulatory Visit (INDEPENDENT_AMBULATORY_CARE_PROVIDER_SITE_OTHER): Payer: BC Managed Care – PPO | Admitting: Cardiovascular Disease

## 2016-08-02 ENCOUNTER — Encounter: Payer: Self-pay | Admitting: Cardiovascular Disease

## 2016-08-02 ENCOUNTER — Encounter (INDEPENDENT_AMBULATORY_CARE_PROVIDER_SITE_OTHER): Payer: BC Managed Care – PPO

## 2016-08-02 VITALS — BP 120/70 | HR 86 | Ht 75.5 in | Wt 219.0 lb

## 2016-08-02 DIAGNOSIS — I10 Essential (primary) hypertension: Secondary | ICD-10-CM

## 2016-08-02 DIAGNOSIS — R0982 Postnasal drip: Secondary | ICD-10-CM

## 2016-08-02 DIAGNOSIS — E7800 Pure hypercholesterolemia, unspecified: Secondary | ICD-10-CM

## 2016-08-02 DIAGNOSIS — I4891 Unspecified atrial fibrillation: Secondary | ICD-10-CM | POA: Diagnosis not present

## 2016-08-02 HISTORY — DX: Postnasal drip: R09.82

## 2016-08-02 MED ORDER — FLUTICASONE PROPIONATE 50 MCG/ACT NA SUSP: 2.0000 | Freq: Every day | NASAL | 3 refills | Status: DC

## 2016-08-02 MED ORDER — ROSUVASTATIN CALCIUM 5 MG PO TABS: 5.0000 mg | ORAL_TABLET | Freq: Every day | ORAL | 3 refills | Status: DC

## 2016-08-02 NOTE — Patient Instructions (Addendum)
Medication Instructions:  FLONASE 2 SRAYS EACH NOSTRIL DAILY SENT TO PHARMACY   Labwork: NONE  Testing/Procedures: Your physician has recommended that you wear an event monitor. Event monitors are medical devices that record the heart's electrical activity. Doctors most often us these monitors to diagnose arrhythmias. Arrhythmias are problems with the speed or rhythm of the heartbeat. The monitor is a small, portable device. You can wear one while you do your normal daily activities. This is usually used to diagnose what is causing palpitations/syncope (passing out). 30 DAYS  Follow-Up: Your physician wants you to follow-up in: 1 YEAR OV You will receive a reminder letter in the mail two months in advance. If you don't receive a letter, please call our office to schedule the follow-up appointment.  If you need a refill on your cardiac medications before your next appointment, please call your pharmacy.

## 2016-08-02 NOTE — Progress Notes (Signed)
Cardiology Office Note   Date:  08/02/2016   ID:  Tom Baker, DOB [birth year], MRN [id number]  PCP:  Tom SlimmerLARK,PRESTON S, MD  Cardiologist:   Tom Siiffany [place], MD   Chief Complaint  Patient presents with  . Follow-up    NO chest pain, shortness of breath, edema, pain or cramping in legs, lightheaded or dizziness      History of Present Illness: Tom Baker is a 61 y.o. male with diabetes mellitus type 1, hypertension, and hyperlipidemia who presents for follow up.  Tom Baker was initially seen 11/2015 with lower extremity edema. [facility] for ischemia.  He was admitted to the hospital 6/9-6/13 with cough-variant syncope in the setting of pneumonia.  He was noted to have a moderate pericardial effusion and was started on colchicine and aspirin.  He denied chest pain at that time.  He was also noted to have atrial flutter and was treated with aspirin only due to concern of hemorrhagic conversion of his pericardial effusion.  Tom Baker followed up in clinic and his repeat echo showed resolution of the effusion. He was started on Xarelto at that time.  Tom Baker denies chest pain, shortness of breath, lower extremity edema, orthopnea or PND.  He also denies any recurrent palpitations since discharge.  [facility] was negative for ischemia.    Since his last appointment Tom Baker has been doing well.  He denies chest pain, shortness of breath, lower extremity edema, orthopnea or PND.  He denies lightheadedness or dizziness.  His only complaint is post-nasal drip for the last two weeks.  He notes a non-productive cough and denies fever or chills.   Likes to be called Tom Baker    Past Medical History:  Diagnosis Date  . Diabetes mellitus type 1 (HCC)   . Hyperlipidemia   . Hypertension   . Hypothyroid   . Post-nasal drip 08/02/2016    Past Surgical History:  Procedure Laterality Date  . ANKLE SURGERY    .  CHOLECYSTECTOMY    . KNEE SURGERY    . TOE SURGERY       Current Outpatient Prescriptions  Medication Sig Dispense Refill  . amLODipine (NORVASC) 5 MG tablet Take 1 tablet (5 mg total) by mouth daily. 30 tablet 0  . Cholecalciferol (VITAMIN D3) 2000 units TABS Take 1 tablet by mouth daily.    . Emollient (DERMEND BRUISE FORMULA) CREA Apply topically. Use as directed    . Insulin Infusion Pump DEVI by Does not apply route. Novolog Insulin    . Lancets (ONETOUCH ULTRASOFT) lancets TEST six times a day  0  . levothyroxine (SYNTHROID, LEVOTHROID) 125 MCG tablet Take 125 mcg by mouth daily.      Marland Kitchen. losartan (COZAAR) 100 MG tablet Take 100 mg by mouth daily.      . Multiple Vitamin (MULTIVITAMIN) tablet Take 1 tablet by mouth daily.    Marland Kitchen. NOVOLOG 100 UNIT/ML injection USE 50 UNITS IN INSULIN PUMP ONCE DAILY  0  . PRESCRIPTION MEDICATION Medication Olive oil : 30ml by mouth daily    . rivaroxaban (XARELTO) 20 MG TABS tablet Take 1 tablet (20 mg total) by mouth daily with supper. 90 tablet 3  . rosuvastatin (CRESTOR) 5 MG tablet Take 1 tablet (5 mg total) by mouth daily. 90 tablet 3  . fluticasone (FLONASE) 50 MCG/ACT nasal spray Place 2 sprays into both nostrils daily. 15.8 g 3  No current facility-administered medications for this visit.     Allergies:   Patient has no known allergies.    Social History:  The patient  reports that he has never smoked. He has never used smokeless tobacco. He reports that he does not drink alcohol or use drugs.   Family History:  The patient'Baker family history includes Bone cancer in his father.    ROS:  Please see the history of present illness.   Otherwise, review of systems are positive for none.   All other systems are reviewed and negative.    PHYSICAL EXAM: VS:  BP 120/70 (BP Location: Left Arm, Patient Position: Sitting, Cuff Size: Normal)   Pulse 86   Ht 6' 3.5" (1.918 m)   Wt 99.3 kg (219 lb)   BMI 27.01 kg/m , BMI Body mass index is 27.01  kg/m. GENERAL:  Well appearing HEENT:  Pupils equal round and reactive, fundi not visualized, oral mucosa unremarkable NECK:  No jugular venous distention, waveform within normal limits, carotid upstroke brisk and symmetric, no bruit  LYMPHATICS:  No cervical adenopathy LUNGS:  Clear to auscultation bilaterally HEART:  RRR.  PMI not displaced or sustained,S1 and S2 within normal limits, no S3, no S4, no clicks, no rubs, no murmurs ABD:  Flat, positive bowel sounds normal in frequency in pitch, no bruits, no rebound, no guarding, no midline pulsatile mass, no hepatomegaly, no splenomegaly EXT:  2 plus pulses throughout, no edema, no cyanosis no clubbing SKIN:  No rashes no nodules NEURO:  Cranial nerves II through XII grossly intact, motor grossly intact throughout PSYCH:  Cognitively intact, oriented to person place and time   EKG:  EKG is ordered today. The ekg ordered today demonstrates sinus rhythm rate 86 bpm.    Echo 12/13/15: Study Conclusions  - Left ventricle: The cavity size was normal. Systolic function was   normal. The estimated ejection fraction was in the range of 60%   to 65%. Wall motion was normal; there were no regional wall   motion abnormalities. - Atrial septum: No defect or patent foramen ovale was identified. - Pericardium, extracardiac: Moderate pericardial effusion mostly   posteriro and would be hard to reach percutaneously   IVC small with collapse on sniff. No evidence of tamponade.  Lexiscan Myoview 12/13/15:  The left ventricular ejection fraction is hyperdynamic (>65%).  Nuclear stress EF: 69%.  Persistent diffuse ST elevation consistent with early repolarization.Marland Kitchen No change from baseline EKG  The study is normal.  This is a low risk study.  Recent Labs: 12/13/2015: BUN 8; Creatinine, Ser 1.19; Potassium 4.0; Sodium 137 12/14/2015: TSH 2.288 12/16/2015: Hemoglobin 12.0; Platelets 438    Lipid Panel No results found for: CHOL, TRIG, HDL,  CHOLHDL, VLDL, LDLCALC, LDLDIRECT    Wt Readings from Last 3 Encounters:  08/02/16 99.3 kg (219 lb)  01/29/16 93.4 kg (206 lb)  01/02/16 92.9 kg (204 lb 12.8 oz)      ASSESSMENT AND PLAN:  # CV Risk: Continue rosuvastatin.  Repeat lipids at follow up.  # Paroxysmal atrial flutter: It seems that has been in sinus rhythm since his hospitalization with pneumonia and pericardial effusion.  Mr. Grewe  is currently in sinus rhythm and feels as though he has been since hospitalization. It is likely that this occurred in the setting of his pneumonia and pericardial effusion. We will check a 30 day event monitor.  If free from atrial fibrillation, OK to stop Xarelto.  # Pericardial effusion: Resolved 6/30.  Likely viral.  It is not clear that he had pericarditis.  No chest pain after stopping colchicine.   # Hypertension: BP well-controlled.  Continue amlodipine and losartan.  # Post-nasal drip: Start fluticasone nasal spray.  Current medicines are reviewed at length with the patient today.  The patient does not have concerns regarding medicines.  The following changes have been made:  no change  Labs/ tests ordered today include:   Orders Placed This Encounter  Procedures  . Cardiac event monitor  . EKG 12-Lead    Time spent: 25 minutes-Greater than 50% of this time was spent in counseling, explanation of diagnosis, planning of further management, and coordination of care.   Disposition:   FU with Alyanna Stoermer C. Duke Salvia, MD, [facility] in 1 year    This note was written with the assistance of speech recognition software.  Please excuse any transcriptional errors.  Signed, Grasiela Jonsson C. Duke Salvia, MD, [facility]  08/02/2016 4:52 PM    [facility]

## 2016-08-17 ENCOUNTER — Telehealth: Payer: Self-pay | Admitting: Cardiovascular Disease

## 2016-08-17 NOTE — Telephone Encounter (Signed)
Spoke with patient and he just wanted for Dr Duke Salviaandolph to know that he had a stomach bug last week starting 08/21/16 with high fever for several days.  He is wearing an event monitor and wanted it noted  Will forward to Dr Duke Salviaandolph for review

## 2016-08-17 NOTE — Telephone Encounter (Signed)
Pt is wearing a monitor and he have had a virus.Please call,he have some questions.

## 2016-08-31 DIAGNOSIS — I4891 Unspecified atrial fibrillation: Secondary | ICD-10-CM

## 2016-10-01 ENCOUNTER — Telehealth: Payer: Self-pay | Admitting: *Deleted

## 2016-10-01 NOTE — Telephone Encounter (Signed)
Notes recorded by Alyson Ingles, LPN on 1/61/0960 at 4:34 PM EDT Pt notified

## 2016-10-01 NOTE — Telephone Encounter (Signed)
-----   Message from Chilton Si, MD sent at 09/20/2016  3:50 PM EDT ----- Occasional early heart beats from the bottom chamber of the heart.  These are not dangerous.  No atrial fibrillation or atrial flutter.  OK to stop Xarelto.  Start aspirin 81 mg daily.

## 2017-03-22 IMAGING — NM NM MISC PROCEDURE
6 series · 36 of 36 positions shown · non-contrast
Comparison: none

[Series 1: wbr rest · 6.40mm/px · 6 of 64 frames shown]
[frame 6/64]
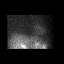
[frame 16/64]
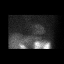
[frame 27/64]
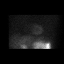
[frame 38/64]
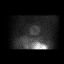
[frame 48/64]
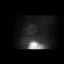
[frame 59/64]
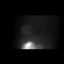

[Series 1: wbr_r-proj_st wbr rest · 6.40mm/px · 6 of 64 frames shown]
[frame 6/64]
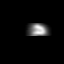
[frame 16/64]
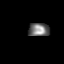
[frame 27/64]
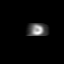
[frame 38/64]
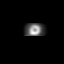
[frame 48/64]
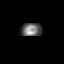
[frame 59/64]
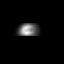

[Series 2: wbr stress-gsp · 6.40mm/px · 6 of 488 frames shown]
[frame 41/488  full-range]
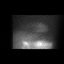
[frame 122/488  full-range]
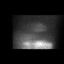
[frame 204/488  full-range]
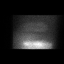
[frame 285/488  full-range]
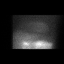
[frame 366/488  full-range]
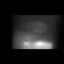
[frame 448/488  full-range]
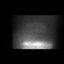

[Series 2: wbr_s-proj_st wbr stress-gsp · 6.40mm/px · 6 of 512 frames shown]
[frame 43/512]
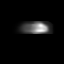
[frame 128/512]
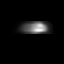
[frame 214/512]
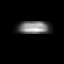
[frame 299/512]
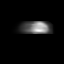
[frame 384/512]
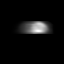
[frame 470/512]
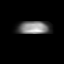

[Series 3: wbr stress-sum-em · 6.40mm/px · 6 of 64 frames shown]
[frame 6/64]
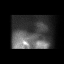
[frame 16/64]
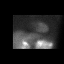
[frame 27/64]
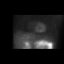
[frame 38/64]
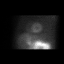
[frame 48/64]
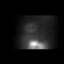
[frame 59/64]
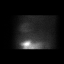

[Series 3: wbr_s-proj_st wbr stress-sum-em · 6.40mm/px · 6 of 64 frames shown]
[frame 6/64]
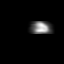
[frame 16/64]
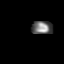
[frame 27/64]
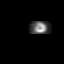
[frame 38/64]
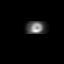
[frame 48/64]
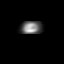
[frame 59/64]
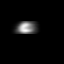

[36 of 36 positions shown; findings below may reference images not displayed]

Canned report from images found in remote index.

Refer to host system for actual result text.

## 2017-03-27 IMAGING — CR DG CHEST 2V
2 series · 2 of 2 positions shown · non-contrast
Comparison: None.

CLINICAL DATA: Cough x1 week, fever, dizziness

EXAM:
CHEST  2 VIEW

[chest pa]
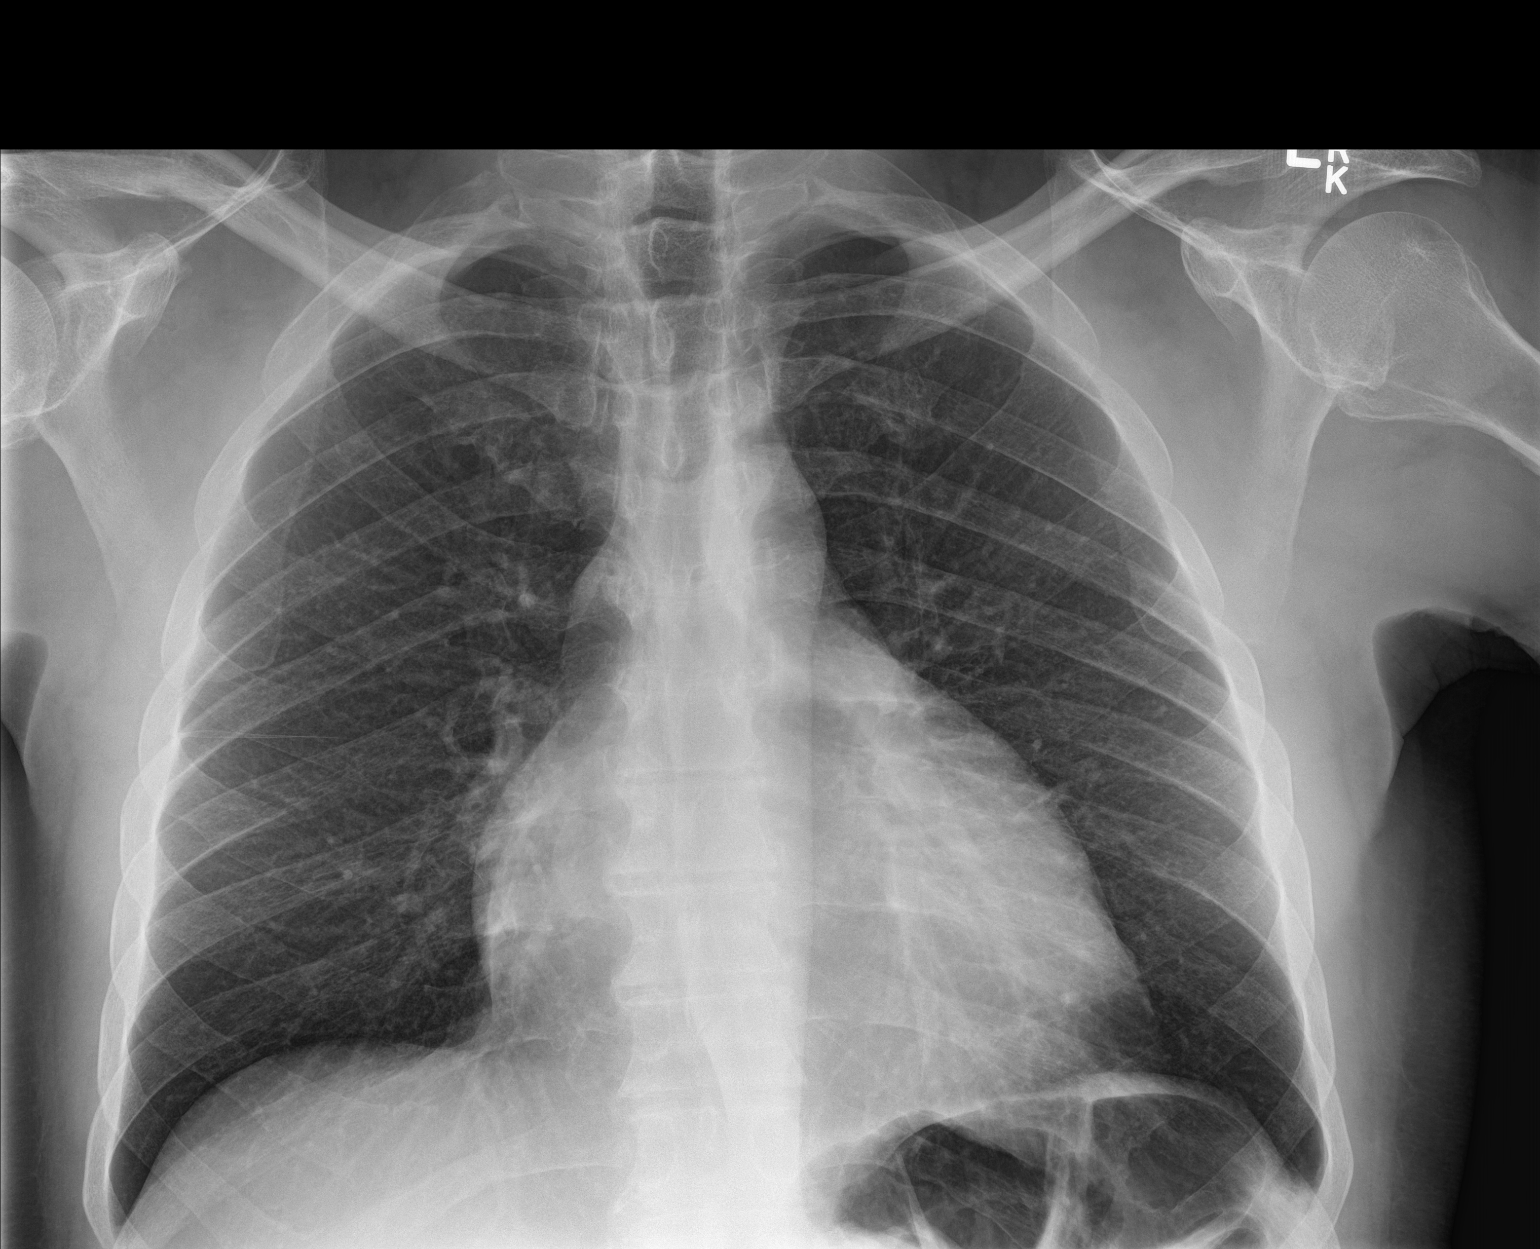

[chest lat]
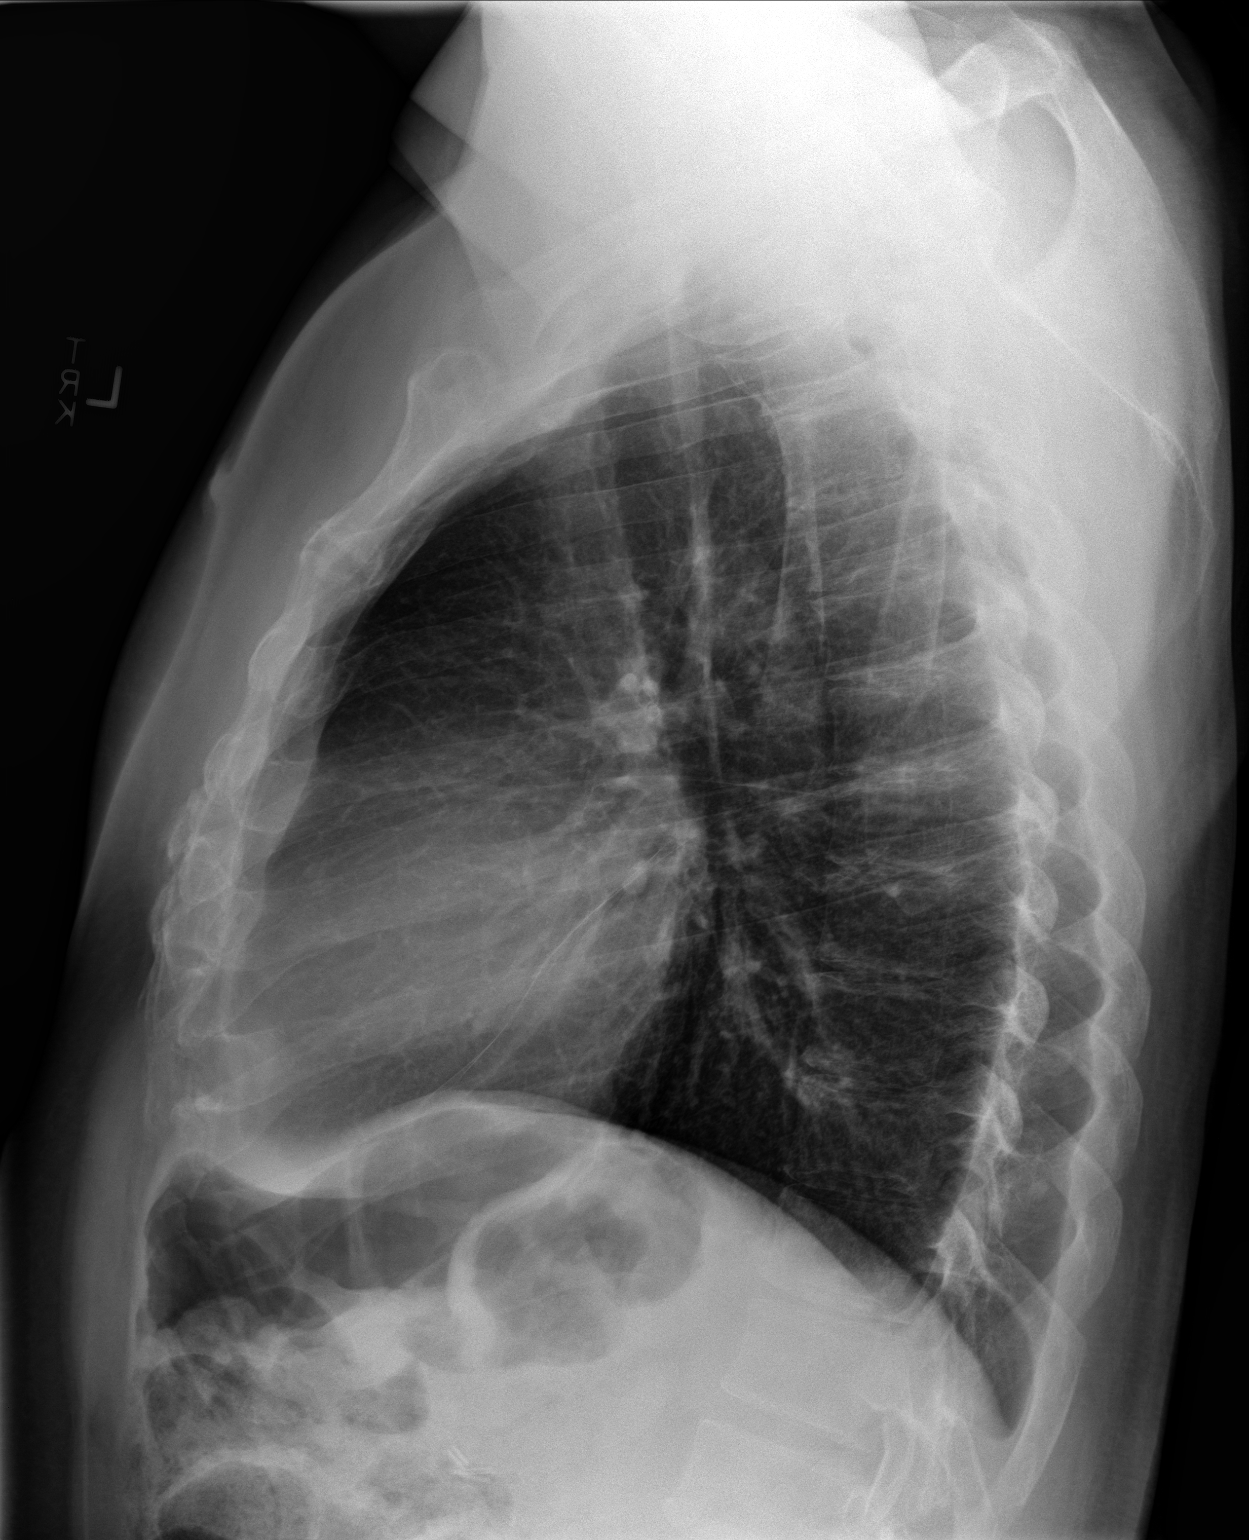

[2 of 2 positions shown; findings below may reference images not displayed]

FINDINGS: Lungs are clear.  No pleural effusion or pneumothorax.

Cardiomegaly.

Visualized osseous structures are within normal limits.
IMPRESSION: No evidence of acute cardiopulmonary disease.

## 2017-07-01 ENCOUNTER — Ambulatory Visit (INDEPENDENT_AMBULATORY_CARE_PROVIDER_SITE_OTHER): Payer: BC Managed Care – PPO | Admitting: Cardiovascular Disease

## 2017-07-01 ENCOUNTER — Encounter: Payer: Self-pay | Admitting: Cardiovascular Disease

## 2017-07-01 VITALS — BP 132/62 | HR 90 | Ht 75.0 in | Wt 204.8 lb

## 2017-07-01 DIAGNOSIS — E108 Type 1 diabetes mellitus with unspecified complications: Secondary | ICD-10-CM | POA: Diagnosis not present

## 2017-07-01 DIAGNOSIS — E7800 Pure hypercholesterolemia, unspecified: Secondary | ICD-10-CM | POA: Diagnosis not present

## 2017-07-01 DIAGNOSIS — I4892 Unspecified atrial flutter: Secondary | ICD-10-CM

## 2017-07-01 MED ORDER — ROSUVASTATIN CALCIUM 5 MG PO TABS: 5.0000 mg | ORAL_TABLET | Freq: Every day | ORAL | 3 refills | Status: DC

## 2017-07-01 NOTE — Patient Instructions (Signed)
MEDICATION CHANGES  REFILL FOR ROSUVASTATIN      LABS- DO NOT EAT OR DRINK THE MORNING OF THE TEST HGBA1c Lipid CMP  SEE ACCOMPANYING PAPER FOR INSTRUCTIONS    Your physician wants you to follow-up in 12 MONTHS WITH DR [place]ston. You will receive a reminder letter in the mail two months in advance. If you don't receive a letter, please call our office to schedule the follow-up appointment.   If you need a refill on your cardiac medications before your next appointment, please call your pharmacy.

## 2017-07-01 NOTE — Progress Notes (Signed)
Cardiology Office Note   Date:  07/01/2017   ID:  Tom Baker, DOB 1955/11/19, MRN [id number]  PCP:  Laurena Slimmerlark, Preston S, MD  Cardiologist:   Chilton Siiffany [place], MD   No chief complaint on file.     History of Present Illness: Tom Baker is a 61 y.o. male with diabetes mellitus type 1, hypertension, and hyperlipidemia who presents for follow up.  Tom Baker was initially seen 11/2015 with lower extremity edema. [facility] that was negative for ischemia.  He was admitted to the hospital 6/9-6/13 with cough-variant syncope in the setting of pneumonia.  He was noted to have a moderate pericardial effusion and was started on colchicine and aspirin.  He denied chest pain at that time.  He was also noted to have atrial flutter and was treated with aspirin only due to concern of hemorrhagic conversion of his pericardial effusion.  Tom Baker followed up in clinic and his repeat echo showed resolution of the effusion. He was started on Xarelto at that time.  Tom Baker denies chest pain, shortness of breath, lower extremity edema, orthopnea or PND.  He also denies any recurrent palpitations since discharge.  [facility] that was negative for ischemia.    Since his last appointment Tom Baker has been doing well.  He doesn't get much formal exercise but is very active at work.  He denies any chest pain or shortness of breath.  He has not noted any lower extremity edema, orthopnea, or PND.  He has not had any palpitations, lightheadedness, or dizziness.  He since his last appointment he had a 30-day event monitor that did not reveal any recurrent atrial fibrillation.  Xarelto was stopped and he was started on aspirin.  He is disappointed that his endocrinologist will be retiring next month.  He is trying to find a new endocrinologist.  His blood glucose levels have been well-controlled.  He rarely checks his blood pressure at home but when he does it  has been well-controlled.  Likes to be called Tom Baker  Past Medical History:  Diagnosis Date  . Diabetes mellitus type 1 (HCC)   . Hyperlipidemia   . Hypertension   . Hypothyroid   . Post-nasal drip 08/02/2016    Past Surgical History:  Procedure Laterality Date  . ANKLE SURGERY    . CHOLECYSTECTOMY    . KNEE SURGERY    . TOE SURGERY       Current Outpatient Medications  Medication Sig Dispense Refill  . amLODipine (NORVASC) 5 MG tablet Take 1 tablet (5 mg total) by mouth daily. 30 tablet 0  . aspirin EC 81 MG tablet Take 81 mg by mouth daily.    . Cholecalciferol (VITAMIN D3) 2000 units TABS Take 1 tablet by mouth daily.    . Emollient (DERMEND BRUISE FORMULA) CREA Apply topically. Use as directed    . fluticasone (FLONASE) 50 MCG/ACT nasal spray Place 2 sprays into both nostrils daily. 15.8 g 3  . Insulin Infusion Pump DEVI by Does not apply route. Novolog Insulin    . Lancets (ONETOUCH ULTRASOFT) lancets TEST six times a day  0  . levothyroxine (SYNTHROID, LEVOTHROID) 125 MCG tablet Take 125 mcg by mouth daily.      Marland Kitchen. losartan (COZAAR) 100 MG tablet Take 100 mg by mouth daily.      . Multiple Vitamin (MULTIVITAMIN) tablet Take 1 tablet by mouth daily.    Marland Kitchen. NOVOLOG  100 UNIT/ML injection USE 50 UNITS IN INSULIN PUMP ONCE DAILY  0  . PRESCRIPTION MEDICATION Medication Olive oil : 30ml by mouth daily    . rosuvastatin (CRESTOR) 5 MG tablet Take 1 tablet (5 mg total) by mouth daily. 90 tablet 3  . tadalafil (CIALIS) 5 MG tablet Take 5 mg by mouth daily.     No current facility-administered medications for this visit.     Allergies:   Patient has no known allergies.    Social History:  The patient  reports that  has never smoked. he has never used smokeless tobacco. He reports that he does not drink alcohol or use drugs.   Family History:  The patient's family history includes Bone cancer in his father.    ROS:  Please see the history of present illness.   Otherwise,  review of systems are positive for none.   All other systems are reviewed and negative.    PHYSICAL EXAM: VS:  BP 132/62   Pulse 90   Ht 6\' 3"  (1.905 m)   Wt 204 lb 12.8 oz (92.9 kg)   BMI 25.60 kg/m  , BMI Body mass index is 25.6 kg/m. GENERAL:  Well appearing HEENT: Pupils equal round and reactive, fundi not visualized, oral mucosa unremarkable NECK:  No jugular venous distention, waveform within normal limits, carotid upstroke brisk and symmetric, no bruits, no thyromegaly LUNGS:  Clear to auscultation bilaterally HEART:  RRR.  PMI not displaced or sustained,S1 and S2 within normal limits, no S3, no S4, no clicks, no rubs, no murmurs ABD:  Flat, positive bowel sounds normal in frequency in pitch, no bruits, no rebound, no guarding, no midline pulsatile mass, no hepatomegaly, no splenomegaly EXT:  2 plus pulses throughout, no edema, no cyanosis no clubbing.  Thenar/hypothenar atrophy.  SKIN:  No rashes no nodules NEURO:  Cranial nerves II through XII grossly intact, motor grossly intact throughout PSYCH:  Cognitively intact, oriented to person place and time   EKG:  EKG is ordered today. The ekg ordered today demonstrates sinus rhythm rate 86 bpm.   07/01/17: Sinus rhythm.  Rate 90 bpm.    Echo 12/13/15: Study Conclusions  - Left ventricle: The cavity size was normal. Systolic function was   normal. The estimated ejection fraction was in the range of 60%   to 65%. Wall motion was normal; there were no regional wall   motion abnormalities. - Atrial septum: No defect or patent foramen ovale was identified. - Pericardium, extracardiac: Moderate pericardial effusion mostly   posteriro and would be hard to reach percutaneously   IVC small with collapse on sniff. No evidence of tamponade.  Lexiscan Myoview 12/13/15:  The left ventricular ejection fraction is hyperdynamic (>65%).  Nuclear stress EF: 69%.  Persistent diffuse ST elevation consistent with early repolarization.Marland Kitchen. No  change from baseline EKG  The study is normal.  This is a low risk study.  Recent Labs: No results found for requested labs within last 8760 hours.    Lipid Panel No results found for: CHOL, TRIG, HDL, CHOLHDL, VLDL, LDLCALC, LDLDIRECT    Wt Readings from Last 3 Encounters:  07/01/17 204 lb 12.8 oz (92.9 kg)  08/02/16 219 lb (99.3 kg)  01/29/16 206 lb (93.4 kg)      ASSESSMENT AND PLAN:  # CV Risk: Continue rosuvastatin.  Repeat lipids and CMP.   # Paroxysmal atrial flutter: No recurrent episodes since his pneumonia.  Xarelto was switched to aspirin after 30 day event montior  was negative for afib.  # Pericardial effusion: Resolved 01/02/2016.  Likely viral.  It is not clear that he had pericarditis.  No chest pain after stopping colchicine.   # Hypertension: Stable.  Continue amlodipine and losartan.   Current medicines are reviewed at length with the patient today.  The patient does not have concerns regarding medicines.  The following changes have been made:  no change  Labs/ tests ordered today include:   Orders Placed This Encounter  Procedures  . Lipid panel  . Comprehensive metabolic panel  . Hemoglobin A1c  . EKG 12-Lead    Disposition:   FU with Lane Kjos C. Duke Salvia, MD, [facility] in 1 year    This note was written with the assistance of speech recognition software.  Please excuse any transcriptional errors.  Signed, Thy Gullikson C. Duke Salvia, MD, [facility]  07/01/2017 4:43 PM    [facility]

## 2017-07-05 LAB — COMPREHENSIVE METABOLIC PANEL
A/G RATIO: 2.1 (ref 1.2–2.2)
ALBUMIN: 4.8 g/dL (ref 3.6–4.8)
ALT: 17 IU/L (ref 0–44)
AST: 20 IU/L (ref 0–40)
Alkaline Phosphatase: 59 IU/L (ref 39–117)
BILIRUBIN TOTAL: 1.4 mg/dL — AB (ref 0.0–1.2)
BUN / CREAT RATIO: 14 (ref 10–24)
BUN: 20 mg/dL (ref 8–27)
CHLORIDE: 102 mmol/L (ref 96–106)
CO2: 23 mmol/L (ref 20–29)
Calcium: 9.5 mg/dL (ref 8.6–10.2)
Creatinine, Ser: 1.45 mg/dL — ABNORMAL HIGH (ref 0.76–1.27)
GFR calc non Af Amer: 51 mL/min/{1.73_m2} — ABNORMAL LOW (ref >59)
GFR, EST AFRICAN AMERICAN: 59 mL/min/{1.73_m2} — AB (ref >59)
Globulin, Total: 2.3 g/dL (ref 1.5–4.5)
Glucose: 163 mg/dL — ABNORMAL HIGH (ref 65–99)
Potassium: 4.9 mmol/L (ref 3.5–5.2)
Sodium: 139 mmol/L (ref 134–144)
Total Protein: 7.1 g/dL (ref 6.0–8.5)

## 2017-07-05 LAB — LIPID PANEL
CHOL/HDL RATIO: 2.7 ratio (ref 0.0–5.0)
Cholesterol, Total: 145 mg/dL (ref 100–199)
HDL: 53 mg/dL (ref >39)
LDL Calculated: 82 mg/dL (ref 0–99)
TRIGLYCERIDES: 51 mg/dL (ref 0–149)
VLDL CHOLESTEROL CAL: 10 mg/dL (ref 5–40)

## 2017-07-05 LAB — HEMOGLOBIN A1C
Est. average glucose Bld gHb Est-mCnc: 123 mg/dL
Hgb A1c MFr Bld: 5.9 % — ABNORMAL HIGH (ref 4.8–5.6)

## 2017-07-20 ENCOUNTER — Telehealth: Payer: Self-pay | Admitting: Cardiovascular Disease

## 2017-07-20 NOTE — Telephone Encounter (Signed)
Advised patient of lab results, sent to Dr Talmage NapBalan who patient says is going to be new PCP

## 2017-07-20 NOTE — Telephone Encounter (Signed)
-----   Message from Chilton Siiffany [place], MD sent at 07/18/2017  9:46 AM EST ----- Cholesterol levels look very good.  His kidney function appears to be slightly worse than his baseline.  Recommend following up with his PCP about this. Hemoglobin A1c level is 5.9%, which is very good.

## 2017-07-20 NOTE — Telephone Encounter (Signed)
New message ° °Pt verbalized that he is returning call for RN °

## 2017-07-25 ENCOUNTER — Telehealth: Payer: Self-pay | Admitting: *Deleted

## 2017-07-25 DIAGNOSIS — N289 Disorder of kidney and ureter, unspecified: Secondary | ICD-10-CM

## 2017-07-25 NOTE — Telephone Encounter (Signed)
-----   Message from Chilton Siiffany [place], MD sent at 07/21/2017 11:09 PM EST ----- Please repeat BMP.  If still elevated we will refer him to nephrology.

## 2017-07-25 NOTE — Telephone Encounter (Signed)
Advised patient will recheck labs soon.   Notes recorded by Chilton Siandolph, Tiffany, MD on 07/21/2017 at 11:09 PM EST Please repeat BMP. If still elevated we will refer him to nephrology. ------  Notes recorded by Regis BillPratt, Melinda B on 07/20/2017 at 3:00 PM EST Advised patient of lab results. Patient stated he is currently in between PCP's secondary to his retiring. Has had all of his records transferred to Dr Talmage NapBalan and sent labs to her. Advised to call and follow up on an appointment. Patient wanted for me to make sure Dr Duke Salviaandolph had no further recommendations regarding kidney functions at this time. ------  Notes recorded by Regis BillPratt, Melinda B on 07/19/2017 at 7:14 PM EST Left message to call back ------  Notes recorded by Chilton Siandolph, Tiffany, MD on 07/18/2017 at 9:46 AM EST Cholesterol levels look very good. His kidney function appears to be slightly worse than his baseline. Recommend following up with his PCP about this. Hemoglobin A1c level is 5.9%, which is very good.

## 2017-07-26 LAB — BASIC METABOLIC PANEL
BUN / CREAT RATIO: 17 (ref 10–24)
BUN: 20 mg/dL (ref 8–27)
CO2: 24 mmol/L (ref 20–29)
CREATININE: 1.19 mg/dL (ref 0.76–1.27)
Calcium: 9.8 mg/dL (ref 8.6–10.2)
Chloride: 100 mmol/L (ref 96–106)
GFR calc Af Amer: 75 mL/min/{1.73_m2} (ref >59)
GFR, EST NON AFRICAN AMERICAN: 65 mL/min/{1.73_m2} (ref >59)
Glucose: 104 mg/dL — ABNORMAL HIGH (ref 65–99)
Potassium: 4.5 mmol/L (ref 3.5–5.2)
SODIUM: 140 mmol/L (ref 134–144)

## 2017-10-09 ENCOUNTER — Other Ambulatory Visit: Payer: Self-pay | Admitting: Cardiovascular Disease

## 2017-10-10 NOTE — Telephone Encounter (Signed)
Please review for refill. Thanks!  

## 2018-01-04 ENCOUNTER — Other Ambulatory Visit: Payer: Self-pay | Admitting: Cardiovascular Disease

## 2018-01-10 ENCOUNTER — Other Ambulatory Visit: Payer: Self-pay | Admitting: Cardiovascular Disease

## 2018-01-10 NOTE — Telephone Encounter (Signed)
Rx sent to pharmacy   

## 2018-03-13 ENCOUNTER — Other Ambulatory Visit: Payer: Self-pay

## 2018-03-13 MED ORDER — ROSUVASTATIN CALCIUM 5 MG PO TABS: 5.0000 mg | ORAL_TABLET | Freq: Every day | ORAL | 0 refills | Status: DC

## 2018-03-13 NOTE — Telephone Encounter (Signed)
Rx(s) sent to pharmacy electronically.  

## 2018-04-11 ENCOUNTER — Other Ambulatory Visit: Payer: Self-pay | Admitting: Cardiovascular Disease

## 2018-04-25 ENCOUNTER — Other Ambulatory Visit: Payer: Self-pay | Admitting: Cardiovascular Disease

## 2018-04-27 NOTE — Telephone Encounter (Signed)
Ok to refill 

## 2018-05-12 ENCOUNTER — Other Ambulatory Visit: Payer: Self-pay | Admitting: Cardiovascular Disease

## 2018-06-26 ENCOUNTER — Other Ambulatory Visit: Payer: Self-pay | Admitting: Cardiovascular Disease

## 2018-06-26 MED ORDER — FLUTICASONE PROPIONATE 50 MCG/ACT NA SUSP: NASAL | 0 refills | Status: DC

## 2018-06-26 NOTE — Telephone Encounter (Signed)
Approve this one but future should come from PCP

## 2018-08-08 ENCOUNTER — Other Ambulatory Visit: Payer: Self-pay | Admitting: Cardiovascular Disease

## 2018-09-08 ENCOUNTER — Other Ambulatory Visit: Payer: Self-pay | Admitting: Cardiovascular Disease

## 2018-10-06 ENCOUNTER — Other Ambulatory Visit: Payer: Self-pay | Admitting: Cardiovascular Disease

## 2018-11-02 ENCOUNTER — Other Ambulatory Visit: Payer: Self-pay | Admitting: Cardiovascular Disease

## 2018-11-02 NOTE — Telephone Encounter (Signed)
Crestor and flonase refilled.

## 2018-11-23 ENCOUNTER — Telehealth: Payer: Self-pay | Admitting: Cardiovascular Disease

## 2018-11-23 NOTE — Telephone Encounter (Signed)
Smartphone/ consent/ my chart via email/ pre reg completed °

## 2018-11-27 NOTE — Progress Notes (Signed)
Virtual Visit via Video Note   This visit type was conducted due to national recommendations for restrictions regarding the COVID-19 Pandemic (e.g. social distancing) in an effort to limit this patient's exposure and mitigate transmission in our community.  Due to his co-morbid illnesses, this patient is at least at moderate risk for complications without adequate follow up.  This format is felt to be most appropriate for this patient at this time.  All issues noted in this document were discussed and addressed.  A limited physical exam was performed with this format.  Please refer to the patient's chart for his consent to telehealth for [facility].   Date:  11/27/2018   ID:  Tom Baker, DOB 10/01/1955, MRN [id number]  Patient Location: Home Provider Location: Home  PCP:  Tom Reichmann, DO  Cardiologist:  No primary care provider on file.  Electrophysiologist:  None   Evaluation Performed:  Follow-Up Visit  Chief Complaint:  Annual follow up  History of Present Illness:    Tom Baker is a 64 y.o. male with diabetes mellitus type 1, hypertension, and hyperlipidemia who presents for follow up.  Tom Baker was initially seen 11/2015 with lower extremity edema. He was referred for [place] [facility] on 12/2015 that was negative for ischemia.  He was admitted to the hospital 6/9-6/13 with cough-variant syncope in the setting of pneumonia.  He was noted to have a moderate pericardial effusion and was started on colchicine and aspirin.  He denied chest pain at that time.  He was also noted to have atrial flutter and was treated with aspirin only due to concern of hemorrhagic conversion of his pericardial effusion.  Tom Baker followed up in clinic and his repeat echo showed resolution of the effusion. He was started on Xarelto at that time.  Tom Baker denies chest pain, shortness of breath, lower extremity edema, orthopnea or PND.  He also denies any recurrent palpitations since discharge.  He was  referred for [facility] 12/05/15 that was negative for ischemia.    Since his last appointment Tom Baker has been doing well.  He got a new insulin pump and had difficulty regulating it at first.  Lately his blood sugars have been better controlled.  He is also changed his diet and has lost 13 pounds since I last saw him.  He does not get much exercise.  He occasionally walks for 10 or 15 minutes and has no exertional chest pain or shortness of breath with this activity.  He denies any lower extremity edema, orthopnea, or PND.  His blood pressures have been well-controlled.  He does not check them very regularly because they are stable.  He has a new PCP and will be a physical in June or July.  He has not experienced any palpitations, lightheadedness or dizziness and does not think he has had any recurrent atrial arrhythmias.   The patient does not have symptoms concerning for COVID-19 infection (fever, chills, cough, or new shortness of breath).    Past Medical History:  Diagnosis Date   Diabetes mellitus type 1 (HCC)    Hyperlipidemia    Hypertension    Hypothyroid    Post-nasal drip 08/02/2016   Past Surgical History:  Procedure Laterality Date   ANKLE SURGERY     CHOLECYSTECTOMY     KNEE SURGERY     TOE SURGERY       No outpatient medications have been marked as taking for the 11/28/18 encounter (Appointment) with Chilton Si, MD.  Allergies:   Patient has no known allergies.   Social History   Tobacco Use   Smoking status: Never Smoker   Smokeless tobacco: Never Used  Substance Use Topics   Alcohol use: No    Alcohol/week: 0.0 standard drinks    Comment: last used 1992   Drug use: No     Family Hx: The patient's family history includes Bone cancer in his father.  ROS:   Please see the history of present illness.     All other systems reviewed and are negative.   Prior CV studies:   The following studies were reviewed today:  Echo  12/13/15: Study Conclusions  - Left ventricle: The cavity size was normal. Systolic function was normal. The estimated ejection fraction was in the range of 60% to 65%. Wall motion was normal; there were no regional wall motion abnormalities. - Atrial septum: No defect or patent foramen ovale was identified. - Pericardium, extracardiac: Moderate pericardial effusion mostly posteriro and would be hard to reach percutaneously IVC small with collapse on sniff. No evidence of tamponade.  Lexiscan Myoview 12/13/15:  The left ventricular ejection fraction is hyperdynamic (>65%).  Nuclear stress EF: 69%.  Persistent diffuse ST elevation consistent with early repolarization.Marland Kitchen No change from baseline EKG  The study is normal.  This is a low risk study.  Labs/Other Tests and Data Reviewed:    EKG:  No ECG reviewed.  Recent Labs: No results found for requested labs within last 8760 hours.   01/06/2018: Potassium 4.6, BUN 17, creatinine 1.16 total cholesterol 139, triglycerides 97, HDL 50, LDL 70 TSH 0.656  Recent Lipid Panel Lab Results  Component Value Date/Time   CHOL 145 07/04/2017 09:07 AM   TRIG 51 07/04/2017 09:07 AM   HDL 53 07/04/2017 09:07 AM   CHOLHDL 2.7 07/04/2017 09:07 AM   LDLCALC 82 07/04/2017 09:07 AM    Wt Readings from Last 3 Encounters:  07/01/17 204 lb 12.8 oz (92.9 kg)  08/02/16 219 lb (99.3 kg)  01/29/16 206 lb (93.4 kg)     Objective:    BP (!) 123/53    Pulse 83    Ht  (1.905 m)    Wt 191 lb (86.6 kg)    BMI 23.87 kg/m  GENERAL: Well-appearing.  No acute distress. HEENT: Pupils equal round.  Oral mucosa unremarkable NECK:  No jugular venous distention, no visible thyromegaly EXT:  No edema, no cyanosis no clubbing SKIN:  No rashes no nodules NEURO:  Speech fluent.  Cranial nerves grossly intact.  Moves all 4 extremities freely PSYCH:  Cognitively intact, oriented to person place and time   ASSESSMENT & PLAN:    # CV Risk:  Continue rosuvastatin.  He will have lipids checked with his PCP this summer.  I have asked him to send a copy.  LDL was 70 on 01/2018.  # Paroxysmal atrial flutter: No recurrent episodes since his pneumonia.  Xarelto was switched to aspirin after 30 day event montior was negative for afib.  # Pericardial effusion: Resolved 01/02/2016.  Likely viral.  It is not clear that he had pericarditis.  No chest pain after stopping colchicine.   # Hypertension: BP well-controlled.  Continue amlodipine and losartan.  COVID-19 Education: The signs and symptoms of COVID-19 were discussed with the patient and how to seek care for testing (follow up with PCP or arrange E-visit).  The importance of social distancing was discussed today.  Time:   Today, I have spent 16 minutes  with the patient with telehealth technology discussing the above problems.     Medication Adjustments/Labs and Tests Ordered: Current medicines are reviewed at length with the patient today.  Concerns regarding medicines are outlined above.   Tests Ordered: No orders of the defined types were placed in this encounter.   Medication Changes: No orders of the defined types were placed in this encounter.   Disposition:  Follow up in 1 year(s)  Signed, Chilton Siiffany [place], MD  11/27/2018 10:14 AM    [facility]

## 2018-11-28 ENCOUNTER — Encounter: Payer: Self-pay | Admitting: Cardiovascular Disease

## 2018-11-28 ENCOUNTER — Telehealth (INDEPENDENT_AMBULATORY_CARE_PROVIDER_SITE_OTHER): Payer: BC Managed Care – PPO | Admitting: Cardiovascular Disease

## 2018-11-28 ENCOUNTER — Encounter

## 2018-11-28 DIAGNOSIS — I10 Essential (primary) hypertension: Secondary | ICD-10-CM

## 2018-11-28 DIAGNOSIS — I483 Typical atrial flutter: Secondary | ICD-10-CM

## 2018-11-28 DIAGNOSIS — I313 Pericardial effusion (noninflammatory): Secondary | ICD-10-CM | POA: Diagnosis not present

## 2018-11-28 DIAGNOSIS — I4892 Unspecified atrial flutter: Secondary | ICD-10-CM

## 2018-11-28 NOTE — Patient Instructions (Addendum)
Medication Instructions:  Your physician recommends that you continue on your current medications as directed. Please refer to the Current Medication list given to you today.  If you need a refill on your cardiac medications before your next appointment, please call your pharmacy.   Lab work: NONE  Testing/Procedures: NONE  Follow-Up: At [facility], you and your health needs are our priority.  As part of our continuing mission to provide you with exceptional heart care, we have created designated Provider Care Teams.  These Care Teams include your primary Cardiologist (physician) and Advanced Practice Providers (APPs -  Physician Assistants and Nurse Practitioners) who all work together to provide you with the care you need, when you need it. You will need a follow up appointment in 12 months.  Please call our office 2 months in advance to schedule this appointment.  You may see Lonny Eisen [place], MD or one of the following Advanced Practice Providers on your designated Care Team:   Luke Kilroy, PA-C Krista Kroeger, PA-C . Callie Goodrich, PA-C    

## 2018-12-02 ENCOUNTER — Other Ambulatory Visit: Payer: Self-pay | Admitting: Cardiovascular Disease

## 2018-12-22 ENCOUNTER — Other Ambulatory Visit: Payer: Self-pay | Admitting: Cardiovascular Disease

## 2019-01-24 ENCOUNTER — Other Ambulatory Visit: Payer: Self-pay | Admitting: Cardiovascular Disease

## 2019-01-24 NOTE — Telephone Encounter (Signed)
Flonase scrpit should ideally come from his PCP. Happy to refill for 1 month if needed, after which PCP for refills and continued script.

## 2019-02-21 ENCOUNTER — Other Ambulatory Visit: Payer: Self-pay | Admitting: Internal Medicine

## 2019-03-02 ENCOUNTER — Other Ambulatory Visit: Payer: Self-pay

## 2019-03-02 MED ORDER — FLUTICASONE PROPIONATE 50 MCG/ACT NA SUSP: NASAL | 0 refills | Status: DC

## 2019-03-30 ENCOUNTER — Other Ambulatory Visit: Payer: Self-pay | Admitting: Internal Medicine

## 2019-04-13 ENCOUNTER — Other Ambulatory Visit: Payer: Self-pay

## 2019-11-02 ENCOUNTER — Ambulatory Visit: Payer: BC Managed Care – PPO | Admitting: Cardiovascular Disease

## 2019-11-02 ENCOUNTER — Other Ambulatory Visit: Payer: Self-pay

## 2019-11-02 ENCOUNTER — Encounter: Payer: Self-pay | Admitting: Cardiovascular Disease

## 2019-11-02 VITALS — BP 110/62 | HR 82 | Temp 97.1°F | Ht 75.0 in | Wt 189.0 lb

## 2019-11-02 DIAGNOSIS — I10 Essential (primary) hypertension: Secondary | ICD-10-CM

## 2019-11-02 DIAGNOSIS — E7800 Pure hypercholesterolemia, unspecified: Secondary | ICD-10-CM

## 2019-11-02 DIAGNOSIS — R55 Syncope and collapse: Secondary | ICD-10-CM | POA: Diagnosis not present

## 2019-11-02 DIAGNOSIS — I483 Typical atrial flutter: Secondary | ICD-10-CM

## 2019-11-02 NOTE — Patient Instructions (Signed)
Medication Instructions:  Your physician recommends that you continue on your current medications as directed. Please refer to the Current Medication list given to you today.  Lab Work: NONE   Testing/Procedures: NONE   Follow-Up: At [facility], you and your health needs are our priority.  As part of our continuing mission to provide you with exceptional heart care, we have created designated Provider Care Teams.  These Care Teams include your primary Cardiologist (physician) and Advanced Practice Providers (APPs -  Physician Assistants and Nurse Practitioners) who all work together to provide you with the care you need, when you need it.  We recommend signing up for the patient portal called "MyChart".  Sign up information is provided on this After Visit Summary.  MyChart is used to connect with patients for Virtual Visits (Telemedicine).  Patients are able to view lab/test results, encounter notes, upcoming appointments, etc.  Non-urgent messages can be sent to your provider as well.   To learn more about what you can do with MyChart, go to [email or web address].    Your next appointment:   12 month(s) You will receive a reminder letter in the mail two months in advance. If you don't receive a letter, please call our office to schedule the follow-up appointment. *If you need a refill on your cardiac medications before your next appointment, please call your pharmacy*   The format for your next appointment:   In Person  Provider:   You may see Chilton Si, MD or one of the following Advanced Practice Providers on your designated Care Team:    Corine Shelter, PA-C  [place], [place]  Edd Fabian, [place]

## 2019-11-02 NOTE — Progress Notes (Signed)
Cardiology Office Note   Date:  11/05/2019   ID:  Tom Baker, DOB Oct 26, 1955, MRN [id number]  PCP:  Irena Reichmann, DO  Cardiologist:  Chilton Si, MD  Electrophysiologist:  None   Evaluation Performed:  Follow-Up Visit  Chief Complaint:  Annual follow up  History of Present Illness:    Tom Baker is a 64 y.o. male with diabetes mellitus type 1, hypertension, and hyperlipidemia who presents for follow up.  Tom Baker was initially seen 11/2015 with lower extremity edema. He was referred for [facility] on 12/2015 that was negative for ischemia.  He was admitted to the hospital 6/9-6/13 with cough-variant syncope in the setting of pneumonia.  He was noted to have a moderate pericardial effusion and was started on colchicine and aspirin.  He denied chest pain at that time.  He was also noted to have atrial flutter and was treated with aspirin only due to concern of hemorrhagic conversion of his pericardial effusion.  Tom Baker followed up in clinic and his repeat echo showed resolution of the effusion. He was started on Xarelto at that time.  Tom Baker denies chest pain, shortness of breath, lower extremity edema, orthopnea or PND.  He also denies any recurrent palpitations since discharge.  He was referred for [facility] 12/05/15 that was negative for ischemia.    Tom Baker has been doing well.  He has not had any recurrent episodes of atrial fibrillation/flutter.  He exercises by walking about twice per week.  He has no exertional chest pain or shortness of breath.  His main problem is balance due to peripheral neuropathy.  He had diabetes for over 50 years.  He struggles with his balance but denies any falls.  He has mild edema but no orthopnea or PND.  He denies any chest pain or pressure when he walks.  His breathing has been stable.   Past Medical History:  Diagnosis Date  . Diabetes mellitus type 1 (HCC)   . Hyperlipidemia   . Hypertension   . Hypothyroid   . Post-nasal drip  08/02/2016   Past Surgical History:  Procedure Laterality Date  . ANKLE SURGERY    . CHOLECYSTECTOMY    . KNEE SURGERY    . TOE SURGERY       Current Meds  Medication Sig  . amLODipine (NORVASC) 5 MG tablet Take 1 tablet (5 mg total) by mouth daily.  Marland Kitchen aspirin EC 81 MG tablet Take 81 mg by mouth daily.  . fluticasone (FLONASE) 50 MCG/ACT nasal spray SHAKE LIQUID AND USE 2 SPRAYS IN EACH NOSTRIL EVERY DAY  . Insulin Infusion Pump DEVI by Does not apply route. Novolog Insulin  . Lancets (ONETOUCH ULTRASOFT) lancets TEST six times a day  . levothyroxine (SYNTHROID, LEVOTHROID) 125 MCG tablet Take 125 mcg by mouth daily.    Marland Kitchen losartan (COZAAR) 100 MG tablet Take 100 mg by mouth daily.    Marland Kitchen NOVOLOG 100 UNIT/ML injection USE 50 UNITS IN INSULIN PUMP ONCE DAILY  . Polyethylene Glycol 3350 (MIRALAX PO) Take 1 tablet by mouth daily.  Marland Kitchen PRESCRIPTION MEDICATION Medication Olive oil : 41ml by mouth daily  . rosuvastatin (CRESTOR) 5 MG tablet TAKE 1 TABLET(5 MG) BY MOUTH DAILY AT 6 PM  . tadalafil (CIALIS) 5 MG tablet Take 5 mg by mouth daily.  . [DISCONTINUED] Cholecalciferol (VITAMIN D3) 2000 units TABS Take 1 tablet by mouth daily.  . [DISCONTINUED] Multiple Vitamin (MULTIVITAMIN) tablet Take 1 tablet by mouth daily.  Allergies:   Patient has no known allergies.   Social History   Tobacco Use  . Smoking status: Never Smoker  . Smokeless tobacco: Never Used  Substance Use Topics  . Alcohol use: No    Alcohol/week: 0.0 standard drinks    Comment: last used 1992  . Drug use: No     Family Hx: The patient's family history includes Bone cancer in his father.  ROS:   Please see the history of present illness.     All other systems reviewed and are negative.   Prior CV studies:   The following studies were reviewed today:  Echo 12/13/15: Study Conclusions  - Left ventricle: The cavity size was normal. Systolic function was normal. The estimated ejection fraction was in  the range of 60% to 65%. Wall motion was normal; there were no regional wall motion abnormalities. - Atrial septum: No defect or patent foramen ovale was identified. - Pericardium, extracardiac: Moderate pericardial effusion mostly posteriro and would be hard to reach percutaneously IVC small with collapse on sniff. No evidence of tamponade.  Lexiscan Myoview 12/13/15:  The left ventricular ejection fraction is hyperdynamic (>65%).  Nuclear stress EF: 69%.  Persistent diffuse ST elevation consistent with early repolarization.Marland Kitchen No change from baseline EKG  The study is normal.  This is a low risk study.  Labs/Other Tests and Data Reviewed:    EKG:  An ECG dated 11/02/2019 was personally reviewed today and demonstrated:  Sinus rhythm.  Rate 82 bpm.  PVCs.  Recent Labs: No results found for requested labs within last 8760 hours.   01/06/2018: Potassium 4.6, BUN 17, creatinine 1.16 total cholesterol 139, triglycerides 97, HDL 50, LDL 70 TSH 0.656  Recent Lipid Panel Lab Results  Component Value Date/Time   CHOL 145 07/04/2017 09:07 AM   TRIG 51 07/04/2017 09:07 AM   HDL 53 07/04/2017 09:07 AM   CHOLHDL 2.7 07/04/2017 09:07 AM   LDLCALC 82 07/04/2017 09:07 AM    Wt Readings from Last 3 Encounters:  11/02/19 189 lb (85.7 kg)  11/28/18 191 lb (86.6 kg)  07/01/17 204 lb 12.8 oz (92.9 kg)     Objective:    VS:  BP 110/62 (BP Location: Left Arm, Patient Position: Sitting, Cuff Size: Normal)   Pulse 82   Temp (!) 97.1 F (36.2 C)   Ht 6\' 3"  (1.905 m)   Wt 189 lb (85.7 kg)   BMI 23.62 kg/m  , BMI Body mass index is 23.62 kg/m. GENERAL:  Well appearing HEENT: Pupils equal round and reactive, fundi not visualized, oral mucosa unremarkable NECK:  No jugular venous distention, waveform within normal limits, carotid upstroke brisk and symmetric, no bruits LUNGS:  Clear to auscultation bilaterally HEART:  RRR.  PMI not displaced or sustained,S1 and S2 within normal  limits, no S3, no S4, no clicks, no rubs, no murmurs ABD:  Flat, positive bowel sounds normal in frequency in pitch, no bruits, no rebound, no guarding, no midline pulsatile mass, no hepatomegaly, no splenomegaly EXT:  2 plus pulses throughout, trace edema, no cyanosis no clubbing SKIN:  No rashes no nodules NEURO:  Cranial nerves II through XII grossly intact, motor grossly intact throughout PSYCH:  Cognitively intact, oriented to person place and time    ASSESSMENT & PLAN:    # CV Risk: Continue rosuvastatin.  He is due for lipids this summer.  LDL goal <70.  # Paroxysmal atrial flutter: No recurrent episodes since his pneumonia.  Xarelto was switched to  aspirin after 30 day event montior was negative for afib.  # Pericardial effusion: Resolved 01/02/2016.  Likely viral.  It is not clear that he had pericarditis.  No chest pain after stopping colchicine.   # Hypertension: BP remains well-controlled.  Continue amlodipine and losartan.     Medication Adjustments/Labs and Tests Ordered: Current medicines are reviewed at length with the patient today.  Concerns regarding medicines are outlined above.   Tests Ordered: No orders of the defined types were placed in this encounter.   Medication Changes: No orders of the defined types were placed in this encounter.   Disposition:  Follow up in 1 year(s)  Signed, Chilton Si, MD  11/05/2019 4:36 PM    [facility]

## 2019-11-15 ENCOUNTER — Other Ambulatory Visit: Payer: Self-pay | Admitting: Cardiovascular Disease

## 2020-06-06 ENCOUNTER — Other Ambulatory Visit: Payer: Self-pay | Admitting: Urology

## 2020-06-20 ENCOUNTER — Other Ambulatory Visit (HOSPITAL_COMMUNITY): Payer: BC Managed Care – PPO

## 2020-09-10 ENCOUNTER — Other Ambulatory Visit: Payer: Self-pay

## 2020-09-10 ENCOUNTER — Encounter (HOSPITAL_BASED_OUTPATIENT_CLINIC_OR_DEPARTMENT_OTHER): Payer: Self-pay | Admitting: Urology

## 2020-09-10 NOTE — Progress Notes (Addendum)
Spoke w/ via phone for pre-op interview---pt Lab needs dos---- I stat               Lab results------see below COVID test -----09-13-2020 1000 am- Arrive at -------530 am 09-16-2020 NPO after MN NO Solid Food.  Clear liquids from MN until---430 am then npo Med rec completed Medications to take morning of surgery ----amlodipine, levothyroxine, cialis, eye drop- Diabetic medication ----turn insulin pump to basal rate at 430 am day of surgery per endocrinology instructions Patient instructed to bring photo id and insurance card day of surgery Patient aware to have Driver (ride ) / caregiverwife joyce will stay  for 24 hours after surgery  Patient Special Instructions -----bring insulin pump supplies day of surgery Pre-Op special Istructions -----none Patient verbalized understanding of instructions that were given at this phone interview. Patient denies shortness of breath, chest pain, fever, cough at this phone interview.  lov cardiology dr Duke Salvia 11-02-2019 epic Echo 12-13-2015 epic ekg 11-02-2019 epic Celine Ahr 12-13-2015 epic

## 2020-09-13 ENCOUNTER — Other Ambulatory Visit (HOSPITAL_COMMUNITY)
Admission: RE | Admit: 2020-09-13 | Discharge: 2020-09-13 | Disposition: A | Discharge status: Home / Self Care | Payer: BC Managed Care – PPO | Source: Ambulatory Visit | Attending: Urology | Admitting: Urology

## 2020-09-13 DIAGNOSIS — Z79890 Hormone replacement therapy (postmenopausal): Secondary | ICD-10-CM | POA: Diagnosis not present

## 2020-09-13 DIAGNOSIS — N401 Enlarged prostate with lower urinary tract symptoms: Secondary | ICD-10-CM | POA: Diagnosis not present

## 2020-09-13 DIAGNOSIS — Z794 Long term (current) use of insulin: Secondary | ICD-10-CM | POA: Diagnosis not present

## 2020-09-13 DIAGNOSIS — Z20822 Contact with and (suspected) exposure to covid-19: Secondary | ICD-10-CM | POA: Insufficient documentation

## 2020-09-13 DIAGNOSIS — Z9641 Presence of insulin pump (external) (internal): Secondary | ICD-10-CM | POA: Diagnosis not present

## 2020-09-13 DIAGNOSIS — Z01812 Encounter for preprocedural laboratory examination: Secondary | ICD-10-CM | POA: Insufficient documentation

## 2020-09-13 DIAGNOSIS — Z7982 Long term (current) use of aspirin: Secondary | ICD-10-CM | POA: Diagnosis not present

## 2020-09-13 DIAGNOSIS — R3912 Poor urinary stream: Secondary | ICD-10-CM | POA: Diagnosis not present

## 2020-09-13 DIAGNOSIS — Z79899 Other long term (current) drug therapy: Secondary | ICD-10-CM | POA: Diagnosis not present

## 2020-09-13 DIAGNOSIS — Z808 Family history of malignant neoplasm of other organs or systems: Secondary | ICD-10-CM | POA: Diagnosis not present

## 2020-09-13 LAB — SARS CORONAVIRUS 2 (TAT 6-24 HRS): SARS Coronavirus 2: NEGATIVE

## 2020-09-15 ENCOUNTER — Encounter (HOSPITAL_BASED_OUTPATIENT_CLINIC_OR_DEPARTMENT_OTHER): Payer: Self-pay | Admitting: Urology

## 2020-09-15 NOTE — Progress Notes (Signed)
Pt called to review pre op instructions, pt vocalized understanding all pre op instructions

## 2020-09-16 ENCOUNTER — Ambulatory Visit (HOSPITAL_BASED_OUTPATIENT_CLINIC_OR_DEPARTMENT_OTHER): Payer: BC Managed Care – PPO | Admitting: Anesthesiology

## 2020-09-16 ENCOUNTER — Encounter (HOSPITAL_BASED_OUTPATIENT_CLINIC_OR_DEPARTMENT_OTHER)
Admission: RE | Disposition: A | Discharge status: Home / Self Care | Payer: Self-pay | Source: Home / Self Care | Attending: Urology

## 2020-09-16 ENCOUNTER — Ambulatory Visit (HOSPITAL_BASED_OUTPATIENT_CLINIC_OR_DEPARTMENT_OTHER)
Admission: RE | Admit: 2020-09-16 | Discharge: 2020-09-16 | Disposition: A | Discharge status: Home / Self Care | Payer: BC Managed Care – PPO | Attending: Urology | Admitting: Urology

## 2020-09-16 ENCOUNTER — Encounter (HOSPITAL_BASED_OUTPATIENT_CLINIC_OR_DEPARTMENT_OTHER): Payer: Self-pay | Admitting: Urology

## 2020-09-16 ENCOUNTER — Other Ambulatory Visit: Payer: Self-pay

## 2020-09-16 DIAGNOSIS — Z79899 Other long term (current) drug therapy: Secondary | ICD-10-CM | POA: Insufficient documentation

## 2020-09-16 DIAGNOSIS — Z7982 Long term (current) use of aspirin: Secondary | ICD-10-CM | POA: Insufficient documentation

## 2020-09-16 DIAGNOSIS — Z79890 Hormone replacement therapy (postmenopausal): Secondary | ICD-10-CM | POA: Insufficient documentation

## 2020-09-16 DIAGNOSIS — Z794 Long term (current) use of insulin: Secondary | ICD-10-CM | POA: Insufficient documentation

## 2020-09-16 DIAGNOSIS — N401 Enlarged prostate with lower urinary tract symptoms: Secondary | ICD-10-CM | POA: Insufficient documentation

## 2020-09-16 DIAGNOSIS — N138 Other obstructive and reflux uropathy: Secondary | ICD-10-CM

## 2020-09-16 DIAGNOSIS — Z20822 Contact with and (suspected) exposure to covid-19: Secondary | ICD-10-CM | POA: Insufficient documentation

## 2020-09-16 DIAGNOSIS — Z9641 Presence of insulin pump (external) (internal): Secondary | ICD-10-CM | POA: Insufficient documentation

## 2020-09-16 DIAGNOSIS — Z808 Family history of malignant neoplasm of other organs or systems: Secondary | ICD-10-CM | POA: Insufficient documentation

## 2020-09-16 DIAGNOSIS — R3912 Poor urinary stream: Secondary | ICD-10-CM | POA: Insufficient documentation

## 2020-09-16 HISTORY — DX: Presence of spectacles and contact lenses: Z97.3

## 2020-09-16 HISTORY — DX: Unspecified glaucoma: H40.9

## 2020-09-16 HISTORY — DX: Other complications of anesthesia, initial encounter: T88.59XA

## 2020-09-16 HISTORY — DX: Other specified symptoms and signs involving the circulatory and respiratory systems: R09.89

## 2020-09-16 HISTORY — DX: Benign prostatic hyperplasia without lower urinary tract symptoms: N40.0

## 2020-09-16 HISTORY — DX: Gastro-esophageal reflux disease without esophagitis: K21.9

## 2020-09-16 HISTORY — PX: CYSTOSCOPY WITH INSERTION OF UROLIFT: SHX6678

## 2020-09-16 LAB — POCT I-STAT, CHEM 8
BUN: 36 mg/dL — ABNORMAL HIGH (ref 8–23)
Calcium, Ion: 1.26 mmol/L (ref 1.15–1.40)
Chloride: 105 mmol/L (ref 98–111)
Creatinine, Ser: 1.7 mg/dL — ABNORMAL HIGH (ref 0.61–1.24)
Glucose, Bld: 150 mg/dL — ABNORMAL HIGH (ref 70–99)
HCT: 41 % (ref 39.0–52.0)
Hemoglobin: 13.9 g/dL (ref 13.0–17.0)
Potassium: 4.5 mmol/L (ref 3.5–5.1)
Sodium: 142 mmol/L (ref 135–145)
TCO2: 26 mmol/L (ref 22–32)

## 2020-09-16 SURGERY — CYSTOSCOPY WITH INSERTION OF UROLIFT
Anesthesia: General | Site: Prostate

## 2020-09-16 MED ORDER — ONDANSETRON HCL 4 MG/2ML IJ SOLN
INTRAMUSCULAR | Status: AC
  Filled 2020-09-16: qty 2

## 2020-09-16 MED ORDER — ACETAMINOPHEN 325 MG PO TABS: 325.0000 mg | ORAL_TABLET | Freq: Once | ORAL | Status: DC | PRN

## 2020-09-16 MED ORDER — DEXAMETHASONE SODIUM PHOSPHATE 10 MG/ML IJ SOLN
INTRAMUSCULAR | Status: AC
  Filled 2020-09-16: qty 1

## 2020-09-16 MED ORDER — OXYCODONE HCL 5 MG PO TABS
5.0000 mg | ORAL_TABLET | Freq: Once | ORAL | Status: DC | PRN
Start: 2020-09-16 — End: 2020-09-16

## 2020-09-16 MED ORDER — FENTANYL CITRATE (PF) 100 MCG/2ML IJ SOLN
INTRAMUSCULAR | Status: AC
  Filled 2020-09-16: qty 2

## 2020-09-16 MED ORDER — PROPOFOL 10 MG/ML IV BOLUS
INTRAVENOUS | Status: DC | PRN
  Administered 2020-09-16 (×2): 50 mg via INTRAVENOUS
  Administered 2020-09-16: 200 mg via INTRAVENOUS

## 2020-09-16 MED ORDER — OXYCODONE HCL 5 MG/5ML PO SOLN: 5.0000 mg | Freq: Once | ORAL | Status: DC | PRN

## 2020-09-16 MED ORDER — EPHEDRINE 5 MG/ML INJ
INTRAVENOUS | Status: AC
  Filled 2020-09-16: qty 10

## 2020-09-16 MED ORDER — CEFAZOLIN SODIUM-DEXTROSE 2-4 GM/100ML-% IV SOLN
2.0000 g | Freq: Once | INTRAVENOUS | Status: AC
  Administered 2020-09-16: 2 g via INTRAVENOUS

## 2020-09-16 MED ORDER — STERILE WATER FOR IRRIGATION IR SOLN
Status: DC | PRN
  Administered 2020-09-16: 4000 mL

## 2020-09-16 MED ORDER — PHENYLEPHRINE HCL (PRESSORS) 10 MG/ML IV SOLN
INTRAVENOUS | Status: DC | PRN
  Administered 2020-09-16: 80 ug via INTRAVENOUS
  Administered 2020-09-16 (×3): 120 ug via INTRAVENOUS
  Administered 2020-09-16: 80 ug via INTRAVENOUS
  Administered 2020-09-16: 120 ug via INTRAVENOUS

## 2020-09-16 MED ORDER — LIDOCAINE 2% (20 MG/ML) 5 ML SYRINGE
INTRAMUSCULAR | Status: AC
  Filled 2020-09-16: qty 5

## 2020-09-16 MED ORDER — CEFAZOLIN SODIUM-DEXTROSE 2-4 GM/100ML-% IV SOLN
INTRAVENOUS | Status: AC
  Filled 2020-09-16: qty 100

## 2020-09-16 MED ORDER — FENTANYL CITRATE (PF) 100 MCG/2ML IJ SOLN: 25.0000 ug | INTRAMUSCULAR | Status: DC | PRN

## 2020-09-16 MED ORDER — MIDAZOLAM HCL 2 MG/2ML IJ SOLN
INTRAMUSCULAR | Status: AC
  Filled 2020-09-16: qty 2

## 2020-09-16 MED ORDER — LACTATED RINGERS IV SOLN: INTRAVENOUS | Status: DC

## 2020-09-16 MED ORDER — ACETAMINOPHEN 160 MG/5ML PO SOLN: 325.0000 mg | Freq: Once | ORAL | Status: DC | PRN

## 2020-09-16 MED ORDER — FENTANYL CITRATE (PF) 100 MCG/2ML IJ SOLN
INTRAMUSCULAR | Status: DC | PRN
  Administered 2020-09-16: 50 ug via INTRAVENOUS
  Administered 2020-09-16: 25 ug via INTRAVENOUS

## 2020-09-16 MED ORDER — EPHEDRINE SULFATE 50 MG/ML IJ SOLN
INTRAMUSCULAR | Status: DC | PRN
  Administered 2020-09-16 (×6): 5 mg via INTRAVENOUS

## 2020-09-16 MED ORDER — ACETAMINOPHEN 10 MG/ML IV SOLN: 1000.0000 mg | Freq: Once | INTRAVENOUS | Status: DC | PRN

## 2020-09-16 MED ORDER — MEPERIDINE HCL 25 MG/ML IJ SOLN: 6.2500 mg | INTRAMUSCULAR | Status: DC | PRN

## 2020-09-16 MED ORDER — PROPOFOL 10 MG/ML IV BOLUS
INTRAVENOUS | Status: AC
  Filled 2020-09-16: qty 40

## 2020-09-16 MED ORDER — PHENYLEPHRINE 40 MCG/ML (10ML) SYRINGE FOR IV PUSH (FOR BLOOD PRESSURE SUPPORT)
PREFILLED_SYRINGE | INTRAVENOUS | Status: AC
  Filled 2020-09-16: qty 20

## 2020-09-16 MED ORDER — ONDANSETRON HCL 4 MG/2ML IJ SOLN
INTRAMUSCULAR | Status: DC | PRN
  Administered 2020-09-16: 4 mg via INTRAVENOUS

## 2020-09-16 MED ORDER — LIDOCAINE HCL (CARDIAC) PF 100 MG/5ML IV SOSY
PREFILLED_SYRINGE | INTRAVENOUS | Status: DC | PRN
  Administered 2020-09-16: 50 mg via INTRAVENOUS

## 2020-09-16 MED ORDER — AMISULPRIDE (ANTIEMETIC) 5 MG/2ML IV SOLN: 10.0000 mg | Freq: Once | INTRAVENOUS | Status: DC | PRN

## 2020-09-16 SURGICAL SUPPLY — 25 items
BAG DRAIN URO-CYSTO SKYTR STRL (DRAIN) ×2 IMPLANT
BAG DRN RND TRDRP ANRFLXCHMBR (UROLOGICAL SUPPLIES)
BAG DRN UROCATH (DRAIN) ×1
BAG URINE DRAIN 2000ML AR STRL (UROLOGICAL SUPPLIES) IMPLANT
BAG URINE LEG 500ML (DRAIN) IMPLANT
CATH COUDE FOLEY 2W 5CC 18FR (CATHETERS) IMPLANT
CATH FOLEY 2WAY SLVR  5CC 16FR (CATHETERS)
CATH FOLEY 2WAY SLVR  5CC 18FR (CATHETERS)
CATH FOLEY 2WAY SLVR 5CC 16FR (CATHETERS) IMPLANT
CATH FOLEY 2WAY SLVR 5CC 18FR (CATHETERS) IMPLANT
CLOTH BEACON ORANGE TIMEOUT ST (SAFETY) ×2 IMPLANT
ELECT REM PT RETURN 9FT ADLT (ELECTROSURGICAL)
ELECTRODE REM PT RTRN 9FT ADLT (ELECTROSURGICAL) ×1 IMPLANT
GLOVE SURG ENC MOIS LTX SZ7.5 (GLOVE) ×2 IMPLANT
GLOVE SURG ENC MOIS LTX SZ8 (GLOVE) IMPLANT
GOWN STRL REUS W/TWL LRG LVL3 (GOWN DISPOSABLE) ×2 IMPLANT
KIT TURNOVER CYSTO (KITS) ×2 IMPLANT
MANIFOLD NEPTUNE II (INSTRUMENTS) ×2 IMPLANT
NEEDLE HYPO 22GX1.5 SAFETY (NEEDLE) IMPLANT
NS IRRIG 500ML POUR BTL (IV SOLUTION) IMPLANT
PACK CYSTO (CUSTOM PROCEDURE TRAY) ×2 IMPLANT
SYSTEM UROLIFT (Male Continence) ×2 IMPLANT
TUBE CONNECTING 12X1/4 (SUCTIONS) ×1 IMPLANT
WATER STERILE IRR 3000ML UROMA (IV SOLUTION) ×3 IMPLANT
WATER STERILE IRR 500ML POUR (IV SOLUTION) ×1 IMPLANT

## 2020-09-16 NOTE — Transfer of Care (Signed)
Immediate Anesthesia Transfer of Care Note  Patient: Tom Baker  Procedure(s) Performed: CYSTOSCOPY WITH INSERTION OF UROLIFT; URETHRAL DILATION (N/A Prostate)  Patient Location: PACU  Anesthesia Type:General  Level of Consciousness: awake, alert , oriented and patient cooperative  Airway & Oxygen Therapy: Patient Spontanous Breathing and Patient connected to nasal cannula oxygen  Post-op Assessment: Report given to RN and Post -op Vital signs reviewed and stable  Post vital signs: Reviewed and stable  Last Vitals:  Vitals Value Taken Time  BP 128/80 09/16/20 0830  Temp 36.4 C 09/16/20 0830  Pulse 98 09/16/20 0832  Resp 15 09/16/20 0832  SpO2 100 % 09/16/20 0832  Vitals shown include unvalidated device data.  Last Pain:  Vitals:   09/16/20 0621  TempSrc: Oral  PainSc: 0-No pain      Patients Stated Pain Goal: 5 (09/16/20 5852)  Complications: No complications documented.

## 2020-09-16 NOTE — Op Note (Signed)
Preoperative diagnosis: BPH with obstructive symptomatology. Postoperative diagnosis: Same  Principal procedure: Urolift procedure, with the placement of 2 implants.  Surgeon: Mena Goes Assistant: Margette Fast, MD  Anesthesia: Gen  Complications: None  Drains: None   Estimated blood loss: Less than 25 mL  Indications: 65 -year-old male with obstructive symptomatology secondary to BPH. The patient's symptoms have progressed, and he has requested further management. His LUTS improved with alpha blockers but he had bothersome side effects.   Findings: Using the 17 French cystoscope, urethra and bladder were inspected. There were no urethral lesions. Prostatic urethra was obstructed secondary to bilobar hypertrophy. The bladder was inspected circumferentially. This revealed normal findings. No stone or foregin body. No mucosal lesions. Mild trabeculation. Trigone and UOs normal with clear efflux.   On EUA, the penis was circumcised and without mass or lesion. There was mild meatal stenosis but otherwise the meatus appeared normal. The scrotum was normal. The testicle were descended and palpably normal. On DRE, the prostate was smooth and without hard area or nodule. Prostate about 30 g.   Description of procedure: After consent was obtained he was taken to the OR. After adequate anesthesia he was placed in lithotomy position. He was prepped and draped in usual sterile fashion. A timeout was performed to confirm the patient and procedure. An EUA was performed. A 51F cystoscope was inserted into the bladder. The cystoscopy bridge was replaced with a UroLift delivery device.The first treatment site was the patient's left side approximately 1.5cm distal to the bladder neck which was also lined up nicely with the target of just proximal to the veru. The distal tip of the delivery device was then angled laterally approximately 20 degrees at this position to compress the lateral lobe. The trigger was  pulled, thereby deploying a needle containing the implant through the prostate. The needle was then retracted, allowing one end of the implant to be delivered to the capsular surface of the prostate. The implant was then tensioned to assure capsular seating and removal of slack monofilament. The device was then angled back toward midline and slowly advanced proximally until cystoscopic verification of the monofilament being centered in the delivery bay. The urethral end piece was then affixed to the monofilament thereby tailoring the size of the implant. Excess filament was then severed. The delivery device was then re-advanced into the bladder. The delivery device was then replaced with cystoscope and bridge and the implant location and opening effect was confirmed cystoscopically. The same procedure was then repeated on the right side but the second device did not throw a suture. Therefore a third device was used on the right side to place the second implant (#2). He did not require further implants. A final cystoscopy was conducted first to inspect the location and state of each implant and second, to confirm the presence of a continuous anterior channel was present through the prostatic urethra with irrigation flow turned off. Hemostasis was excellent. Two Implants were delivered in total.  Following this, the scope was removed and he was then awakened and taken to the recovery area in stable condition. He tolerated the procedure well.

## 2020-09-16 NOTE — Discharge Instructions (Signed)
Prostatic Urethral Lift, Care After This sheet gives you information about how to care for yourself after your procedure. Your health care provider may also give you more specific instructions. If you have problems or questions, contact your health care provider. What can I expect after the procedure? After the procedure, it is common to have:  Discomfort or burning when urinating.  An increased urge to urinate.  More frequent urination.  Urine that is slightly blood-tinged. These symptoms should go away after a few days. Follow these instructions at home:  Take over-the-counter and prescription medicines only as told by your health care provider.  Do not drive for 24 hours if you were given a medicine to help you relax (sedative).  Do not drive or use heavy machinery while taking prescription pain medicine.  Do not lift anything that is heavier than 10 lb (4.5 kg) until your health care provider says that this is safe.  Return to your normal activities as told by your health care provider. Ask your health care provider what activities are safe for you. Ask when you can return to sexual activity.  Drink enough fluid to keep your urine clear or pale yellow.  Keep all follow-up visits as told by your health care provider. This is important.   Contact a health care provider if:  You have chills or a fever.  You have pain when passing urine.  You have bright red blood or blood clots in your urine.  You have difficulty passing urine.  You have leaking of urine (incontinence). Get help right away if:  You have chest pain or shortness of breath.  You have leg pain or swelling.  You cannot pass urine. Summary  After the procedure, it is common to have discomfort or burning when urinating, an increased urge to urinate, more frequent urination, and urine that is slightly blood-tinged.  Do not drive for 24 hours if you were given a medicine to help you relax (sedative). Do not  drive or use heavy machinery while taking prescription pain medicine.  Do not lift anything that is heavier than 10 lb (4.5 kg) until your health care provider says that this is safe.  Return to your normal activities as told by your health care provider. This information is not intended to replace advice given to you by your health care provider. Make sure you discuss any questions you have with your health care provider. Document Revised: 02/28/2020 Document Reviewed: 02/28/2020 Elsevier Patient Education  2021 Elsevier Inc.    Post Anesthesia Home Care Instructions  Activity: Get plenty of rest for the remainder of the day. A responsible individual must stay with you for 24 hours following the procedure.  For the next 24 hours, DO NOT: -Drive a car -Advertising copywriter -Drink alcoholic beverages -Take any medication unless instructed by your physician -Make any legal decisions or sign important papers.  Meals: Start with liquid foods such as gelatin or soup. Progress to regular foods as tolerated. Avoid greasy, spicy, heavy foods. If nausea and/or vomiting occur, drink only clear liquids until the nausea and/or vomiting subsides. Call your physician if vomiting continues.  Special Instructions/Symptoms: Your throat may feel dry or sore from the anesthesia or the breathing tube placed in your throat during surgery. If this causes discomfort, gargle with warm salt water. The discomfort should disappear within 24 hours.  If you had a scopolamine patch placed behind your ear for the management of post- operative nausea and/or vomiting:  1. The  medication in the patch is effective for 72 hours, after which it should be removed.  Wrap patch in a tissue and discard in the trash. Wash hands thoroughly with soap and water. 2. You may remove the patch earlier than 72 hours if you experience unpleasant side effects which may include dry mouth, dizziness or visual disturbances. 3. Avoid  touching the patch. Wash your hands with soap and water after contact with the patch.

## 2020-09-16 NOTE — H&P (Signed)
H&P  Chief Complaint: BPH, weak stream  History of Present Illness: Tom Baker is a 65 year old male with a history of BPH and weak stream.  He has tried alpha blockers on and off over the years which helped his symptoms but cause dizziness.  He restarted tamsulosin again January 2022 and desires to be off medication.  His prostate was 19 g on ultrasound.  He had obstructing lateral lobes on cystoscopy.  His urinalysis in the office was clear.  He presents today for UroLift.  He has been well.  No dysuria, gross hematuria or fever.  Past Medical History:  Diagnosis Date  . BPH (benign prostatic hyperplasia)   . Complication of anesthesia    hard to urinate after surgery ankle surgery 2005 [facility]  . Diabetes mellitus type 1 (HCC)   . GERD (gastroesophageal reflux disease)   . Glaucoma    left eye  . Hyperlipidemia   . Hypertension   . Hypothyroid   . Pericardial effusion 2017  . Pneumonia 2017  . Post-nasal drip 08/02/2016  . Runny nose    occ at times pe pt  . Wears glasses    Past Surgical History:  Procedure Laterality Date  . ANKLE SURGERY Right 2010   [facility] plate and 6 screws still in  . CHOLECYSTECTOMY  1995   laparoscopic  . EYE SURGERY Left 2003   lens replacement for cataract  . KNEE SURGERY Right 1988   arthroscopic  . radioactive iodine to thryoid  2000  . TOE SURGERY Left 1972   great toe  . VITRECTOMY  1999    Home Medications:  Medications Prior to Admission  Medication Sig Dispense Refill Last Dose  . amLODipine (NORVASC) 5 MG tablet Take 1 tablet (5 mg total) by mouth daily. 30 tablet 0 09/16/2020 at 0400  . aspirin EC 81 MG tablet Take 81 mg by mouth at bedtime.   Past Week at Unknown time  . calcium carbonate (TUMS - DOSED IN MG ELEMENTAL CALCIUM) 500 MG chewable tablet Chew 1 tablet by mouth 2 (two) times daily as needed for indigestion or heartburn.     . Lancets (ONETOUCH ULTRASOFT) lancets TEST six times a day  0 09/16/2020 at Unknown time  .  levothyroxine (SYNTHROID, LEVOTHROID) 125 MCG tablet Take 125 mcg by mouth daily.   09/16/2020 at 0400  . losartan (COZAAR) 100 MG tablet Take 100 mg by mouth daily.   09/15/2020 at Unknown time  . Polyethylene Glycol 3350 (MIRALAX PO) Take 1 tablet by mouth at bedtime. 1 capful   09/15/2020 at Unknown time  . rosuvastatin (CRESTOR) 5 MG tablet TAKE 1 TABLET(5 MG) BY MOUTH DAILY AT 6 PM (Patient taking differently: at bedtime.) 30 tablet 11 09/15/2020 at Unknown time  . tadalafil (CIALIS) 5 MG tablet Take 5 mg by mouth daily.   09/16/2020 at 0400  . tamsulosin (FLOMAX) 0.4 MG CAPS capsule Take 0.4 mg by mouth at bedtime.   09/15/2020 at Unknown time  . tetrahydrozoline-zinc (VISINE-AC) 0.05-0.25 % ophthalmic solution 2 drops 3 (three) times daily as needed.   09/16/2020 at 0400  . timolol (BETIMOL) 0.5 % ophthalmic solution Place 1 drop into the left eye 2 (two) times daily.   09/16/2020 at 0400  . Insulin Infusion Pump DEVI by Does not apply route. Novolog Insulin    at continous  . NOVOLOG 100 UNIT/ML injection USE 50 UNITS IN INSULIN PUMP ONCE DAILY  0  at continous   Allergies: No Known Allergies  Family History  Problem Relation Age of Onset  . Bone cancer Father    Social History:  reports that he has never smoked. He has never used smokeless tobacco. He reports that he does not drink alcohol and does not use drugs.  ROS: A complete review of systems was performed.  All systems are negative except for pertinent findings as noted. ROS   Physical Exam:  Vital signs in last 24 hours: Temp:  [98 F (36.7 C)] 98 F (36.7 C) (03/15 0621) Pulse Rate:  [91] 91 (03/15 0621) Resp:  [16] 16 (03/15 0621) BP: (127)/(82) 127/82 (03/15 0621) SpO2:  [100 %] 100 % (03/15 0621) Weight:  [84.9 kg] 84.9 kg (03/15 0621) General:  Alert and oriented, No acute distress HEENT: Normocephalic, atraumatic Cardiovascular: Regular rate and rhythm Lungs: Regular rate and effort Abdomen: Soft, nontender,  nondistended, no abdominal masses Back: No CVA tenderness Extremities: No edema Neurologic: Grossly intact  Laboratory Data:  Results for orders placed or performed during the hospital encounter of 09/16/20 (from the past 24 hour(s))  I-STAT, chem 8     Status: Abnormal   Collection Time: 09/16/20  6:42 AM  Result Value Ref Range   Sodium 142 135 - 145 mmol/L   Potassium 4.5 3.5 - 5.1 mmol/L   Chloride 105 98 - 111 mmol/L   BUN 36 (H) 8 - 23 mg/dL   Creatinine, Ser 6.57 (H) 0.61 - 1.24 mg/dL   Glucose, Bld 846 (H) 70 - 99 mg/dL   Calcium, Ion 9.62 9.52 - 1.40 mmol/L   TCO2 26 22 - 32 mmol/L   Hemoglobin 13.9 13.0 - 17.0 g/dL   HCT 84.1 32.4 - 40.1 %   Recent Results (from the past 240 hour(s))  SARS CORONAVIRUS 2 (TAT 6-24 HRS) Nasopharyngeal Nasopharyngeal Swab     Status: None   Collection Time: 09/13/20  1:46 PM   Specimen: Nasopharyngeal Swab  Result Value Ref Range Status   SARS Coronavirus 2 NEGATIVE NEGATIVE Final    Comment: (NOTE) SARS-CoV-2 target nucleic acids are NOT DETECTED.  The SARS-CoV-2 RNA is generally detectable in upper and lower respiratory specimens during the acute phase of infection. Negative results do not preclude SARS-CoV-2 infection, do not rule out co-infections with other pathogens, and should not be used as the sole basis for treatment or other patient management decisions. Negative results must be combined with clinical observations, patient history, and epidemiological information. The expected result is Negative.  Fact Sheet for Patients: HairSlick.no  Fact Sheet for Healthcare Providers: quierodirigir.com  This test is not yet approved or cleared by the [place] FDA and  has been authorized for detection and/or diagnosis of SARS-CoV-2 by FDA under an Emergency Use Authorization (EUA). This EUA will remain  in effect (meaning this test can be used) for the duration of  the COVID-19 declaration under Se ction 564(b)(1) of the Act, 21 U.S.C. section 360bbb-3(b)(1), unless the authorization is terminated or revoked sooner.  Performed at [facility], [address], [place], [place] [address]    Creatinine: Recent Labs    09/16/20 3664  CREATININE 1.70*    Impression/Assessment/plan:  I discussed with the patient the nature, potential benefits, risks and alternatives to prostatic urethral lift, including side effects of the proposed treatment, the likelihood of the patient achieving the goals of the procedure, and any potential problems that might occur during the procedure or recuperation.  We discussed he may need a Foley catheter postop.  We  discussed that but citations for flow and irritative symptoms. All questions answered. Patient elects to proceed.    Jerilee Field 09/16/2020, 7:27 AM

## 2020-09-16 NOTE — Anesthesia Procedure Notes (Signed)
Procedure Name: LMA Insertion Date/Time: 09/16/2020 7:49 AM Performed by: Earmon Phoenix, CRNA Pre-anesthesia Checklist: Patient identified, Emergency Drugs available, Suction available, Patient being monitored and Timeout performed Patient Re-evaluated:Patient Re-evaluated prior to induction Oxygen Delivery Method: Circle system utilized Preoxygenation: Pre-oxygenation with 100% oxygen Induction Type: IV induction Ventilation: Mask ventilation with difficulty LMA: LMA inserted LMA Size: 5.0 Number of attempts: 1 Placement Confirmation: positive ETCO2,  CO2 detector and breath sounds checked- equal and bilateral Tube secured with: Tape Dental Injury: Teeth and Oropharynx as per pre-operative assessment  Comments: Limited neck extension and unable to open mouth initially to insert airway;ventilation with 2 hand jaw thrust;SaO2 maintained throughout until relaxed and then LMA inserted easily with good seal

## 2020-09-16 NOTE — Anesthesia Postprocedure Evaluation (Signed)
Anesthesia Post Note  Patient: Tom Baker  Procedure(s) Performed: CYSTOSCOPY WITH INSERTION OF UROLIFT; URETHRAL DILATION (N/A Prostate)     Patient location during evaluation: PACU Anesthesia Type: General Level of consciousness: awake and alert Pain management: pain level controlled Vital Signs Assessment: post-procedure vital signs reviewed and stable Respiratory status: spontaneous breathing, nonlabored ventilation, respiratory function stable and patient connected to nasal cannula oxygen Cardiovascular status: blood pressure returned to baseline and stable Postop Assessment: no apparent nausea or vomiting Anesthetic complications: no   No complications documented.  Last Vitals:  Vitals:   09/16/20 0909 09/16/20 0945  BP:  114/75  Pulse: 91 88  Resp: 16 18  Temp: 36.6 C 36.5 C  SpO2: 99% 100%    Last Pain:  Vitals:   09/16/20 0945  TempSrc:   PainSc: 1                  Shelton Silvas

## 2020-09-16 NOTE — Anesthesia Preprocedure Evaluation (Signed)
Anesthesia Evaluation  Patient identified by MRN, date of birth, ID band Patient awake    Reviewed: Allergy & Precautions, NPO status , Patient's Chart, lab work & pertinent test results  Airway Mallampati: III  TM Distance: >3 FB Neck ROM: Full    Dental  (+) Teeth Intact, Dental Advisory Given   Pulmonary    breath sounds clear to auscultation       Cardiovascular hypertension, Pt. on medications  Rhythm:Regular Rate:Normal     Neuro/Psych negative neurological ROS  negative psych ROS   GI/Hepatic Neg liver ROS, GERD  ,  Endo/Other  diabetes, Type 1, Insulin DependentHypothyroidism   Renal/GU   negative genitourinary   Musculoskeletal negative musculoskeletal ROS (+)   Abdominal Normal abdominal exam  (+)   Peds  Hematology   Anesthesia Other Findings   Reproductive/Obstetrics negative OB ROS                            Anesthesia Physical Anesthesia Plan  ASA: II  Anesthesia Plan: General   Post-op Pain Management:    Induction: Intravenous  PONV Risk Score and Plan: 3 and Ondansetron, Midazolam and Scopolamine patch - Pre-op  Airway Management Planned: LMA  Additional Equipment: None  Intra-op Plan:   Post-operative Plan: Extubation in OR  Informed Consent: I have reviewed the patients History and Physical, chart, labs and discussed the procedure including the risks, benefits and alternatives for the proposed anesthesia with the patient or authorized representative who has indicated his/her understanding and acceptance.     Dental advisory given  Plan Discussed with: CRNA  Anesthesia Plan Comments:        Anesthesia Quick Evaluation

## 2020-09-17 ENCOUNTER — Encounter (HOSPITAL_BASED_OUTPATIENT_CLINIC_OR_DEPARTMENT_OTHER): Payer: Self-pay | Admitting: Urology

## 2020-10-02 ENCOUNTER — Other Ambulatory Visit: Payer: Self-pay | Admitting: Cardiovascular Disease

## 2020-11-18 ENCOUNTER — Other Ambulatory Visit: Payer: Self-pay | Admitting: Cardiovascular Disease

## 2020-11-24 ENCOUNTER — Ambulatory Visit: Payer: BC Managed Care – PPO | Admitting: Cardiovascular Disease

## 2020-12-03 DIAGNOSIS — Z9289 Personal history of other medical treatment: Secondary | ICD-10-CM

## 2020-12-03 HISTORY — DX: Personal history of other medical treatment: Z92.89

## 2020-12-13 ENCOUNTER — Emergency Department (HOSPITAL_COMMUNITY): Payer: BC Managed Care – PPO | Admitting: Anesthesiology

## 2020-12-13 ENCOUNTER — Encounter (HOSPITAL_COMMUNITY)
Admission: EM | Disposition: A | Discharge status: Home Health | Payer: Self-pay | Source: Home / Self Care | Attending: Family Medicine

## 2020-12-13 ENCOUNTER — Other Ambulatory Visit: Payer: Self-pay

## 2020-12-13 ENCOUNTER — Encounter (HOSPITAL_COMMUNITY): Payer: Self-pay

## 2020-12-13 ENCOUNTER — Inpatient Hospital Stay (HOSPITAL_COMMUNITY)
Admission: EM | Admit: 2020-12-13 | Discharge: 2020-12-18 | DRG: 481 | Disposition: A | Discharge status: Home Health | Payer: BC Managed Care – PPO | Attending: Internal Medicine | Admitting: Internal Medicine

## 2020-12-13 ENCOUNTER — Emergency Department (HOSPITAL_COMMUNITY): Payer: BC Managed Care – PPO

## 2020-12-13 DIAGNOSIS — E1065 Type 1 diabetes mellitus with hyperglycemia: Secondary | ICD-10-CM | POA: Diagnosis present

## 2020-12-13 DIAGNOSIS — M4802 Spinal stenosis, cervical region: Secondary | ICD-10-CM | POA: Diagnosis present

## 2020-12-13 DIAGNOSIS — W1830XA Fall on same level, unspecified, initial encounter: Secondary | ICD-10-CM | POA: Diagnosis present

## 2020-12-13 DIAGNOSIS — Z20822 Contact with and (suspected) exposure to covid-19: Secondary | ICD-10-CM | POA: Diagnosis present

## 2020-12-13 DIAGNOSIS — Z9641 Presence of insulin pump (external) (internal): Secondary | ICD-10-CM | POA: Diagnosis present

## 2020-12-13 DIAGNOSIS — T148XXA Other injury of unspecified body region, initial encounter: Secondary | ICD-10-CM

## 2020-12-13 DIAGNOSIS — W19XXXA Unspecified fall, initial encounter: Secondary | ICD-10-CM | POA: Diagnosis present

## 2020-12-13 DIAGNOSIS — Y92008 Other place in unspecified non-institutional (private) residence as the place of occurrence of the external cause: Secondary | ICD-10-CM

## 2020-12-13 DIAGNOSIS — E1040 Type 1 diabetes mellitus with diabetic neuropathy, unspecified: Secondary | ICD-10-CM | POA: Diagnosis present

## 2020-12-13 DIAGNOSIS — M6250 Muscle wasting and atrophy, not elsewhere classified, unspecified site: Secondary | ICD-10-CM | POA: Diagnosis present

## 2020-12-13 DIAGNOSIS — E039 Hypothyroidism, unspecified: Secondary | ICD-10-CM | POA: Diagnosis present

## 2020-12-13 DIAGNOSIS — E109 Type 1 diabetes mellitus without complications: Secondary | ICD-10-CM | POA: Diagnosis present

## 2020-12-13 DIAGNOSIS — D62 Acute posthemorrhagic anemia: Secondary | ICD-10-CM | POA: Diagnosis not present

## 2020-12-13 DIAGNOSIS — S72001A Fracture of unspecified part of neck of right femur, initial encounter for closed fracture: Secondary | ICD-10-CM | POA: Diagnosis not present

## 2020-12-13 DIAGNOSIS — M4712 Other spondylosis with myelopathy, cervical region: Secondary | ICD-10-CM | POA: Diagnosis present

## 2020-12-13 DIAGNOSIS — E785 Hyperlipidemia, unspecified: Secondary | ICD-10-CM | POA: Diagnosis present

## 2020-12-13 DIAGNOSIS — Z419 Encounter for procedure for purposes other than remedying health state, unspecified: Secondary | ICD-10-CM

## 2020-12-13 DIAGNOSIS — N400 Enlarged prostate without lower urinary tract symptoms: Secondary | ICD-10-CM | POA: Diagnosis present

## 2020-12-13 DIAGNOSIS — K219 Gastro-esophageal reflux disease without esophagitis: Secondary | ICD-10-CM | POA: Diagnosis present

## 2020-12-13 DIAGNOSIS — I10 Essential (primary) hypertension: Secondary | ICD-10-CM | POA: Diagnosis present

## 2020-12-13 DIAGNOSIS — S7221XA Displaced subtrochanteric fracture of right femur, initial encounter for closed fracture: Secondary | ICD-10-CM | POA: Diagnosis present

## 2020-12-13 DIAGNOSIS — Z79899 Other long term (current) drug therapy: Secondary | ICD-10-CM

## 2020-12-13 DIAGNOSIS — W010XXA Fall on same level from slipping, tripping and stumbling without subsequent striking against object, initial encounter: Secondary | ICD-10-CM

## 2020-12-13 DIAGNOSIS — E559 Vitamin D deficiency, unspecified: Secondary | ICD-10-CM | POA: Diagnosis present

## 2020-12-13 DIAGNOSIS — Z79890 Hormone replacement therapy (postmenopausal): Secondary | ICD-10-CM

## 2020-12-13 DIAGNOSIS — N179 Acute kidney failure, unspecified: Secondary | ICD-10-CM | POA: Diagnosis present

## 2020-12-13 DIAGNOSIS — Z7982 Long term (current) use of aspirin: Secondary | ICD-10-CM

## 2020-12-13 DIAGNOSIS — Z794 Long term (current) use of insulin: Secondary | ICD-10-CM | POA: Diagnosis not present

## 2020-12-13 DIAGNOSIS — I739 Peripheral vascular disease, unspecified: Secondary | ICD-10-CM | POA: Diagnosis not present

## 2020-12-13 HISTORY — PX: INTRAMEDULLARY (IM) NAIL INTERTROCHANTERIC: SHX5875

## 2020-12-13 LAB — COMPREHENSIVE METABOLIC PANEL
ALT: 16 U/L (ref 0–44)
AST: 24 U/L (ref 15–41)
Albumin: 3.6 g/dL (ref 3.5–5.0)
Alkaline Phosphatase: 50 U/L (ref 38–126)
Anion gap: 6 (ref 5–15)
BUN: 23 mg/dL (ref 8–23)
CO2: 23 mmol/L (ref 22–32)
Calcium: 8.4 mg/dL — ABNORMAL LOW (ref 8.9–10.3)
Chloride: 109 mmol/L (ref 98–111)
Creatinine, Ser: 1.39 mg/dL — ABNORMAL HIGH (ref 0.61–1.24)
GFR, Estimated: 56 mL/min — ABNORMAL LOW (ref >60)
Glucose, Bld: 156 mg/dL — ABNORMAL HIGH (ref 70–99)
Potassium: 4.7 mmol/L (ref 3.5–5.1)
Sodium: 138 mmol/L (ref 135–145)
Total Bilirubin: 1.2 mg/dL (ref 0.3–1.2)
Total Protein: 5.9 g/dL — ABNORMAL LOW (ref 6.5–8.1)

## 2020-12-13 LAB — GLUCOSE, CAPILLARY
Glucose-Capillary: 195 mg/dL — ABNORMAL HIGH (ref 70–99)
Glucose-Capillary: 205 mg/dL — ABNORMAL HIGH (ref 70–99)
Glucose-Capillary: 209 mg/dL — ABNORMAL HIGH (ref 70–99)

## 2020-12-13 LAB — CBC
HCT: 36 % — ABNORMAL LOW (ref 39.0–52.0)
Hemoglobin: 12.1 g/dL — ABNORMAL LOW (ref 13.0–17.0)
MCH: 29.3 pg (ref 26.0–34.0)
MCHC: 33.6 g/dL (ref 30.0–36.0)
MCV: 87.2 fL (ref 80.0–100.0)
Platelets: 218 10*3/uL (ref 150–400)
RBC: 4.13 MIL/uL — ABNORMAL LOW (ref 4.22–5.81)
RDW: 13.5 % (ref 11.5–15.5)
WBC: 6.8 10*3/uL (ref 4.0–10.5)
nRBC: 0 % (ref 0.0–0.2)

## 2020-12-13 LAB — RESP PANEL BY RT-PCR (FLU A&B, COVID) ARPGX2
Influenza A by PCR: NEGATIVE
Influenza B by PCR: NEGATIVE
SARS Coronavirus 2 by RT PCR: NEGATIVE

## 2020-12-13 LAB — ABO/RH: ABO/RH(D): A POS

## 2020-12-13 SURGERY — FIXATION, FRACTURE, INTERTROCHANTERIC, WITH INTRAMEDULLARY ROD
Anesthesia: General | Site: Hip | Laterality: Right

## 2020-12-13 MED ORDER — SUCCINYLCHOLINE CHLORIDE 20 MG/ML IJ SOLN
INTRAMUSCULAR | Status: DC | PRN
  Administered 2020-12-13: 140 mg via INTRAVENOUS

## 2020-12-13 MED ORDER — PHENYLEPHRINE HCL (PRESSORS) 10 MG/ML IV SOLN
INTRAVENOUS | Status: DC | PRN
  Administered 2020-12-13: 80 ug via INTRAVENOUS
  Administered 2020-12-13: 40 ug via INTRAVENOUS

## 2020-12-13 MED ORDER — ONDANSETRON HCL 4 MG/2ML IJ SOLN
4.0000 mg | Freq: Once | INTRAMUSCULAR | Status: AC
  Administered 2020-12-13: 4 mg via INTRAVENOUS
  Filled 2020-12-13: qty 2

## 2020-12-13 MED ORDER — PHENYLEPHRINE HCL-NACL 10-0.9 MG/250ML-% IV SOLN
INTRAVENOUS | Status: DC | PRN
  Administered 2020-12-13: 50 ug/min via INTRAVENOUS

## 2020-12-13 MED ORDER — HYDROMORPHONE HCL 1 MG/ML IJ SOLN
1.0000 mg | Freq: Once | INTRAMUSCULAR | Status: AC
Start: 2020-12-13 — End: 2020-12-13
  Administered 2020-12-13: 1 mg via INTRAVENOUS
  Filled 2020-12-13: qty 1

## 2020-12-13 MED ORDER — MIDAZOLAM HCL 5 MG/5ML IJ SOLN
INTRAMUSCULAR | Status: DC | PRN
  Administered 2020-12-13: 1 mg via INTRAVENOUS

## 2020-12-13 MED ORDER — DEXTROSE 50 % IV SOLN: 12.5000 g | INTRAVENOUS | Status: AC

## 2020-12-13 MED ORDER — EPHEDRINE SULFATE-NACL 50-0.9 MG/10ML-% IV SOSY
PREFILLED_SYRINGE | INTRAVENOUS | Status: DC | PRN
  Administered 2020-12-13: 5 mg via INTRAVENOUS
  Administered 2020-12-13: 10 mg via INTRAVENOUS
  Administered 2020-12-13 – 2020-12-14 (×2): 5 mg via INTRAVENOUS

## 2020-12-13 MED ORDER — FENTANYL CITRATE (PF) 250 MCG/5ML IJ SOLN
INTRAMUSCULAR | Status: AC
  Filled 2020-12-13: qty 5

## 2020-12-13 MED ORDER — POVIDONE-IODINE 10 % EX SWAB: 2.0000 "application " | Freq: Once | CUTANEOUS | Status: DC

## 2020-12-13 MED ORDER — LACTATED RINGERS IV SOLN: INTRAVENOUS | Status: DC | PRN

## 2020-12-13 MED ORDER — CHLORHEXIDINE GLUCONATE 4 % EX LIQD
60.0000 mL | Freq: Once | CUTANEOUS | Status: DC
  Filled 2020-12-13: qty 60

## 2020-12-13 MED ORDER — MORPHINE SULFATE (PF) 2 MG/ML IV SOLN: 0.5000 mg | INTRAVENOUS | Status: DC | PRN

## 2020-12-13 MED ORDER — ROCURONIUM BROMIDE 10 MG/ML (PF) SYRINGE
PREFILLED_SYRINGE | INTRAVENOUS | Status: DC | PRN
  Administered 2020-12-13: 20 mg via INTRAVENOUS
  Administered 2020-12-13: 40 mg via INTRAVENOUS
  Administered 2020-12-13: 10 mg via INTRAVENOUS

## 2020-12-13 MED ORDER — DEXAMETHASONE SODIUM PHOSPHATE 10 MG/ML IJ SOLN
INTRAMUSCULAR | Status: DC | PRN
  Administered 2020-12-13: 10 mg via INTRAVENOUS

## 2020-12-13 MED ORDER — PROPOFOL 10 MG/ML IV BOLUS
INTRAVENOUS | Status: DC | PRN
  Administered 2020-12-13: 150 mg via INTRAVENOUS

## 2020-12-13 MED ORDER — ALBUMIN HUMAN 5 % IV SOLN: INTRAVENOUS | Status: DC | PRN

## 2020-12-13 MED ORDER — MIDAZOLAM HCL 2 MG/2ML IJ SOLN
INTRAMUSCULAR | Status: AC
  Filled 2020-12-13: qty 2

## 2020-12-13 MED ORDER — FENTANYL CITRATE (PF) 100 MCG/2ML IJ SOLN
INTRAMUSCULAR | Status: DC | PRN
  Administered 2020-12-13: 250 ug via INTRAVENOUS

## 2020-12-13 MED ORDER — INSULIN REGULAR(HUMAN) IN NACL 100-0.9 UT/100ML-% IV SOLN
INTRAVENOUS | Status: DC
  Administered 2020-12-13: 6 [IU]/h via INTRAVENOUS
  Filled 2020-12-13: qty 100

## 2020-12-13 MED ORDER — 0.9 % SODIUM CHLORIDE (POUR BTL) OPTIME
TOPICAL | Status: DC | PRN
  Administered 2020-12-13: 1000 mL

## 2020-12-13 MED ORDER — CEFAZOLIN SODIUM-DEXTROSE 2-4 GM/100ML-% IV SOLN
2.0000 g | INTRAVENOUS | Status: AC
  Administered 2020-12-13: 2 g via INTRAVENOUS
  Filled 2020-12-13 (×2): qty 100

## 2020-12-13 MED ORDER — SENNOSIDES-DOCUSATE SODIUM 8.6-50 MG PO TABS: 1.0000 | ORAL_TABLET | Freq: Every evening | ORAL | Status: DC | PRN

## 2020-12-13 MED ORDER — HYDROCODONE-ACETAMINOPHEN 5-325 MG PO TABS
1.0000 | ORAL_TABLET | Freq: Four times a day (QID) | ORAL | Status: DC | PRN
Start: 2020-12-13 — End: 2020-12-18

## 2020-12-13 MED ORDER — PROPOFOL 10 MG/ML IV BOLUS
INTRAVENOUS | Status: AC
  Filled 2020-12-13: qty 20

## 2020-12-13 MED ORDER — LIDOCAINE 2% (20 MG/ML) 5 ML SYRINGE
INTRAMUSCULAR | Status: DC | PRN
  Administered 2020-12-13: 20 mg via INTRAVENOUS

## 2020-12-13 SURGICAL SUPPLY — 67 items
BIT DRILL SHORT 4.2 (BIT) IMPLANT
BNDG COHESIVE 6X5 TAN STRL LF (GAUZE/BANDAGES/DRESSINGS) ×1 IMPLANT
BRUSH SCRUB EZ PLAIN DRY (MISCELLANEOUS) ×4 IMPLANT
CLEANER TIP ELECTROSURG 2X2 (MISCELLANEOUS) ×1 IMPLANT
COVER PERINEAL POST (MISCELLANEOUS) ×2 IMPLANT
COVER SURGICAL LIGHT HANDLE (MISCELLANEOUS) ×4 IMPLANT
COVER WAND RF STERILE (DRAPES) ×2 IMPLANT
DRAPE C-ARMOR (DRAPES) ×2 IMPLANT
DRAPE HALF SHEET 40X57 (DRAPES) IMPLANT
DRAPE INCISE IOBAN 66X45 STRL (DRAPES) ×1 IMPLANT
DRAPE ORTHO SPLIT 77X108 STRL (DRAPES) ×4
DRAPE STERI IOBAN 125X83 (DRAPES) ×2 IMPLANT
DRAPE SURG ORHT 6 SPLT 77X108 (DRAPES) IMPLANT
DRAPE U-SHAPE 47X51 STRL (DRAPES) ×2 IMPLANT
DRESSING MEPILEX FLEX 4X4 (GAUZE/BANDAGES/DRESSINGS) IMPLANT
DRILL BIT SHORT 4.2 (BIT) ×4
DRSG EMULSION OIL 3X3 NADH (GAUZE/BANDAGES/DRESSINGS) ×2 IMPLANT
DRSG MEPILEX BORDER 4X4 (GAUZE/BANDAGES/DRESSINGS) ×2 IMPLANT
DRSG MEPILEX BORDER 4X8 (GAUZE/BANDAGES/DRESSINGS) ×2 IMPLANT
DRSG MEPILEX FLEX 4X4 (GAUZE/BANDAGES/DRESSINGS) ×4
ELECT REM PT RETURN 9FT ADLT (ELECTROSURGICAL) ×2
ELECTRODE REM PT RTRN 9FT ADLT (ELECTROSURGICAL) ×1 IMPLANT
GLOVE BIO SURGEON STRL SZ7.5 (GLOVE) ×2 IMPLANT
GLOVE BIO SURGEON STRL SZ8 (GLOVE) ×3 IMPLANT
GLOVE BIOGEL PI IND STRL 7.5 (GLOVE) ×1 IMPLANT
GLOVE BIOGEL PI INDICATOR 7.5 (GLOVE) ×1
GLOVE OPTIFIT SS 6.0 STRL BRWN (GLOVE) ×2 IMPLANT
GLOVE OPTIFIT SS 7.5 STRL LX (GLOVE) ×1 IMPLANT
GLOVE SRG 8 PF TXTR STRL LF DI (GLOVE) ×1 IMPLANT
GLOVE SURG ENC MOIS LTX SZ6 (GLOVE) ×2 IMPLANT
GLOVE SURG UNDER POLY LF SZ8 (GLOVE) ×2
GOWN STRL REUS W/ TWL LRG LVL3 (GOWN DISPOSABLE) ×2 IMPLANT
GOWN STRL REUS W/ TWL XL LVL3 (GOWN DISPOSABLE) ×1 IMPLANT
GOWN STRL REUS W/TWL LRG LVL3 (GOWN DISPOSABLE) ×4
GOWN STRL REUS W/TWL XL LVL3 (GOWN DISPOSABLE) ×2
GUIDEWIRE 3.2X400 (WIRE) ×3 IMPLANT
KIT BASIN OR (CUSTOM PROCEDURE TRAY) ×2 IMPLANT
KIT TURNOVER KIT B (KITS) ×2 IMPLANT
MANIFOLD NEPTUNE II (INSTRUMENTS) ×2 IMPLANT
NAIL CANN TI FEM RT GT 11X420 (Nail) ×1 IMPLANT
NS IRRIG 1000ML POUR BTL (IV SOLUTION) ×2 IMPLANT
PACK GENERAL/GYN (CUSTOM PROCEDURE TRAY) ×2 IMPLANT
PAD ARMBOARD 7.5X6 YLW CONV (MISCELLANEOUS) ×4 IMPLANT
REAMER ROD DEEP FLUTE 2.5X950 (INSTRUMENTS) ×1 IMPLANT
SCREW LOCK IM 5X46X125 (Spacer) ×1 IMPLANT
SCREW LOCK IM 5X58 (Screw) ×1 IMPLANT
SCREW LOCK IM NAIL 5X52 (Screw) ×1 IMPLANT
SCREW RECON 6.5X100MM (Screw) ×1 IMPLANT
SCREW RECON 6.5X105MM (Screw) ×1 IMPLANT
SPONGE LAP 18X18 X RAY DECT (DISPOSABLE) ×2 IMPLANT
STAPLER VISISTAT 35W (STAPLE) ×2 IMPLANT
STOCKINETTE IMPERVIOUS LG (DRAPES) ×1 IMPLANT
SUT ETHILON 2 0 PSLX (SUTURE) ×2 IMPLANT
SUT ETHILON 3 0 PS 1 (SUTURE) ×2 IMPLANT
SUT FIBERTAPE CERCLAGE 2 48 (SUTURE) ×2 IMPLANT
SUT VIC AB 0 CT1 27 (SUTURE) ×2
SUT VIC AB 0 CT1 27XBRD ANBCTR (SUTURE) ×1 IMPLANT
SUT VIC AB 1 CT1 27 (SUTURE) ×2
SUT VIC AB 1 CT1 27XBRD ANBCTR (SUTURE) ×1 IMPLANT
SUT VIC AB 2-0 CT1 27 (SUTURE) ×2
SUT VIC AB 2-0 CT1 TAPERPNT 27 (SUTURE) ×1 IMPLANT
TOWEL GREEN STERILE (TOWEL DISPOSABLE) ×4 IMPLANT
TOWEL GREEN STERILE FF (TOWEL DISPOSABLE) ×2 IMPLANT
TRAY FOLEY W/BAG SLVR 14FR (SET/KITS/TRAYS/PACK) ×1 IMPLANT
TUBE CONNECTING 12X1/4 (SUCTIONS) ×1 IMPLANT
WATER STERILE IRR 1000ML POUR (IV SOLUTION) ×2 IMPLANT
YANKAUER SUCT BULB TIP NO VENT (SUCTIONS) ×2 IMPLANT

## 2020-12-13 NOTE — ED Triage Notes (Signed)
Pt arrived via [facility] from home. Pt was walking dog and the dog went one way and the pt leg twisted and he fell. Per EMS pt has crepitus and deformity of right upper leg. Pt is A&Ox4. Per EMS they gave fentanyl 100 mcg IV.

## 2020-12-13 NOTE — Consult Note (Signed)
Orthopaedic Trauma Service (OTS) Consult   Patient ID: Tom Baker MRN: [id number] DOB/AGE: 1955-10-22 65 y.o.   Reason for Consult: Right femoral shaft fracture Referring Physician: Cathren Laine, MD  HPI: Tom Baker is an 65 y.o. male who twisted and fell while managing his dog on a leash resulting in acute right hip pain, deformity, and inability to bear weight. 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 He denies other injuries, new numbness, and any new motor loss.   Past Medical History:  Diagnosis Date   BPH (benign prostatic hyperplasia)    Complication of anesthesia    hard to urinate after surgery ankle surgery 2005 [facility]   Diabetes mellitus type 1 (HCC)    GERD (gastroesophageal reflux disease)    Glaucoma    left eye   Hyperlipidemia    Hypertension    Hypothyroid    Pericardial effusion 2017   Pneumonia 2017   Post-nasal drip 08/02/2016   Runny nose    occ at times pe pt   Wears glasses     Past Surgical History:  Procedure Laterality Date   ANKLE SURGERY Right 2010   [facility] plate and 6 screws still in   CHOLECYSTECTOMY  1995   laparoscopic   CYSTOSCOPY WITH INSERTION OF UROLIFT N/A 09/16/2020   Procedure: CYSTOSCOPY WITH INSERTION OF UROLIFT; URETHRAL DILATION;  Surgeon: Jerilee Field, MD;  Location: [facility];  Service: Urology;  Laterality: N/A;   EYE SURGERY Left 2003   lens replacement for cataract   KNEE SURGERY Right 1988   arthroscopic   radioactive iodine to thryoid  2000   TOE SURGERY Left 1972   great toe   VITRECTOMY  1999    Family History  Problem Relation Age of Onset   Bone cancer Father     Social History:  reports that he has never smoked. He has never used smokeless tobacco. He reports that he does not drink alcohol and does not use drugs.  Allergies: No Known Allergies  Medications: Reviewed with patient;  Medtronic insulin pump  Results for orders placed or performed during the hospital encounter of 12/13/20 (from the past 48 hour(s))  CBC     Status: Abnormal   Collection Time: 12/13/20  5:18 PM  Result Value Ref Range   WBC 6.8 4.0 - 10.5 K/uL   RBC 4.13 (L) 4.22 - 5.81 MIL/uL   Hemoglobin 12.1 (L) 13.0 - 17.0 g/dL   HCT 56.8 (L) 12.7 - 51.7 %   MCV 87.2 80.0 - 100.0 fL   MCH 29.3 26.0 - 34.0 pg   MCHC 33.6 30.0 - 36.0 g/dL   RDW 00.1 74.9 - 44.9 %   Platelets 218 150 - 400 K/uL   nRBC 0.0 0.0 - 0.2 %    Comment: Performed at [facility], [address], [place], [place] [address]  Comprehensive metabolic panel     Status: Abnormal   Collection Time: 12/13/20  5:18 PM  Result Value Ref Range   Sodium 138 135 - 145 mmol/L   Potassium 4.7 3.5 - 5.1 mmol/L    Comment: SLIGHT HEMOLYSIS   Chloride 109 98 - 111 mmol/L   CO2 23 22 - 32 mmol/L   Glucose, Bld 156 (H) 70 - 99 mg/dL    Comment: Glucose reference range applies only to samples taken after fasting for at  least 8 hours.   BUN 23 8 - 23 mg/dL   Creatinine, Ser 7.78 (H) 0.61 - 1.24 mg/dL   Calcium 8.4 (L) 8.9 - 10.3 mg/dL   Total Protein 5.9 (L) 6.5 - 8.1 g/dL   Albumin 3.6 3.5 - 5.0 g/dL   AST 24 15 - 41 U/L   ALT 16 0 - 44 U/L   Alkaline Phosphatase 50 38 - 126 U/L   Total Bilirubin 1.2 0.3 - 1.2 mg/dL   GFR, Estimated 56 (L) >60 mL/min    Comment: (NOTE) Calculated using the CKD-EPI Creatinine Equation (2021)    Anion gap 6 5 - 15    Comment: Performed at [facility], [address] [place], [place] [address]    DG Chest Port 1 View  Result Date: 12/13/2020 CLINICAL DATA:  Right hip fracture. EXAM: PORTABLE CHEST 1 VIEW COMPARISON:  CTA chest dated December 13, 2015. Chest x-ray dated December 12, 2015. FINDINGS: The heart size and mediastinal contours are within normal limits. Both lungs are clear. The visualized skeletal structures are unremarkable. IMPRESSION: No active disease. Electronically Signed   By:  Obie Dredge M.D.   On: 12/13/2020 18:01   DG Hip [place] or [place] Pelvis 1 View Right  Result Date: 12/13/2020 CLINICAL DATA:  Fall. EXAM: DG HIP (WITH OR WITHOUT PELVIS) 1V PORT RIGHT COMPARISON:  None. FINDINGS: Acute displaced comminuted right subtrochanteric femur fracture. Mild bilateral hip osteoarthritis. The pubic symphysis and sacroiliac joints are intact. IMPRESSION: 1. Acute displaced right subtrochanteric femur fracture. Electronically Signed   By: Obie Dredge M.D.   On: 12/13/2020 18:00    Intake/Output    None      ROS No recent fever, bleeding abnormalities, urologic dysfunction, GI problems, or weight gain.  Height 6\' 3"  (1.905 m), weight 85.3 kg, SpO2 98 %. Physical Exam NCAT, very pleasant No active wheezing RLE Tender femur, flexed but we were able to position out to length  Knee nontender with hemarthrosis  Edema/ swelling controlled  Sens: DPN, SPN, TN intact  Motor: EHL, FHL, and lessor toe ext and flex all intact grossly  Brisk cap refill, warm to touch, DP palp RUEx shoulder, elbow, wrist, digits- no skin wounds, nontender, no instability, no blocks to motion  Sens  Ax/R/M/U intact  Mot   Ax/ R/ PIN/ M/ AIN/ U intact  Rad 2+ LUEx shoulder, elbow, wrist, digits- no skin wounds, nontender, no instability, no blocks to motion  Sens  Ax/R/M/U intact  Mot   Ax/ R/ PIN/ M/ AIN/ U intact  Rad 2+ LLE No traumatic wounds, ecchymosis, or rash  Nontender  No knee or ankle effusion  Knee stable to varus/ valgus and anterior/posterior stress  Sens DPN, SPN, TN intact  Motor EHL, ext, flex, evers 5/5  No significant edema, CR 2 sec lower calf    Assessment/Plan:  Right proximal femoral shaft fracture Poor controlled DM with pump  I discussed with the patient and his wife the risks and benefits of surgery, including the possibility of infection, nerve injury, vessel injury, wound breakdown, arthritis, symptomatic hardware, DVT/ PE, loss of motion,  malunion, nonunion, and need for further surgery among others.  We also specifically discussed the possibility of transfusion as well as elevated risk of delayed or nonunion and infection because of his diabetes.  He acknowledged these risks and wished to proceed.  Post op plan: Weightbearing: TDWB RLE Insicional and dressing care: Reinforce dressings as needed Showering: Yes  VTE prophylaxis: Lovenox 40mg  qd  3 weeks Pain control: Norco Follow - up plan: 2 weeks Contact information:  MD, Myrene Galas PA  Montez Morita, MD [facility], [facility] [phone]  12/13/2020, 6:43 PM  [facility] [address] [place] [phone] (F)    After 5pm and on the weekends please log on to Amion, go to orthopaedics and the look under the [facility] Call for the provider(s) on call. You can also call our office at [phone] and then follow the prompts to be connected to the call team.

## 2020-12-13 NOTE — H&P (Signed)
History and Physical    Tom Baker FXT:[id number] DOB: Sep 23, 1955 DOA: 12/13/2020  PCP: Irena Reichmann, DO Consultants:  endocrinology: Dr. Lisabeth Devoid, cardiology: Dr. Duke Salvia, urology: Dr. Mena Goes Patient coming from:  Home - lives with wife.    Chief Complaint: fall onto right hip  HPI: Tom Baker is a 65 y.o. male with medical history significant of type 1 diabetes on insulin pump, hypertension, hypothyroidism, hyperlipidemia and BPH who presented to the ER status post fall onto right hip.  He had taken his dog out to go to the bathroom and was standing on the porch talking to his neighbor when he got caught up trying to get his dog and fell onto his right hip.  He had immediate pain in right hip post fall, rated 10/10. Pain was severe and constant and not radiating.  He has movement and sensation in his entire leg down to his toes.  Denies any weakness or tingling.  He can not bear weight. He takes a baby aspirin daily.  He is overall healthy and feeling well specifically denies fever chills, recent infection, chest pain palpitations, nausea vomiting diarrhea or abdominal pain.  ED Course: vitals: Blood pressure 105/65, heart rate 71, respiratory rate 18, oxygen 100% on room air. Xray showed acute displaced right subtrochanteric femur fracture. ortho consulted. Possible OR tonight.   Review of Systems: As per HPI; otherwise review of systems reviewed and negative.   Ambulatory Status:  Ambulates without assistance    Past Medical History:  Diagnosis Date   BPH (benign prostatic hyperplasia)    Complication of anesthesia    hard to urinate after surgery ankle surgery 2005 [facility]   Diabetes mellitus type 1 (HCC)    GERD (gastroesophageal reflux disease)    Glaucoma    left eye   Hyperlipidemia    Hypertension    Hypothyroid    Pericardial effusion 2017   Pneumonia 2017   Post-nasal drip 08/02/2016   Runny nose    occ at times pe pt   Wears glasses     Past Surgical History:   Procedure Laterality Date   ANKLE SURGERY Right 2010   [facility]ist plate and 6 screws still in   CHOLECYSTECTOMY  1995   laparoscopic   CYSTOSCOPY WITH INSERTION OF UROLIFT N/A 09/16/2020   Procedure: CYSTOSCOPY WITH INSERTION OF UROLIFT; URETHRAL DILATION;  Surgeon: Jerilee Field, MD;  Location: [facility];  Service: Urology;  Laterality: N/A;   EYE SURGERY Left 2003   lens replacement for cataract   KNEE SURGERY Right 1988   arthroscopic   radioactive iodine to thryoid  2000   TOE SURGERY Left 1972   great toe   VITRECTOMY  1999    Social History   Socioeconomic History   Marital status: Married    Spouse name: Not on file   Number of children: Not on file   Years of education: Not on file   Highest education level: Not on file  Occupational History   Not on file  Tobacco Use   Smoking status: Never   Smokeless tobacco: Never  Vaping Use   Vaping Use: Never used  Substance and Sexual Activity   Alcohol use: No    Alcohol/week: 0.0 standard drinks    Comment: last used 1992   Drug use: No   Sexual activity: Not on file  Other Topics Concern   Not on file  Social History Narrative   Works for [employer].    Social Determinants  of Health   Financial Resource Strain: Not on file  Food Insecurity: Not on file  Transportation Needs: Not on file  Physical Activity: Not on file  Stress: Not on file  Social Connections: Not on file  Intimate Partner Violence: Not on file    No Known Allergies  Family History  Problem Relation Age of Onset   Bone cancer Father     Prior to Admission medications   Medication Sig Start Date End Date Taking? Authorizing Provider  amLODipine (NORVASC) 5 MG tablet Take 1 tablet (5 mg total) by mouth daily. 12/17/15   Alison Murray, MD  aspirin EC 81 MG tablet Take 81 mg by mouth at bedtime.    [provider]  calcium carbonate (TUMS - DOSED IN MG ELEMENTAL CALCIUM) 500 MG chewable tablet Chew  1 tablet by mouth 2 (two) times daily as needed for indigestion or heartburn.    [provider]  Insulin Infusion Pump DEVI by Does not apply route. Novolog Insulin    [provider]  Lancets (ONETOUCH ULTRASOFT) lancets 1 each by Other route 6 (six) times daily. 10/10/15   [provider]  levothyroxine (SYNTHROID, LEVOTHROID) 125 MCG tablet Take 125 mcg by mouth daily.    [provider]  losartan (COZAAR) 100 MG tablet Take 100 mg by mouth daily.    [provider]  NOVOLOG 100 UNIT/ML injection Inject 50 Units into the skin daily. 12/10/15   [provider]  [facility] VERIO test strip 1 each by Other route 6 (six) times daily. 10/14/20   [provider]  Polyethylene Glycol 3350 (MIRALAX PO) Take 1 tablet by mouth at bedtime. 1 capful    [provider]  rosuvastatin (CRESTOR) 5 MG tablet TAKE 1 TABLET(5 MG) BY MOUTH DAILY AT 6 PM Patient taking differently: Take 5 mg by mouth daily. 11/18/20   Chilton Si, MD  tadalafil (CIALIS) 5 MG tablet Take 5 mg by mouth daily.    [provider]  tamsulosin (FLOMAX) 0.4 MG CAPS capsule Take 0.4 mg by mouth at bedtime.    [provider]  tetrahydrozoline-zinc (VISINE-AC) 0.05-0.25 % ophthalmic solution Place 2 drops into both eyes 3 (three) times daily as needed.    [provider]  timolol (BETIMOL) 0.5 % ophthalmic solution Place 1 drop into the left eye 2 (two) times daily.    [provider]  timolol (TIMOPTIC) 0.5 % ophthalmic solution Place 1 drop into the left eye 2 (two) times daily. 11/03/20   [provider]    Physical Exam: Vitals:   12/13/20 1815 12/13/20 1830 12/13/20 1845 12/13/20 1900  BP: (!) 110/58 121/74 105/65 (!) 120/92  Pulse: 78 83 71 80  Resp: (!) 21 18 18 18   SpO2: 97% 99% 100% 98%  Weight:      Height:         General:  Appears calm and comfortable and is in NAD Eyes:  PERRL, EOMI, normal lids,  iris ENT:  grossly normal hearing, lips & tongue, mmm; appropriate dentition Neck:  no LAD, masses or thyromegaly; no carotid bruits Cardiovascular:  RRR, no m/r/g. No LE edema.  Respiratory:   CTA bilaterally with no wheezes/rales/rhonchi.  Normal respiratory effort. Abdomen:  soft, NT, ND, NABS Back:   normal alignment, no CVAT Skin:  no rash or induration seen on limited exam. No skin abrasion/tear Musculoskeletal:  right leg shortened and abducted. TTP over right hip. Minimal edeam in right lower leg  at baseline. Right pedal pulses 2+. Movement intact and sensation intact.     Limited foot exam with no ulcerations.  2+ distal pulses. Psychiatric:  grossly normal mood and affect, speech fluent and appropriate, AOx3 Neurologic:  CN 2-12 grossly intact, moves all extremities in coordinated fashion, sensation intact    Radiological Exams on Admission: Independently reviewed - see discussion in A/P where applicable  DG Chest Port 1 View  Result Date: 12/13/2020 CLINICAL DATA:  Right hip fracture. EXAM: PORTABLE CHEST 1 VIEW COMPARISON:  CTA chest dated December 13, 2015. Chest x-ray dated December 12, 2015. FINDINGS: The heart size and mediastinal contours are within normal limits. Both lungs are clear. The visualized skeletal structures are unremarkable. IMPRESSION: No active disease. Electronically Signed   By: Obie DredgeWilliam T Derry M.D.   On: 12/13/2020 18:01   DG Hip [place] Pelvis 1 View Right  Result Date: 12/13/2020 CLINICAL DATA:  Fall. EXAM: DG HIP (WITH OR WITHOUT PELVIS) 1V PORT RIGHT COMPARISON:  None. FINDINGS: Acute displaced comminuted right subtrochanteric femur fracture. Mild bilateral hip osteoarthritis. The pubic symphysis and sacroiliac joints are intact. IMPRESSION: 1. Acute displaced right subtrochanteric femur fracture. Electronically Signed   By: Obie DredgeWilliam T Derry M.D.   On: 12/13/2020 18:00   DG FEMUR PORT, MIN 2 VIEWS RIGHT  Result Date: 12/13/2020 CLINICAL DATA:   Right proximal femoral fracture. EXAM: RIGHT FEMUR PORTABLE 2 VIEW COMPARISON:  None. FINDINGS: The distal femur is intact.  No other fractures identified. IMPRESSION: The distal femur is intact with no other fractures. Electronically Signed   By: Gerome Samavid  Williams III M.D   On: 12/13/2020 18:52    EKG: not released. Ordered.    Labs on Admission: I have personally reviewed the available labs and imaging studies at the time of the admission.  Pertinent labs:  Covid negative  Hgb: 12.1 Creatinine: 1.39 Glucose: 156   Assessment/Plan Principal Problem:   Closed right hip fracture (HCC) -Ortho trauma has already been consulted with plans to take him to the OR tonight -N.p.o. -post surgery per Ortho recommendations  Active Problems:   AKI (acute kidney injury) (HCC) -AKI of 1.39.  He has no history to his knowledge of any kidney disease -Has had a few bumps in his creatinines looking back through epic -likely preRenal as he has not eaten or drank anything since 2:00 this afternoon -Oral rehydration  postsurgery and recheck BMP in a.m.    Hypertension -Well-controlled continue home meds    Hypothyroidism -TSH pending continue home meds    Diabetes mellitus type 1 (HCC) -Recent A1c not to goal 8.0 . Followed by endocrinology  -Continue insulin pump settings. Since he has not been eating frequent  Accu-Cheks to 2 q. hours x2 then every 4 hours -Hopefully be able to eat after surgery. -Hypoglycemic precautions ordered    Hyperlipidemia -Continue Crestor    BPH (benign prostatic hyperplasia) -Continue cialis      Body mass index is 23.5 kg/m.    Level of care: Med-Surg DVT prophylaxis: SCDs Code Status:  Full - confirmed with patient Family Communication: wife present at bedside.  Alona BeneJoyce Zabriskie Disposition Plan:  The patient is from: home   Anticipated d/c date will depend on clinical response to treatment/surgery, but possibly as early as 1-2 days if he has excellent  response to treatment   Consults called: orthopedic trauma, dr. Carola FrostHandy by ER.   Admission status:  inpatient.    Orland MustardAllison Din Bookwalter MD [facility]  How to contact the [facility] Attending or Consulting provider 7A - 7P or covering provider during after hours 7P -7A, for this patient?  Check the care team in [facility] and look for a) attending/consulting [facility] provider listed and b) the [facility] team listed Log into [email or web address] and use [place]'s universal password to access. If you do not have the password, please contact the hospital operator. Locate the [facility] provider you are looking for under [facility] and page to a number that you can be directly reached. If you still have difficulty reaching the provider, please page the [facility] (Director on Call) for the Hospitalists listed on amion for assistance.   12/13/2020, 7:59 PM

## 2020-12-13 NOTE — ED Notes (Signed)
Pt has 1+ right posterior tibial pulse, cap refill less than 3 sec, warm to touch, pt able to wiggle toes, sensation at baseline.

## 2020-12-13 NOTE — Anesthesia Procedure Notes (Signed)
Procedure Name: Intubation Date/Time: 12/13/2020 8:51 PM Performed by: Edmonia Caprio, CRNA Pre-anesthesia Checklist: Patient identified, Emergency Drugs available, Suction available and Patient being monitored Patient Re-evaluated:Patient Re-evaluated prior to induction Oxygen Delivery Method: Circle system utilized Preoxygenation: Pre-oxygenation with 100% oxygen Induction Type: IV induction and Rapid sequence Laryngoscope Size: Glidescope and 4 Grade View: Grade I Tube type: Oral Tube size: 7.5 mm Number of attempts: 1 Airway Equipment and Method: Stylet, Rigid stylet and Video-laryngoscopy Placement Confirmation: ETT inserted through vocal cords under direct vision, positive ETCO2 and breath sounds checked- equal and bilateral Secured at: 24 cm Tube secured with: Tape Dental Injury: Teeth and Oropharynx as per pre-operative assessment  Comments: Glidescope intubation, cspine neutral for induction and intubation.

## 2020-12-13 NOTE — ED Provider Notes (Signed)
[facility] EMERGENCY DEPARTMENT Provider Note   CSN: [id number] Arrival date & time: 12/13/20  1709     History Chief Complaint  Patient presents with   Leg Injury    Tom Baker is a 65 y.o. male.  Patient presents via EMS s/p fall. Patient indicates he fell when trying to catch dog that he had let loose from leash, falling onto right hip. Pain post fall, acute onset, mod-severe, constant, dull, non radiating. Unable to stand post fall. Denies prior hip injury or surgery. Denies any other pain or injury. No head  injury or headache. No neck or back pain. No cp or sob. No abd pain or nv. No extremity numbness or weakness. No anticoagulant use.   The history is provided by the patient, the EMS personnel and the spouse.      Past Medical History:  Diagnosis Date   BPH (benign prostatic hyperplasia)    Complication of anesthesia    hard to urinate after surgery ankle surgery 2005 [facility]   Diabetes mellitus type 1 (HCC)    GERD (gastroesophageal reflux disease)    Glaucoma    left eye   Hyperlipidemia    Hypertension    Hypothyroid    Pericardial effusion 2017   Pneumonia 2017   Post-nasal drip 08/02/2016   Runny nose    occ at times pe pt   Wears glasses     Patient Active Problem List   Diagnosis Date Noted   Post-nasal drip 08/02/2016   Atrial flutter (HCC)    Syncope 12/12/2015   Diabetes mellitus type 1 (HCC)    Hyperlipidemia    Hypertension 10/24/2010   Hypothyroidism 10/24/2010    Past Surgical History:  Procedure Laterality Date   ANKLE SURGERY Right 2010   [facility] plate and 6 screws still in   CHOLECYSTECTOMY  1995   laparoscopic   CYSTOSCOPY WITH INSERTION OF UROLIFT N/A 09/16/2020   Procedure: CYSTOSCOPY WITH INSERTION OF UROLIFT; URETHRAL DILATION;  Surgeon: Jerilee Field, MD;  Location: [facility] [place];  Service: Urology;  Laterality: N/A;   EYE SURGERY Left 2003   lens replacement for cataract   KNEE SURGERY  Right 1988   arthroscopic   radioactive iodine to thryoid  2000   TOE SURGERY Left 1972   great toe   VITRECTOMY  1999       Family History  Problem Relation Age of Onset   Bone cancer Father     Social History   Tobacco Use   Smoking status: Never   Smokeless tobacco: Never  Vaping Use   Vaping Use: Never used  Substance Use Topics   Alcohol use: No    Alcohol/week: 0.0 standard drinks    Comment: last used 1992   Drug use: No    Home Medications Prior to Admission medications   Medication Sig Start Date End Date Taking? Authorizing Provider  amLODipine (NORVASC) 5 MG tablet Take 1 tablet (5 mg total) by mouth daily. 12/17/15   Alison Murray, MD  aspirin EC 81 MG tablet Take 81 mg by mouth at bedtime.    [provider]  calcium carbonate (TUMS - DOSED IN MG ELEMENTAL CALCIUM) 500 MG chewable tablet Chew 1 tablet by mouth 2 (two) times daily as needed for indigestion or heartburn.    [provider]  Insulin Infusion Pump DEVI by Does not apply route. Novolog Insulin    [provider]  Lancets (ONETOUCH ULTRASOFT) lancets TEST six  times a day 10/10/15   [provider]  levothyroxine (SYNTHROID, LEVOTHROID) 125 MCG tablet Take 125 mcg by mouth daily.    [provider]  losartan (COZAAR) 100 MG tablet Take 100 mg by mouth daily.    [provider]  NOVOLOG 100 UNIT/ML injection USE 50 UNITS IN INSULIN PUMP ONCE DAILY 12/10/15   [provider]  Polyethylene Glycol 3350 (MIRALAX PO) Take 1 tablet by mouth at bedtime. 1 capful    [provider]  rosuvastatin (CRESTOR) 5 MG tablet TAKE 1 TABLET(5 MG) BY MOUTH DAILY AT 6 PM 11/18/20   Chilton Si, MD  tadalafil (CIALIS) 5 MG tablet Take 5 mg by mouth daily.    [provider]  tamsulosin (FLOMAX) 0.4 MG CAPS capsule Take 0.4 mg by mouth at bedtime.    [provider]  tetrahydrozoline-zinc (VISINE-AC) 0.05-0.25 % ophthalmic solution 2  drops 3 (three) times daily as needed.    [provider]  timolol (BETIMOL) 0.5 % ophthalmic solution Place 1 drop into the left eye 2 (two) times daily.    [provider]    Allergies    Patient has no known allergies.  Review of Systems   Review of Systems  Constitutional:  Negative for fever.  HENT:  Negative for sore throat.   Eyes:  Negative for redness.  Respiratory:  Negative for shortness of breath.   Cardiovascular:  Negative for chest pain.  Gastrointestinal:  Negative for abdominal pain and vomiting.  Genitourinary:  Negative for flank pain.  Musculoskeletal:  Negative for back pain and neck pain.  Skin:  Negative for wound.  Neurological:  Negative for weakness, numbness and headaches.  Hematological:  Does not bruise/bleed easily.  Psychiatric/Behavioral:  Negative for confusion.    Physical Exam Updated Vital Signs Ht 1.905 m (6\' 3" )   Wt 85.3 kg   SpO2 98%   BMI 23.50 kg/m   Physical Exam Vitals and nursing note reviewed.  Constitutional:      Appearance: Normal appearance. He is well-developed.  HENT:     Head: Atraumatic.     Nose: Nose normal.     Mouth/Throat:     Mouth: Mucous membranes are moist.     Pharynx: Oropharynx is clear.  Eyes:     General: No scleral icterus.    Conjunctiva/sclera: Conjunctivae normal.     Pupils: Pupils are equal, round, and reactive to light.  Neck:     Trachea: No tracheal deviation.  Cardiovascular:     Rate and Rhythm: Normal rate and regular rhythm.     Pulses: Normal pulses.     Heart sounds: Normal heart sounds. No murmur heard.   No friction rub. No gallop.  Pulmonary:     Effort: Pulmonary effort is normal. No accessory muscle usage or respiratory distress.     Breath sounds: Normal breath sounds.  Chest:     Chest wall: No tenderness.  Abdominal:     General: Bowel sounds are normal. There is no distension.     Palpations: Abdomen is soft.     Tenderness: There is no abdominal  tenderness. There is no guarding.  Genitourinary:    Comments: No cva tenderness. Musculoskeletal:        General: No swelling.     Cervical back: Normal range of motion and neck supple. No rigidity or tenderness.     Comments: CTLS spine, non tender, aligned, no step off. Deformity right hip, with shortening RLE. Distal  pulses palp. No other focal bony tenderness on bilateral extremity exam.   Skin:    General: Skin is warm and dry.     Findings: No rash.  Neurological:     Mental Status: He is alert.     Comments: Alert, speech clear. GCS 15. Motor/sens grossly intact bil. RLE motor/sens is intact.   Psychiatric:        Mood and Affect: Mood normal.    ED Results / Procedures / Treatments   Labs (all labs ordered are listed, but only abnormal results are displayed) Results for orders placed or performed during the hospital encounter of 12/13/20  CBC  Result Value Ref Range   WBC 6.8 4.0 - 10.5 K/uL   RBC 4.13 (L) 4.22 - 5.81 MIL/uL   Hemoglobin 12.1 (L) 13.0 - 17.0 g/dL   HCT 28.3 (L) 66.2 - 94.7 %   MCV 87.2 80.0 - 100.0 fL   MCH 29.3 26.0 - 34.0 pg   MCHC 33.6 30.0 - 36.0 g/dL   RDW 65.4 65.0 - 35.4 %   Platelets 218 150 - 400 K/uL   nRBC 0.0 0.0 - 0.2 %   DG Chest Port 1 View  Result Date: 12/13/2020 CLINICAL DATA:  Right hip fracture. EXAM: PORTABLE CHEST 1 VIEW COMPARISON:  CTA chest dated December 13, 2015. Chest x-ray dated December 12, 2015. FINDINGS: The heart size and mediastinal contours are within normal limits. Both lungs are clear. The visualized skeletal structures are unremarkable. IMPRESSION: No active disease. Electronically Signed   By: Obie Dredge M.D.   On: 12/13/2020 18:01   DG Hip Port [place] or [place] Pelvis 1 View Right  Result Date: 12/13/2020 CLINICAL DATA:  Fall. EXAM: DG HIP (WITH OR WITHOUT PELVIS) 1V PORT RIGHT COMPARISON:  None. FINDINGS: Acute displaced comminuted right subtrochanteric femur fracture. Mild bilateral hip osteoarthritis. The pubic  symphysis and sacroiliac joints are intact. IMPRESSION: 1. Acute displaced right subtrochanteric femur fracture. Electronically Signed   By: Obie Dredge M.D.   On: 12/13/2020 18:00     EKG None  Radiology DG Chest Port 1 View  Result Date: 12/13/2020 CLINICAL DATA:  Right hip fracture. EXAM: PORTABLE CHEST 1 VIEW COMPARISON:  CTA chest dated December 13, 2015. Chest x-ray dated December 12, 2015. FINDINGS: The heart size and mediastinal contours are within normal limits. Both lungs are clear. The visualized skeletal structures are unremarkable. IMPRESSION: No active disease. Electronically Signed   By: Obie Dredge M.D.   On: 12/13/2020 18:01   DG Hip Port [place] or [place] Pelvis 1 View Right  Result Date: 12/13/2020 CLINICAL DATA:  Fall. EXAM: DG HIP (WITH OR WITHOUT PELVIS) 1V PORT RIGHT COMPARISON:  None. FINDINGS: Acute displaced comminuted right subtrochanteric femur fracture. Mild bilateral hip osteoarthritis. The pubic symphysis and sacroiliac joints are intact. IMPRESSION: 1. Acute displaced right subtrochanteric femur fracture. Electronically Signed   By: Obie Dredge M.D.   On: 12/13/2020 18:00    Procedures Procedures   Medications Ordered in ED Medications  ondansetron (ZOFRAN) injection 4 mg (4 mg Intravenous Given 12/13/20 1805)  HYDROmorphone (DILAUDID) injection 1 mg (1 mg Intravenous Given 12/13/20 1805)    ED Course  I have reviewed the triage vital signs and the nursing notes.  Pertinent labs & imaging results that were available during my care of the patient were reviewed by me and considered in my medical decision making (see chart for details).    MDM Rules/Calculators/A&P  Iv ns. Stat labs and imaging. Dilaudid iv. Zofran iv.   Reviewed nursing notes and prior charts for additional history.   Labs reviewed/interpreted by me -wbc normal. Hgb 12.   Xr reviewed/interpreted by me - right subtroch fx. Orthopedics consulted. Discussed w Dr  Carola FrostHandy - he indicates possible OR later today (pt last ate/drank around 2:15 pm today).   Hospitalists consulted for admission.  Recheck, pain improved. Distal pulses palp. No numbness/weakness.      Final Clinical Impression(s) / ED Diagnoses Final diagnoses:  None    Rx / DC Orders ED Discharge Orders     None        Cathren LaineSteinl, Kalaya Infantino, MD 12/13/20 1824

## 2020-12-13 NOTE — Anesthesia Preprocedure Evaluation (Addendum)
Anesthesia Evaluation  Patient identified by MRN, date of birth, ID band Patient awake    Reviewed: Allergy & Precautions, NPO status , Patient's Chart, lab work & pertinent test results  History of Anesthesia Complications Negative for: history of anesthetic complications (urinary retention)  Airway Mallampati: II  TM Distance: >3 FB Neck ROM: Full    Dental  (+) Dental Advisory Given   Pulmonary neg pulmonary ROS,  12/13/2020 SARS coronavirus NEG   breath sounds clear to auscultation       Cardiovascular hypertension, Pt. on medications + dysrhythmias Atrial Fibrillation  Rhythm:Regular Rate:Normal  '17 ECHO: EF 55-60%, Grade 1 DD, no significant valvular abnormalities '17 myoview: LV EF is hyperdynamic (>65%). Nuclear stress EF: 69%. Persistent diffuse ST elevation consistent with early repolarization.Marland Kitchen No change from baseline EKG, The study is normal.     Neuro/Psych glaucoma    GI/Hepatic Neg liver ROS, GERD  Controlled,  Endo/Other  diabetes (glu 156), Insulin DependentHypothyroidism   Renal/GU Renal InsufficiencyRenal disease   BPH    Musculoskeletal   Abdominal   Peds  Hematology negative hematology ROS (+) Hb 12.1   Anesthesia Other Findings   Reproductive/Obstetrics                           Anesthesia Physical Anesthesia Plan  ASA: 3 and emergent  Anesthesia Plan: General   Post-op Pain Management:    Induction: Intravenous and Rapid sequence  PONV Risk Score and Plan: 2 and Ondansetron and Dexamethasone  Airway Management Planned: Oral ETT and Video Laryngoscope Planned  Additional Equipment: None  Intra-op Plan:   Post-operative Plan: Extubation in OR  Informed Consent: I have reviewed the patients History and Physical, chart, labs and discussed the procedure including the risks, benefits and alternatives for the proposed anesthesia with the patient or  authorized representative who has indicated his/her understanding and acceptance.     Dental advisory given  Plan Discussed with: CRNA and Surgeon  Anesthesia Plan Comments: (Dr. Carola Frost wishes to proceed emergently given the severity of the patient's femoral shaft fracture, NPO since 14:15 today. )       Anesthesia Quick Evaluation

## 2020-12-14 ENCOUNTER — Inpatient Hospital Stay (HOSPITAL_COMMUNITY): Payer: BC Managed Care – PPO

## 2020-12-14 DIAGNOSIS — E039 Hypothyroidism, unspecified: Secondary | ICD-10-CM

## 2020-12-14 DIAGNOSIS — E1065 Type 1 diabetes mellitus with hyperglycemia: Secondary | ICD-10-CM

## 2020-12-14 DIAGNOSIS — E785 Hyperlipidemia, unspecified: Secondary | ICD-10-CM

## 2020-12-14 DIAGNOSIS — I10 Essential (primary) hypertension: Secondary | ICD-10-CM

## 2020-12-14 DIAGNOSIS — N179 Acute kidney failure, unspecified: Secondary | ICD-10-CM

## 2020-12-14 LAB — CBC
HCT: 26.3 % — ABNORMAL LOW (ref 39.0–52.0)
Hemoglobin: 8.5 g/dL — ABNORMAL LOW (ref 13.0–17.0)
MCH: 28.5 pg (ref 26.0–34.0)
MCHC: 32.3 g/dL (ref 30.0–36.0)
MCV: 88.3 fL (ref 80.0–100.0)
Platelets: 175 10*3/uL (ref 150–400)
RBC: 2.98 MIL/uL — ABNORMAL LOW (ref 4.22–5.81)
RDW: 13.6 % (ref 11.5–15.5)
WBC: 8.9 10*3/uL (ref 4.0–10.5)
nRBC: 0 % (ref 0.0–0.2)

## 2020-12-14 LAB — BASIC METABOLIC PANEL
Anion gap: 8 (ref 5–15)
BUN: 22 mg/dL (ref 8–23)
CO2: 24 mmol/L (ref 22–32)
Calcium: 8.2 mg/dL — ABNORMAL LOW (ref 8.9–10.3)
Chloride: 105 mmol/L (ref 98–111)
Creatinine, Ser: 1.59 mg/dL — ABNORMAL HIGH (ref 0.61–1.24)
GFR, Estimated: 48 mL/min — ABNORMAL LOW (ref >60)
Glucose, Bld: 242 mg/dL — ABNORMAL HIGH (ref 70–99)
Potassium: 4.3 mmol/L (ref 3.5–5.1)
Sodium: 137 mmol/L (ref 135–145)

## 2020-12-14 LAB — GLUCOSE, CAPILLARY
Glucose-Capillary: 151 mg/dL — ABNORMAL HIGH (ref 70–99)
Glucose-Capillary: 151 mg/dL — ABNORMAL HIGH (ref 70–99)
Glucose-Capillary: 166 mg/dL — ABNORMAL HIGH (ref 70–99)
Glucose-Capillary: 225 mg/dL — ABNORMAL HIGH (ref 70–99)
Glucose-Capillary: 231 mg/dL — ABNORMAL HIGH (ref 70–99)
Glucose-Capillary: 249 mg/dL — ABNORMAL HIGH (ref 70–99)
Glucose-Capillary: 255 mg/dL — ABNORMAL HIGH (ref 70–99)
Glucose-Capillary: 255 mg/dL — ABNORMAL HIGH (ref 70–99)
Glucose-Capillary: 261 mg/dL — ABNORMAL HIGH (ref 70–99)
Glucose-Capillary: 263 mg/dL — ABNORMAL HIGH (ref 70–99)

## 2020-12-14 LAB — POCT I-STAT, CHEM 8
BUN: 22 mg/dL (ref 8–23)
Calcium, Ion: 1.19 mmol/L (ref 1.15–1.40)
Chloride: 104 mmol/L (ref 98–111)
Creatinine, Ser: 1.2 mg/dL (ref 0.61–1.24)
Glucose, Bld: 201 mg/dL — ABNORMAL HIGH (ref 70–99)
HCT: 30 % — ABNORMAL LOW (ref 39.0–52.0)
Hemoglobin: 10.2 g/dL — ABNORMAL LOW (ref 13.0–17.0)
Potassium: 4.4 mmol/L (ref 3.5–5.1)
Sodium: 141 mmol/L (ref 135–145)
TCO2: 21 mmol/L — ABNORMAL LOW (ref 22–32)

## 2020-12-14 LAB — HIV ANTIBODY (ROUTINE TESTING W REFLEX): HIV Screen 4th Generation wRfx: NONREACTIVE

## 2020-12-14 LAB — PROTIME-INR
INR: 1.3 — ABNORMAL HIGH (ref 0.8–1.2)
Prothrombin Time: 15.7 seconds — ABNORMAL HIGH (ref 11.4–15.2)

## 2020-12-14 LAB — VITAMIN D 25 HYDROXY (VIT D DEFICIENCY, FRACTURES): Vit D, 25-Hydroxy: 8.69 ng/mL — ABNORMAL LOW (ref 30–100)

## 2020-12-14 LAB — TSH: TSH: 0.513 u[IU]/mL (ref 0.350–4.500)

## 2020-12-14 MED ORDER — OXYCODONE HCL 5 MG PO TABS
5.0000 mg | ORAL_TABLET | Freq: Once | ORAL | Status: DC | PRN
Start: 2020-12-14 — End: 2020-12-14

## 2020-12-14 MED ORDER — MIDAZOLAM HCL 2 MG/2ML IJ SOLN: 0.5000 mg | Freq: Once | INTRAMUSCULAR | Status: DC | PRN

## 2020-12-14 MED ORDER — SUGAMMADEX SODIUM 200 MG/2ML IV SOLN
INTRAVENOUS | Status: DC | PRN
  Administered 2020-12-14: 200 mg via INTRAVENOUS

## 2020-12-14 MED ORDER — METOCLOPRAMIDE HCL 5 MG PO TABS: 5.0000 mg | ORAL_TABLET | Freq: Three times a day (TID) | ORAL | Status: DC | PRN

## 2020-12-14 MED ORDER — AMLODIPINE BESYLATE 5 MG PO TABS
5.0000 mg | ORAL_TABLET | Freq: Every day | ORAL | Status: DC
  Administered 2020-12-14 – 2020-12-18 (×5): 5 mg via ORAL
  Filled 2020-12-14 (×5): qty 1

## 2020-12-14 MED ORDER — TRANEXAMIC ACID-NACL 1000-0.7 MG/100ML-% IV SOLN
1000.0000 mg | Freq: Once | INTRAVENOUS | Status: AC
  Administered 2020-12-14: 1000 mg via INTRAVENOUS
  Filled 2020-12-14: qty 100

## 2020-12-14 MED ORDER — ENOXAPARIN SODIUM 40 MG/0.4ML IJ SOSY
40.0000 mg | PREFILLED_SYRINGE | INTRAMUSCULAR | Status: DC
  Administered 2020-12-15 – 2020-12-18 (×3): 40 mg via SUBCUTANEOUS
  Filled 2020-12-14 (×4): qty 0.4

## 2020-12-14 MED ORDER — METHOCARBAMOL 1000 MG/10ML IJ SOLN
500.0000 mg | Freq: Three times a day (TID) | INTRAVENOUS | Status: DC
  Filled 2020-12-14 (×16): qty 5

## 2020-12-14 MED ORDER — LOSARTAN POTASSIUM 50 MG PO TABS
100.0000 mg | ORAL_TABLET | Freq: Every day | ORAL | Status: DC
  Administered 2020-12-14 – 2020-12-18 (×5): 100 mg via ORAL
  Filled 2020-12-14 (×5): qty 2

## 2020-12-14 MED ORDER — LEVOTHYROXINE SODIUM 25 MCG PO TABS
125.0000 ug | ORAL_TABLET | Freq: Every day | ORAL | Status: DC
  Administered 2020-12-14 – 2020-12-18 (×5): 125 ug via ORAL
  Filled 2020-12-14 (×5): qty 1

## 2020-12-14 MED ORDER — ASPIRIN EC 81 MG PO TBEC
81.0000 mg | DELAYED_RELEASE_TABLET | Freq: Every day | ORAL | Status: DC
  Administered 2020-12-14 – 2020-12-17 (×4): 81 mg via ORAL
  Filled 2020-12-14 (×4): qty 1

## 2020-12-14 MED ORDER — MORPHINE SULFATE (PF) 2 MG/ML IV SOLN: 0.5000 mg | INTRAVENOUS | Status: DC | PRN

## 2020-12-14 MED ORDER — ROSUVASTATIN CALCIUM 5 MG PO TABS
5.0000 mg | ORAL_TABLET | Freq: Every day | ORAL | Status: DC
  Administered 2020-12-14 – 2020-12-17 (×4): 5 mg via ORAL
  Filled 2020-12-14 (×4): qty 1

## 2020-12-14 MED ORDER — NAPHAZOLINE-GLYCERIN 0.012-0.25 % OP SOLN: 2.0000 [drp] | Freq: Four times a day (QID) | OPHTHALMIC | Status: DC | PRN

## 2020-12-14 MED ORDER — INSULIN PUMP
Freq: Three times a day (TID) | SUBCUTANEOUS | Status: DC
  Filled 2020-12-14: qty 1

## 2020-12-14 MED ORDER — METOCLOPRAMIDE HCL 5 MG/ML IJ SOLN: 5.0000 mg | Freq: Three times a day (TID) | INTRAMUSCULAR | Status: DC | PRN

## 2020-12-14 MED ORDER — MENTHOL 3 MG MT LOZG: 1.0000 | LOZENGE | OROMUCOSAL | Status: DC | PRN

## 2020-12-14 MED ORDER — METHOCARBAMOL 750 MG PO TABS
750.0000 mg | ORAL_TABLET | Freq: Three times a day (TID) | ORAL | Status: DC
  Administered 2020-12-14 – 2020-12-18 (×14): 750 mg via ORAL
  Filled 2020-12-14 (×14): qty 1

## 2020-12-14 MED ORDER — MEPERIDINE HCL 25 MG/ML IJ SOLN
6.2500 mg | INTRAMUSCULAR | Status: DC | PRN
Start: 2020-12-14 — End: 2020-12-14

## 2020-12-14 MED ORDER — ONDANSETRON HCL 4 MG/2ML IJ SOLN
INTRAMUSCULAR | Status: DC | PRN
  Administered 2020-12-14: 4 mg via INTRAVENOUS

## 2020-12-14 MED ORDER — HYDROCODONE-ACETAMINOPHEN 5-325 MG PO TABS: 1.0000 | ORAL_TABLET | Freq: Four times a day (QID) | ORAL | Status: DC | PRN

## 2020-12-14 MED ORDER — INSULIN INFUSION PUMP DEVI: 50.0000 [IU] | Status: DC

## 2020-12-14 MED ORDER — ONDANSETRON HCL 4 MG PO TABS: 4.0000 mg | ORAL_TABLET | Freq: Four times a day (QID) | ORAL | Status: DC | PRN

## 2020-12-14 MED ORDER — TIMOLOL MALEATE 0.5 % OP SOLN
1.0000 [drp] | Freq: Two times a day (BID) | OPHTHALMIC | Status: DC
  Administered 2020-12-14 – 2020-12-18 (×10): 1 [drp] via OPHTHALMIC
  Filled 2020-12-14: qty 5

## 2020-12-14 MED ORDER — PROMETHAZINE HCL 25 MG/ML IJ SOLN: 6.2500 mg | INTRAMUSCULAR | Status: DC | PRN

## 2020-12-14 MED ORDER — ONDANSETRON HCL 4 MG/2ML IJ SOLN: 4.0000 mg | Freq: Four times a day (QID) | INTRAMUSCULAR | Status: DC | PRN

## 2020-12-14 MED ORDER — PHENOL 1.4 % MT LIQD: 1.0000 | OROMUCOSAL | Status: DC | PRN

## 2020-12-14 MED ORDER — CEFAZOLIN SODIUM-DEXTROSE 2-4 GM/100ML-% IV SOLN
2.0000 g | Freq: Four times a day (QID) | INTRAVENOUS | Status: AC
  Administered 2020-12-14 (×2): 2 g via INTRAVENOUS
  Filled 2020-12-14 (×2): qty 100

## 2020-12-14 MED ORDER — ACETAMINOPHEN 325 MG PO TABS
325.0000 mg | ORAL_TABLET | Freq: Four times a day (QID) | ORAL | Status: DC | PRN
Start: 2020-12-15 — End: 2020-12-18

## 2020-12-14 MED ORDER — OXYCODONE HCL 5 MG/5ML PO SOLN: 5.0000 mg | Freq: Once | ORAL | Status: DC | PRN

## 2020-12-14 MED ORDER — HYDROMORPHONE HCL 1 MG/ML IJ SOLN: 0.2500 mg | INTRAMUSCULAR | Status: DC | PRN

## 2020-12-14 MED ORDER — ACETAMINOPHEN 500 MG PO TABS
500.0000 mg | ORAL_TABLET | Freq: Three times a day (TID) | ORAL | Status: DC
  Administered 2020-12-14 – 2020-12-18 (×14): 500 mg via ORAL
  Filled 2020-12-14 (×14): qty 1

## 2020-12-14 MED ORDER — HYDROCODONE-ACETAMINOPHEN 7.5-325 MG PO TABS: 1.0000 | ORAL_TABLET | Freq: Four times a day (QID) | ORAL | Status: DC | PRN

## 2020-12-14 MED ORDER — DOCUSATE SODIUM 100 MG PO CAPS
100.0000 mg | ORAL_CAPSULE | Freq: Two times a day (BID) | ORAL | Status: DC
  Administered 2020-12-14 – 2020-12-18 (×9): 100 mg via ORAL
  Filled 2020-12-14 (×9): qty 1

## 2020-12-14 MED ORDER — TADALAFIL 5 MG PO TABS
5.0000 mg | ORAL_TABLET | Freq: Every day | ORAL | Status: DC
  Administered 2020-12-14 – 2020-12-17 (×4): 5 mg via ORAL
  Filled 2020-12-14 (×6): qty 1

## 2020-12-14 NOTE — Op Note (Signed)
12:27 AM  PATIENT:  Tom Baker  65 y.o. male  PRE-OPERATIVE DIAGNOSIS: RIGHT PROXIMAL FEMORAL SHAFT FRACTURE  POST-OPERATIVE DIAGNOSIS:  RIGHT PROXIMAL FEMORAL SHAFT FRACTURE  PROCEDURE:   ANTEGRADE INTRAMEDULLARY NAILING OF THE RIGHT FEMUR with SYNTHES RECON FRN 11 X 420 MM, statically locked  SURGEON:  Surgeon(s) and Role:    Myrene Galas, MD - Primary  PHYSICIAN ASSISTANT: 1. Montez Morita, PA-C  ANESTHESIA:   general  EBL:  300 mL   BLOOD ADMINISTERED:none  DRAINS: none   LOCAL MEDICATIONS USED:  NONE  SPECIMEN:  No Specimen  DISPOSITION OF SPECIMEN:  N/A  COUNTS:  YES  DICTATION: .Note written in EPIC  PLAN OF CARE: Admit to inpatient   PATIENT DISPOSITION:  PACU - hemodynamically stable.   Delay start of Pharmacological VTE agent (>24hrs) due to surgical blood loss or risk of bleeding: no  BRIEF SUMMARY AND INDICATION OF PROCEDURE:  Tom Baker is a 65 y.o. year- old with multiple medical problems who sustained a ground level fall with proximal femoral shaft fracture that spiraled proximally.  I discussed with the patient and his wife the risks and benefits of surgical treatment including the potential for malunion, nonunion, symptomatic hardware, heart attack, stroke, neurovascular injury, bleeding, and others.  After acknowledging these risks, consent was provided to proceed.  BRIEF SUMMARY OF PROCEDURE:  The patient was taken to the operating room where general anesthesia was induced, and then was positioned supine on the radiolucent table with a bump under the operative hip. Traction was used to maintain reduction while standard prep with chlorhexidene wash and then betadine scrub and paint was performed. After sterile drapes and time-out, a long instrument was used to identify the appropriate incision placement under C-arm on both AP and lateral images.  I made a 8 cm incision over the proximal shaft extending up to the subtrochanteric area. My assistant  applied attraction and derotated the fracture to dial in the reduction, but it could not be adequately produced and maintained with a colinear clamp although we were able to gain a near anatomic reduction. This was followed by careful passage of two Arthrex fibertape tensioned loops which further improved reduction and compressed the fracture. A 3 cm incision was made proximal to the greater trochanter.  The curved cannulated awl was inserted medial to the trochanter and then the starting guidewire advanced into the proximal femur.  This was checked on AP and lateral views.  The starting reamer was engaged while protecting the soft tissue with a sleeve.   The curved ball-tipped guidewire was then inserted, making sure it safely crossed the fracture site on AP and LAT views and then stayed safely posterior in the distal femur. We sequentially reamed up to 12.5 mm, making sure the fracture remained reduced during reaming, and inserted a 11 x 420 mm nail to the appropriate depth.  The guidewire for the lag screws was then inserted in appropriate anteversion to make sure it was in a center-center position within the femoral head.  This was measured and the lag screws placed sequentially with excellent purchase and position as checked on both views.  I turned attention distally where two static locking bolts were placed using perfect circle technique. The far cortex of the slotted hole cracked slightly because of the screw trajectory so this bolt was removed and the more distal option used. Final images were obtained showing excellent reduction, with appropriate hardware placement, trajectory and length.  Montez Morita, PA-C assisted throughout  this technically challenging case with establishment of reduction and maintenance of reduction, as well as wound closure. We irrigated and closed the wound in standard layered fashion using running 0 and figure-of-eight 0 Vicryl for the vastus lateralis, interrupted figure-of-  eight #1 Vicryl for the tensor and then 2-0 Vicryl and 2-0 nylon for the skin.  Sterile gently compressive dressing was applied.  The patient was awakened from anesthesia and transported to the PACU in stable condition.   PROGNOSIS:  Tom Baker has increased rate of delayed or nonunion and infection from his poorly controlled diabetes but hopeful this will be mitigated by the reduction and construct we were able to obtain. Immediate range of motion of the knee, hip and ankle are encouraged and patient may progress to weightbearing as tolerated after a few weeks of TDWB. Formal pharmacologic DVT prophylaxis has been ordered. A two day stay prior to discharge would be anticipated depending on progression. Office follow up in 2 weeks for removal of sutures and new x-rays. 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    [name]. Carola Frost, M.D.

## 2020-12-14 NOTE — TOC CAGE-AID Note (Signed)
Transition of Care [facility]) - CAGE-AID Screening   Patient Details  Name: Tom Baker MRN: [id number] Date of Birth: 1956/04/11  Transition of Care [facility]) CM/SW Contact:    Janora Norlander, RN Phone Number: [phone] 12/14/2020, 5:17 PM   Clinical Narrative: Pt fell trying to catch dog that was let loose from leash.  R hip injury.  Pt denies alcohol and drug use.   CAGE-AID Screening:    Have You Ever Felt You Ought to Cut Down on Your Drinking or Drug Use?: No Have People Annoyed You By Critizing Your Drinking Or Drug Use?: No Have You Felt Bad Or Guilty About Your Drinking Or Drug Use?: No Have You Ever Had a Drink or Used Drugs First Thing In The Morning to Steady Your Nerves or to Get Rid of a Hangover?: No CAGE-AID Score: 0  Substance Abuse Education Offered: No

## 2020-12-14 NOTE — Progress Notes (Signed)
Patient placed back on personal insulin pump and Endo tool stopped per Dr. Jean Rosenthal, anesthesia.

## 2020-12-14 NOTE — Progress Notes (Signed)
Received patient from PACU awake,alert/orientedx4 and able to verbalize needs. VSS; NAD noted; respirations easy/even on room air. Movement/sensation to all extremities noted. Dressingsx3 to RLE c/d/I; drainage marked. Continuous pulse ox connected at this time. SCD placed to LLE. Oriented to room and floor and use of call light. Whiteboard updated. All safety measures in place and personal belongings within reach.

## 2020-12-14 NOTE — Progress Notes (Signed)
Occupational Therapy Evaluation Patient Details Name: Tom Baker MRN: [id number] DOB: [birth year] Today's Date: 12/14/2020    History of Present Illness Tramane Gorum is a 65 y.o. male who presented to the ER 6/11 status post fall onto right hip.  Now s/p INTRAMEDULLARY (IM) NAIL INTERTROCHANTRIC (Right: Hip) 6/11. He had taken his dog out to go to the bathroom, was standing on the porch talking to his neighbor when he fell onto his right hip. Pt with medical history significant of type 1 diabetes on insulin pump, hypertension, hypothyroidism, hyperlipidemia and BPH.   Clinical Impression   Vicente was evaluated s/p the above R hip IM nailing. PTA pt reports being indep in all ADL/IADLs including working and driving; he lives in a one level home with 3 STE with his wife who plans to be with pt 24/7 upon d/c. Upon evaluation pt was min guard for bed mobility, and min A for stand pivot with rw from bed>chair for balance in standing. Pt maintained WB status well. Upper body ADLs are at set up level while sitting and lower ADLs are mod/max for managing over bilat hips in standing with BUE supported on rw. Pt reported slight dizziness after transferring to the chair, BP while reclined in chair was 105/56; symptoms resolved quickly; HR at rest 100, HR after functional activity 120-125; O2 sat 98-100 on RA: Attending MD notified of vitals per order. Pt benefits from continued OT acutely to progress indep in Adls and mobility. Recommend /dc to home with home health OT.    Follow Up Recommendations  Home health OT;Supervision - Intermittent    Equipment Recommendations  Other (comment) (rw)       Precautions / Restrictions Precautions Precautions: Fall Restrictions Weight Bearing Restrictions: Yes RLE Weight Bearing: Partial weight bearing RLE Partial Weight Bearing Percentage or Pounds: 25% Other Position/Activity Restrictions: Ortho note states TDWB, orders state PWB; for conservative meausres pt  maintains TDWB this session      Mobility Bed Mobility Overal bed mobility: Needs Assistance Bed Mobility: Supine to Sit     Supine to sit: Min guard;HOB elevated (incrased time)     General bed mobility comments: increased time and vc for RLE management    Transfers Overall transfer level: Needs assistance Equipment used: Rolling walker (2 wheeled) Transfers: Sit to/from [employer] Sit to Stand: Min assist;From elevated surface Stand pivot transfers: Min assist       General transfer comment: Min A for balance and steadying    Balance Overall balance assessment: Needs assistance Sitting-balance support: No upper extremity supported Sitting balance-Leahy Scale: Good     Standing balance support: Bilateral upper extremity supported Standing balance-Leahy Scale: Poor           ADL either performed or assessed with clinical judgement   ADL Overall ADL's : Needs assistance/impaired Eating/Feeding: Independent;Sitting   Grooming: Wash/dry hands;Wash/dry face;Oral care;Applying deodorant;Brushing hair;Set up;Sitting   Upper Body Bathing: Set up;Sitting   Lower Body Bathing: Sitting/lateral leans;Minimal assistance   Upper Body Dressing : Set up;Sitting   Lower Body Dressing: Bed level;Sit to/from stand;Maximal assistance   Toilet Transfer: Stand-pivot;BSC;RW;Cueing for safety;Minimal assistance   Toileting- Clothing Manipulation and Hygiene: Sit to/from stand;Maximal assistance       Functional mobility during ADLs: Minimal assistance;Rolling walker;Cueing for safety;Cueing for sequencing General ADL Comments: educated on compensatory techniques fro LB bathing and dressing     Vision Baseline Vision/History: Retinopathy;Glaucoma;Wears glasses Wears Glasses: At all times Patient Visual Report: No change from baseline Vision  Assessment?: Vision impaired- to be further tested in functional context      Pertinent Vitals/Pain Pain  Assessment: 0-10 Pain Score: 1  Pain Location: R hip Pain Descriptors / Indicators: Discomfort;Operative site guarding;Guarding;Grimacing Pain Intervention(s): Limited activity within patient's tolerance;Monitored during session     Hand Dominance Left   Extremity/Trunk Assessment Upper Extremity Assessment Upper Extremity Assessment: RUE deficits/detail;LUE deficits/detail RUE Deficits / Details: peripheral neuropathy at baseline RUE Sensation: decreased light touch;history of peripheral neuropathy LUE Deficits / Details: peripheral neuropathy at baseline LUE Sensation: decreased light touch;history of peripheral neuropathy   Lower Extremity Assessment Lower Extremity Assessment: RLE deficits/detail RLE Deficits / Details: s/p R hip fx & IM nail placement RLE Sensation:  ("tingling")   Cervical / Trunk Assessment Cervical / Trunk Assessment: Normal   Communication Communication Communication: No difficulties   Cognition Arousal/Alertness: Awake/alert Behavior During Therapy: WFL for tasks assessed/performed Overall Cognitive Status: Within Functional Limits for tasks assessed                 General Comments  pt dressings noted with blood, RN aware; O2 98-100 on RA, HR at rest 100, HR after functional activity 120-125; BP sitting in chair 105/56; RN aware of all vitals, Attending MD notified via secure chat.     Home Living Family/patient expects to be discharged to:: Private residence Living Arrangements: Spouse/significant other Available Help at Discharge: Available PRN/intermittently Type of Home: House Home Access: Stairs to enter Secretary/administrator of Steps: 3 Entrance Stairs-Rails: None Home Layout: One level     Bathroom Shower/Tub: IT trainer: Handicapped height Bathroom Accessibility: Yes How Accessible: Accessible via walker     Additional Comments: Wife works as a Social worker, she plans to move her schedule  around to be with pt 24/7 upon d/c      Prior Functioning/Environment Level of Independence: Independent        Comments: Works in Consulting civil engineer, drives        OT Problem List: Decreased range of motion;Decreased activity tolerance;Impaired balance (sitting and/or standing);Decreased safety awareness;Decreased knowledge of use of DME or AE;Decreased knowledge of precautions;Pain      OT Treatment/Interventions:      OT Goals(Current goals can be found in the care plan section) Acute Rehab OT Goals Patient Stated Goal: home tomorrow OT Goal Formulation: With patient Time For Goal Achievement: 12/28/20 Potential to Achieve Goals: Good ADL Goals Pt Will Perform Lower Body Bathing: with supervision;sit to/from stand Pt Will Perform Lower Body Dressing: with supervision;sit to/from stand;sitting/lateral leans Pt Will Transfer to Toilet: with min guard assist;ambulating Pt Will Perform Toileting - Clothing Manipulation and hygiene: Independently Additional ADL Goal #1: Pt will demonstrate independent ability to maintain RLE WB status during functional ADL task.  OT Frequency: Min 2X/week    AM-PAC OT "6 Clicks" Daily Activity     Outcome Measure Help from another person eating meals?: None Help from another person taking care of personal grooming?: A Little Help from another person toileting, which includes using toliet, bedpan, or urinal?: A Little Help from another person bathing (including washing, rinsing, drying)?: A Little Help from another person to put on and taking off regular upper body clothing?: A Little Help from another person to put on and taking off regular lower body clothing?: A Lot 6 Click Score: 18   End of Session Equipment Utilized During Treatment: Gait belt;Rolling walker Nurse Communication: Mobility status;Other (comment) (vitals)  Activity Tolerance: Patient tolerated treatment well Patient left: in  chair;with call bell/phone within reach;with family/visitor  present;with nursing/sitter in room  OT Visit Diagnosis: Unsteadiness on feet (R26.81);History of falling (Z91.81);Pain Pain - Right/Left: Right Pain - part of body: Hip                Time: 7121-9758 OT Time Calculation (min): 43 min Charges:  OT General Charges $OT Visit: 1 Visit OT Evaluation $OT Eval Moderate Complexity: 1 Mod OT Treatments $Therapeutic Activity: 23-37 mins    Versa Craton A Ahriana Gunkel 12/14/2020, 12:55 PM

## 2020-12-14 NOTE — Progress Notes (Signed)
PROGRESS NOTE    Tom Baker  WER:[id number] DOB: March 22, 1956 DOA: 12/13/2020 PCP: Irena Reichmann, DO   Brief Narrative: Tom Baker is a 65 y.o. male with a history of diabetes mellitus type 1, hypertension, hyperlipidemia, BPH. Patient presented after a fall, resulting in right hip fracture. No syncope. Orthopedic surgery consulted for surgical repair. Patient underwent IM nail placement on 6/11.   Assessment & Plan:   Principal Problem:   Closed right hip fracture (HCC) Active Problems:   Hypertension   Hypothyroidism   Diabetes mellitus type 1 (HCC)   Hyperlipidemia   AKI (acute kidney injury) (HCC)   BPH (benign prostatic hyperplasia)   Right hip fracture Secondary to fall. Orthopedic surgery consulted and performed IM nail placement on 12/14/20. Orthopedic recommendations for touchdown weight bearing. PT/OT ordered and recommendations are pending.  Elevated creatinine Not very convincing for AKI. Previous elevated creatinine in March of 2022 which would be concerning for possible CKD. Currently stable. -Watch creatinine.  Diabetes mellitus, type 1 Patient is managed via insulin pump as an outpatient and has elected to continue management via insulin pump while inpatient. CBG uncontrolled this morning. -Continue insulin pump per patient -Diabetic coordinator  Primary hypertension Patient is on amlodipine  Hypothyroidism -Continue Synthroid  Hyperlipidemia -Continue Crestor  BPH -Continue Cialis daily  Bilateral hand muscle wasting Noted by orthopedic surgery who have ordered cervical spine MRI   DVT prophylaxis: Lovenox Code Status:   Code Status: Full Code Family Communication: Wife at bedside Disposition Plan: Discharge likely home vs SNF in several days pending orthopedic surgery/PT/OT recommendations   Consultants:  Orthopedic surgery  Procedures:  IM NAIL PLACEMENT (12/13/2020)  Antimicrobials: None    Subjective: Worried about his high  blood sugar this morning. No other concerns.  Objective: Vitals:   12/14/20 0125 12/14/20 0156 12/14/20 0500 12/14/20 0959  BP: 101/63 (!) 100/58 91/62 (!) 117/59  Pulse: (!) 116 100 100 100  Resp: 19 17 16 18   Temp: 98 F (36.7 C) 98.9 F (37.2 C) 99.5 F (37.5 C) 99.5 F (37.5 C)  TempSrc:  Oral Oral Oral  SpO2: 100% 100% 100% 100%  Weight:      Height:        Intake/Output Summary (Last 24 hours) at 12/14/2020 1009 Last data filed at 12/14/2020 0125 Gross per 24 hour  Intake 2300 ml  Output 1425 ml  Net 875 ml   Filed Weights   12/13/20 1716  Weight: 85.3 kg    Examination:  General exam: Appears calm and comfortable Respiratory system: Clear to auscultation. Respiratory effort normal. Cardiovascular system: S1 & S2 heard, RRR. No murmurs, rubs, gallops or clicks. Gastrointestinal system: Abdomen is nondistended, soft and nontender. No organomegaly or masses felt. Normal bowel sounds heard. Central nervous system: Alert and oriented. No focal neurological deficits. Musculoskeletal: No edema. No calf tenderness.  Skin: No cyanosis. No rashes Psychiatry: Judgement and insight appear normal. Mood & affect appropriate.     Data Reviewed: I have personally reviewed following labs and imaging studies  CBC Lab Results  Component Value Date   WBC 8.9 12/14/2020   RBC 2.98 (L) 12/14/2020   HGB 8.5 (L) 12/14/2020   HCT 26.3 (L) 12/14/2020   MCV 88.3 12/14/2020   MCH 28.5 12/14/2020   PLT 175 12/14/2020   MCHC 32.3 12/14/2020   RDW 13.6 12/14/2020     Last metabolic panel Lab Results  Component Value Date   NA 137 12/14/2020   K 4.3 12/14/2020  CL 105 12/14/2020   CO2 24 12/14/2020   BUN 22 12/14/2020   CREATININE 1.59 (H) 12/14/2020   GLUCOSE 242 (H) 12/14/2020   GFRNONAA 48 (L) 12/14/2020   GFRAA 75 07/26/2017   CALCIUM 8.2 (L) 12/14/2020   PROT 5.9 (L) 12/13/2020   ALBUMIN 3.6 12/13/2020   LABGLOB 2.3 07/04/2017   AGRATIO 2.1 07/04/2017    BILITOT 1.2 12/13/2020   ALKPHOS 50 12/13/2020   AST 24 12/13/2020   ALT 16 12/13/2020   ANIONGAP 8 12/14/2020    CBG (last 3)  Recent Labs    12/14/20 0201 12/14/20 0531 12/14/20 0820  GLUCAP 151* 225* 249*     GFR: Estimated Creatinine Clearance: 55.4 mL/min (A) (by C-G formula based on SCr of 1.59 mg/dL (H)).  Coagulation Profile: Recent Labs  Lab 12/14/20 0404  INR 1.3*    Recent Results (from the past 240 hour(s))  Resp Panel by RT-PCR (Flu A&B, Covid) Nasopharyngeal Swab     Status: None   Collection Time: 12/13/20  6:05 PM   Specimen: Nasopharyngeal Swab; Nasopharyngeal(NP) swabs in vial transport medium  Result Value Ref Range Status   SARS Coronavirus 2 by RT PCR NEGATIVE NEGATIVE Final    Comment: (NOTE) SARS-CoV-2 target nucleic acids are NOT DETECTED.  The SARS-CoV-2 RNA is generally detectable in upper respiratory specimens during the acute phase of infection. The lowest concentration of SARS-CoV-2 viral copies this assay can detect is 138 copies/mL. A negative result does not preclude SARS-Cov-2 infection and should not be used as the sole basis for treatment or other patient management decisions. A negative result may occur with  improper specimen collection/handling, submission of specimen other than nasopharyngeal swab, presence of viral mutation(s) within the areas targeted by this assay, and inadequate number of viral copies(<138 copies/mL). A negative result must be combined with clinical observations, patient history, and epidemiological information. The expected result is Negative.  Fact Sheet for Patients:  BloggerCourse.com  Fact Sheet for Healthcare Providers:  SeriousBroker.it  This test is no t yet approved or cleared by the [place] FDA and  has been authorized for detection and/or diagnosis of SARS-CoV-2 by FDA under an Emergency Use Authorization (EUA). This EUA will remain  in  effect (meaning this test can be used) for the duration of the COVID-19 declaration under Section 564(b)(1) of the Act, 21 U.S.C.section 360bbb-3(b)(1), unless the authorization is terminated  or revoked sooner.       Influenza A by PCR NEGATIVE NEGATIVE Final   Influenza B by PCR NEGATIVE NEGATIVE Final    Comment: (NOTE) The Xpert Xpress SARS-CoV-2/FLU/RSV plus assay is intended as an aid in the diagnosis of influenza from Nasopharyngeal swab specimens and should not be used as a sole basis for treatment. Nasal washings and aspirates are unacceptable for Xpert Xpress SARS-CoV-2/FLU/RSV testing.  Fact Sheet for Patients: BloggerCourse.com  Fact Sheet for Healthcare Providers: SeriousBroker.it  This test is not yet approved or cleared by the [place] FDA and has been authorized for detection and/or diagnosis of SARS-CoV-2 by FDA under an Emergency Use Authorization (EUA). This EUA will remain in effect (meaning this test can be used) for the duration of the COVID-19 declaration under Section 564(b)(1) of the Act, 21 U.S.C. section 360bbb-3(b)(1), unless the authorization is terminated or revoked.  Performed at [facility], [address], [place], [place] [address]         Radiology Studies: DG Chest Port 1 View  Result Date: 12/13/2020 CLINICAL  DATA:  Right hip fracture. EXAM: PORTABLE CHEST 1 VIEW COMPARISON:  CTA chest dated December 13, 2015. Chest x-ray dated December 12, 2015. FINDINGS: The heart size and mediastinal contours are within normal limits. Both lungs are clear. The visualized skeletal structures are unremarkable. IMPRESSION: No active disease. Electronically Signed   By: Obie DredgeWilliam T Derry M.D.   On: 12/13/2020 18:01   DG C-Arm 1-60 Min  Result Date: 12/14/2020 CLINICAL DATA:  Right femoral intramedullary nail placement EXAM: RIGHT FEMUR 2 VIEWS; DG C-ARM 1-60 MIN COMPARISON:  Radiographs 12/13/2020  FINDINGS: Sequential fluoroscopic images depict interval placement of an intramedullary nail with proximal transcervical fixation screws and distal fixation screws in the distal femoral metadiaphysis. Intramedullary nail transfixes the proximal subtrochanteric femur fracture diminished displacement an associated butterfly fragment as well. No acute complication. IMPRESSION: Intraoperative fluoroscopy during placement of an intramedullary nail for fixation a subtrochanteric femur fracture. No acute complication. See operative report for further details. Electronically Signed   By: Kreg ShropshirePrice  DeHay M.D.   On: 12/14/2020 02:23   DG Hip [place] 1 [place] Right  Result Date: 12/13/2020 CLINICAL DATA:  Fall. EXAM: DG HIP (WITH OR WITHOUT PELVIS) 1V PORT RIGHT COMPARISON:  None. FINDINGS: Acute displaced comminuted right subtrochanteric femur fracture. Mild bilateral hip osteoarthritis. The pubic symphysis and sacroiliac joints are intact. IMPRESSION: 1. Acute displaced right subtrochanteric femur fracture. Electronically Signed   By: Obie DredgeWilliam T Derry M.D.   On: 12/13/2020 18:00   DG FEMUR, MIN 2 VIEWS RIGHT  Result Date: 12/14/2020 CLINICAL DATA:  Right femoral intramedullary nail placement EXAM: RIGHT FEMUR 2 VIEWS; DG C-ARM 1-60 MIN COMPARISON:  Radiographs 12/13/2020 FINDINGS: Sequential fluoroscopic images depict interval placement of an intramedullary nail with proximal transcervical fixation screws and distal fixation screws in the distal femoral metadiaphysis. Intramedullary nail transfixes the proximal subtrochanteric femur fracture diminished displacement an associated butterfly fragment as well. No acute complication. IMPRESSION: Intraoperative fluoroscopy during placement of an intramedullary nail for fixation a subtrochanteric femur fracture. No acute complication. See operative report for further details. Electronically Signed   By: Kreg ShropshirePrice  DeHay M.D.   On: 12/14/2020 02:23   DG FEMUR  PORT, MIN 2 VIEWS RIGHT  Result Date: 12/14/2020 CLINICAL DATA:  65 year old male postop repair of femur fracture EXAM: RIGHT FEMUR PORTABLE 2 VIEW COMPARISON:  12/13/2020 FINDINGS: Interval surgical changes of fixation of right-sided sub trochanteric hip fracture with antegrade intramedullary rod, parallel cannulated screw femoral neck fixation, and distal interlocking screw fixation. Alignment maintained at the fracture site. Expected soft tissue changes surrounding the hip and distal thigh including some subcutaneous gas. Surgical changes in the pelvis.  Degenerative changes of the hips. IMPRESSION: Interval surgical changes of ORIF of right subtrochanteric hip fracture as above. Electronically Signed   By: Gilmer MorJaime  Wagner D.O.   On: 12/14/2020 09:45   DG FEMUR PORT, MIN 2 VIEWS RIGHT  Result Date: 12/13/2020 CLINICAL DATA:  Right proximal femoral fracture. EXAM: RIGHT FEMUR PORTABLE 2 VIEW COMPARISON:  None. FINDINGS: The distal femur is intact.  No other fractures identified. IMPRESSION: The distal femur is intact with no other fractures. Electronically Signed   By: Gerome Samavid  Williams III M.D   On: 12/13/2020 18:52        Scheduled Meds:  acetaminophen  500 mg Oral Q8H   amLODipine  5 mg Oral Daily   aspirin EC  81 mg Oral QHS   dextrose  12.5 g Intravenous STAT   docusate sodium  100 mg Oral  BID   [START ON 12/15/2020] enoxaparin (LOVENOX) injection  40 mg Subcutaneous Q24H   insulin pump   Subcutaneous TID WC, HS, 0200   levothyroxine  125 mcg Oral Daily   losartan  100 mg Oral Daily   methocarbamol  750 mg Oral TID   rosuvastatin  5 mg Oral Daily   tadalafil  5 mg Oral Daily   timolol  1 drop Left Eye BID   Continuous Infusions:   ceFAZolin (ANCEF) IV 2 g (12/14/20 0247)   insulin Stopped (12/14/20 0115)   methocarbamol (ROBAXIN) IV       LOS: 1 day     Jacquelin Hawking, MD [facility] 12/14/2020, 10:09 AM  If 7PM-7AM, please contact night-coverage [email or web address]

## 2020-12-14 NOTE — Anesthesia Postprocedure Evaluation (Signed)
Anesthesia Post Note  Patient: Tom Baker  Procedure(s) Performed: INTRAMEDULLARY (IM) NAIL INTERTROCHANTRIC (Right: Hip)     Patient location during evaluation: PACU Anesthesia Type: General Level of consciousness: awake and alert, patient cooperative and oriented Pain management: pain level controlled Vital Signs Assessment: post-procedure vital signs reviewed and stable Respiratory status: spontaneous breathing, nonlabored ventilation and respiratory function stable Cardiovascular status: blood pressure returned to baseline and stable Postop Assessment: no apparent nausea or vomiting Anesthetic complications: no   No notable events documented.  Last Vitals:  Vitals:   12/14/20 0110 12/14/20 0125  BP: 100/65 101/63  Pulse: (!) 117 (!) 116  Resp: 17 19  Temp: 36.7 C 36.7 C  SpO2: 100% 100%    Last Pain:  Vitals:   12/14/20 0125  PainSc: 0-No pain                 Staci Dack,E. Lillybeth Tal

## 2020-12-14 NOTE — Transfer of Care (Signed)
Immediate Anesthesia Transfer of Care Note  Patient: Tom Baker  Procedure(s) Performed: INTRAMEDULLARY (IM) NAIL INTERTROCHANTRIC (Right: Hip)  Patient Location: PACU  Anesthesia Type:General  Level of Consciousness: awake, alert  and oriented  Airway & Oxygen Therapy: Patient Spontanous Breathing and Patient connected to nasal cannula oxygen  Post-op Assessment: Report given to RN, Post -op Vital signs reviewed and stable and Patient moving all extremities X 4  Post vital signs: Reviewed and stable  Last Vitals:  Vitals Value Taken Time  BP 103/63 12/14/20 0040  Temp    Pulse 123 12/14/20 0044  Resp 14 12/14/20 0044  SpO2 100 % 12/14/20 0044  Vitals shown include unvalidated device data.  Last Pain:  Vitals:   12/13/20 1845  PainSc: 2          Complications: No notable events documented.

## 2020-12-14 NOTE — Progress Notes (Signed)
    Subjective: Patient reports pain as mild to moderate. Well controlled with medicine. Tolerating diet. Urinating. No CP, SOB.  Has not worked with PT/OT on mobilization OOB yet.   Objective:   VITALS:   Vitals:   12/14/20 0125 12/14/20 0156 12/14/20 0500 12/14/20 0959  BP: 101/63 (!) 100/58 91/62 (!) 117/59  Pulse: (!) 116 100 100 100  Resp: 19 17 16 18   Temp: 98 F (36.7 C) 98.9 F (37.2 C) 99.5 F (37.5 C) 99.5 F (37.5 C)  TempSrc:  Oral Oral Oral  SpO2: 100% 100% 100% 100%  Weight:      Height:       CBC Latest Ref Rng & Units 12/14/2020 12/13/2020 12/13/2020  WBC 4.0 - 10.5 K/uL 8.9 - 6.8  Hemoglobin 13.0 - 17.0 g/dL 02/12/2021) 10.2(L) 12.1(L)  Hematocrit 39.0 - 52.0 % 26.3(L) 30.0(L) 36.0(L)  Platelets 150 - 400 K/uL 175 - 218   BMP Latest Ref Rng & Units 12/14/2020 12/13/2020 12/13/2020  Glucose 70 - 99 mg/dL 02/12/2021) 425(Z) 563(O)  BUN 8 - 23 mg/dL 22 22 23   Creatinine 0.61 - 1.24 mg/dL 756(E) 3.32(R)  BUN/Creat Ratio 10 - 24 - - -  Sodium 135 - 145 mmol/L 137 141 138  Potassium 3.5 - 5.1 mmol/L 4.3 4.4 4.7  Chloride 98 - 111 mmol/L 105 104 109  CO2 22 - 32 mmol/L 24 - 23  Calcium 8.9 - 10.3 mg/dL 8.2(L) - 8.4(L)   Intake/Output      06/11 0701 06/12 0700 06/12 0701 06/13 0700   I.V. (mL/kg) 1500 (17.6)    Other 200    IV Piggyback 600    Total Intake(mL/kg) 2300 (27)    Urine (mL/kg/hr) 1125    Blood 300    Total Output 1425    Net +875            Physical Exam: General: NAD. Sitting up in bed. Calm, comfortable Resp: No increased wob Cardio: regular rate and rhythm ABD soft Neurologically intact MSK Neurovascularly intact Sensation intact distally Intact pulses distally Dorsiflexion/Plantar flexion intact Incision: dressings are all completely saturated with blood. Will change them now   Assessment: 1 Day Post-Op  S/P Procedure(s) (LRB): INTRAMEDULLARY (IM) NAIL INTERTROCHANTRIC (Right) by Dr. 08/12 on 12/13/20  Principal Problem:    Closed right hip fracture (HCC) Active Problems:   Hypertension   Hypothyroidism   Diabetes mellitus type 1 (HCC)   Hyperlipidemia   AKI (acute kidney injury) (HCC)   BPH (benign prostatic hyperplasia)   Plan: Monitor dressings. H&H is decreased today likely d/t ABL during surgery. Due to excessive bleeding on dressings may want to follow with CBC. Advance diet Up with therapy Incentive Spirometry Elevate and Apply ice  Weightbearing: TDWB RLE Insicional and dressing care: Reinforce dressings as needed Orthopedic device(s): None Showering: Keep dressing dry VTE prophylaxis: Lovenox 40mg  qd  for 3 weeks post op , SCDs, ambulation Pain control: Continue current regimen Follow - up plan:  in the office with Dr. Carola Frost information for today:  02/12/21 MD, PA-C  Dispo:  TBD based on PT/OT evals.    Apolinar Junes, PA-C Office [phone] 12/14/2020, 10:26 AM

## 2020-12-14 NOTE — Progress Notes (Signed)
Pharmacy states patient does not need Dextrose IV with fluids due to being on personal insulin pump

## 2020-12-15 ENCOUNTER — Inpatient Hospital Stay (HOSPITAL_COMMUNITY): Payer: BC Managed Care – PPO

## 2020-12-15 LAB — GLUCOSE, CAPILLARY
Glucose-Capillary: 192 mg/dL — ABNORMAL HIGH (ref 70–99)
Glucose-Capillary: 250 mg/dL — ABNORMAL HIGH (ref 70–99)
Glucose-Capillary: 212 mg/dL — ABNORMAL HIGH (ref 70–99)
Glucose-Capillary: 222 mg/dL — ABNORMAL HIGH (ref 70–99)
Glucose-Capillary: 269 mg/dL — ABNORMAL HIGH (ref 70–99)
Glucose-Capillary: 243 mg/dL — ABNORMAL HIGH (ref 70–99)
Glucose-Capillary: 202 mg/dL — ABNORMAL HIGH (ref 70–99)

## 2020-12-15 LAB — BASIC METABOLIC PANEL
Anion gap: 5 (ref 5–15)
BUN: 30 mg/dL — ABNORMAL HIGH (ref 8–23)
CO2: 26 mmol/L (ref 22–32)
Calcium: 8.5 mg/dL — ABNORMAL LOW (ref 8.9–10.3)
Chloride: 105 mmol/L (ref 98–111)
Creatinine, Ser: 1.75 mg/dL — ABNORMAL HIGH (ref 0.61–1.24)
GFR, Estimated: 43 mL/min — ABNORMAL LOW (ref >60)
Glucose, Bld: 250 mg/dL — ABNORMAL HIGH (ref 70–99)
Potassium: 4.5 mmol/L (ref 3.5–5.1)
Sodium: 136 mmol/L (ref 135–145)

## 2020-12-15 LAB — CBC
HCT: 21.7 % — ABNORMAL LOW (ref 39.0–52.0)
Hemoglobin: 7.4 g/dL — ABNORMAL LOW (ref 13.0–17.0)
MCH: 29.2 pg (ref 26.0–34.0)
MCHC: 34.1 g/dL (ref 30.0–36.0)
MCV: 85.8 fL (ref 80.0–100.0)
Platelets: 153 10*3/uL (ref 150–400)
RBC: 2.53 MIL/uL — ABNORMAL LOW (ref 4.22–5.81)
RDW: 13.5 % (ref 11.5–15.5)
WBC: 6.6 10*3/uL (ref 4.0–10.5)
nRBC: 0 % (ref 0.0–0.2)

## 2020-12-15 LAB — PREPARE RBC (CROSSMATCH)

## 2020-12-15 LAB — HEMOGLOBIN AND HEMATOCRIT, BLOOD
HCT: 19.9 % — ABNORMAL LOW (ref 39.0–52.0)
Hemoglobin: 6.8 g/dL — CL (ref 13.0–17.0)

## 2020-12-15 MED ORDER — INSULIN ASPART 100 UNIT/ML IJ SOLN: 0.0000 [IU] | Freq: Three times a day (TID) | INTRAMUSCULAR | Status: DC

## 2020-12-15 MED ORDER — ASCORBIC ACID 500 MG PO TABS
1000.0000 mg | ORAL_TABLET | Freq: Every day | ORAL | Status: DC
  Administered 2020-12-15 – 2020-12-18 (×4): 1000 mg via ORAL
  Filled 2020-12-15 (×4): qty 2

## 2020-12-15 MED ORDER — INSULIN ASPART 100 UNIT/ML IJ SOLN
0.0000 [IU] | Freq: Three times a day (TID) | INTRAMUSCULAR | Status: DC
  Administered 2020-12-15: 5 [IU] via SUBCUTANEOUS
  Administered 2020-12-16: 3 [IU] via SUBCUTANEOUS
  Administered 2020-12-16: 5 [IU] via SUBCUTANEOUS

## 2020-12-15 MED ORDER — VITAMIN D 25 MCG (1000 UNIT) PO TABS
2000.0000 [IU] | ORAL_TABLET | Freq: Two times a day (BID) | ORAL | Status: DC
  Administered 2020-12-15 – 2020-12-18 (×7): 2000 [IU] via ORAL
  Filled 2020-12-15 (×7): qty 2

## 2020-12-15 MED ORDER — INSULIN GLARGINE 100 UNIT/ML ~~LOC~~ SOLN
12.0000 [IU] | Freq: Every day | SUBCUTANEOUS | Status: AC
  Administered 2020-12-15: 12 [IU] via SUBCUTANEOUS
  Filled 2020-12-15: qty 0.12

## 2020-12-15 MED ORDER — SODIUM CHLORIDE 0.9% IV SOLUTION: Freq: Once | INTRAVENOUS | Status: DC

## 2020-12-15 MED ORDER — SODIUM CHLORIDE 0.9 % IV BOLUS
1000.0000 mL | Freq: Once | INTRAVENOUS | Status: AC
  Administered 2020-12-15: 1000 mL via INTRAVENOUS

## 2020-12-15 MED ORDER — INSULIN ASPART 100 UNIT/ML IJ SOLN
4.0000 [IU] | Freq: Three times a day (TID) | INTRAMUSCULAR | Status: AC
  Administered 2020-12-15 – 2020-12-16 (×3): 4 [IU] via SUBCUTANEOUS

## 2020-12-15 MED ORDER — INSULIN ASPART 100 UNIT/ML IJ SOLN
0.0000 [IU] | Freq: Every day | INTRAMUSCULAR | Status: AC
  Administered 2020-12-15: 2 [IU] via SUBCUTANEOUS

## 2020-12-15 NOTE — Progress Notes (Signed)
Orthopaedic Trauma Service Progress Note  Patient ID: Tom Baker MRN: [id number] DOB/AGE: [birth year] 65 y.o.  Subjective:  Doing well Pain better than pre-op No specific complaints  Denies any previous workup for neck/c-spine pathology Previously went to chiropractor for "adjustments" but has not been in 5-6 years  Profound intrinsic hand muscle wasting B noted  Denies subjective weakness in upper extremity    ROS As above  Objective:   VITALS:   Vitals:   12/14/20 1833 12/14/20 2102 12/15/20 0455 12/15/20 0841  BP:  98/67 (!) 103/47 (!) 108/55  Pulse: (!) 109 100 (!) 102 (!) 105  Resp:  16 16 18   Temp:  99.9 F (37.7 C) 99.1 F (37.3 C) 98.9 F (37.2 C)  TempSrc:  Oral Oral   SpO2:  98% 100% 100%  Weight:      Height:        Estimated body mass index is 23.5 kg/m as calculated from the following:   Height as of this encounter: 6\' 3"  (1.905 m).   Weight as of this encounter: 85.3 kg.   Intake/Output      06/12 0701 06/13 0700 06/13 0701 06/14 0700   I.V. (mL/kg) 14 (0.2)    Other     IV Piggyback 0    Total Intake(mL/kg) 14 (0.2)    Urine (mL/kg/hr) 1425 (0.7)    Blood     Total Output 1425    Net -1411           LABS  Results for orders placed or performed during the hospital encounter of 12/13/20 (from the past 24 hour(s))  Glucose, capillary     Status: Abnormal   Collection Time: 12/14/20  2:48 PM  Result Value Ref Range   Glucose-Capillary 263 (H) 70 - 99 mg/dL  Glucose, capillary     Status: Abnormal   Collection Time: 12/14/20  4:59 PM  Result Value Ref Range   Glucose-Capillary 255 (H) 70 - 99 mg/dL  Glucose, capillary     Status: Abnormal   Collection Time: 12/14/20  9:02 PM  Result Value Ref Range   Glucose-Capillary 261 (H) 70 - 99 mg/dL  Glucose, capillary     Status: Abnormal   Collection Time: 12/14/20 11:43 PM  Result Value Ref Range    Glucose-Capillary 231 (H) 70 - 99 mg/dL  Glucose, capillary     Status: Abnormal   Collection Time: 12/15/20  3:54 AM  Result Value Ref Range   Glucose-Capillary 250 (H) 70 - 99 mg/dL  CBC     Status: Abnormal   Collection Time: 12/15/20  4:44 AM  Result Value Ref Range   WBC 6.6 4.0 - 10.5 K/uL   RBC 2.53 (L) 4.22 - 5.81 MIL/uL   Hemoglobin 7.4 (L) 13.0 - 17.0 g/dL   HCT 12/17/20 (L) 12/17/20 - 14.4 %   MCV 85.8 80.0 - 100.0 fL   MCH 29.2 26.0 - 34.0 pg   MCHC 34.1 30.0 - 36.0 g/dL   RDW 81.8 56.3 - 14.9 %   Platelets 153 150 - 400 K/uL   nRBC 0.0 0.0 - 0.2 %  Basic metabolic panel     Status: Abnormal   Collection Time: 12/15/20  4:44 AM  Result Value Ref Range   Sodium 136 135 - 145 mmol/L  Potassium 4.5 3.5 - 5.1 mmol/L   Chloride 105 98 - 111 mmol/L   CO2 26 22 - 32 mmol/L   Glucose, Bld 250 (H) 70 - 99 mg/dL   BUN 30 (H) 8 - 23 mg/dL   Creatinine, Ser 8.92 (H) 0.61 - 1.24 mg/dL   Calcium 8.5 (L) 8.9 - 10.3 mg/dL   GFR, Estimated 43 (L) >60 mL/min   Anion gap 5 5 - 15  Glucose, capillary     Status: Abnormal   Collection Time: 12/15/20 11:48 AM  Result Value Ref Range   Glucose-Capillary 212 (H) 70 - 99 mg/dL     PHYSICAL EXAM:   Gen: resting comfortably in bed, NAD, appears well  Lungs: unlabored Cardiac: reg Ext:       Right Lower Extremity   Dressings R hip c/d/I + DP pulse  Ext cool but symmetric   Distal motor and sensory functions appear to be grossly intact  No DCT  Compartments are soft  Minimal swelling   Skin changes consistent with chronic PVD noted B         B upper extremities  Profound hand muscle wasting noted  Radial, ulnar, median nv sensation grossly intact  5/5 strength with shoulder abduction, shoulder shrug             No pain with axial load of c-spine   + radial pulse                 Assessment/Plan: 2 Days Post-Op   Principal Problem:   Closed right hip fracture (HCC) Active Problems:   Hypertension   Hypothyroidism    Diabetes mellitus type 1 (HCC)   Hyperlipidemia   AKI (acute kidney injury) (HCC)   BPH (benign prostatic hyperplasia)   Anti-infectives (From admission, onward)    Start     Dose/Rate Route Frequency Ordered Stop   12/14/20 0300  ceFAZolin (ANCEF) IVPB 2g/100 mL premix        2 g 200 mL/hr over 30 Minutes Intravenous Every 6 hours 12/14/20 0150 12/14/20 1212   12/13/20 1900  ceFAZolin (ANCEF) IVPB 2g/100 mL premix        2 g 200 mL/hr over 30 Minutes Intravenous To Surgery 12/13/20 1829 12/13/20 2131     .  POD/HD#: 2  65 y/o male s/p fall with R subtrochanteric femur fracture   -fall  - R subtrochanteric femur fracture s/p IMN   PWB R leg (25%) with assistance  ROM as tolerated R hip and knee  Ice prn   Dressing changes as needed  PT/OT  - B intrinsic hand muscle wasting   Workup to r/o c-spine pathology   MRI pending today  - Pain management:  Multimodal   - ABL anemia/Hemodynamics  H/h later this afternoon  Transfusion if hgb <7  - Medical issues   Per primary   - DVT/PE prophylaxis:  Lovenox for now  Will likely dc on xarelto   - ID:   Periop abx completed   - Metabolic Bone Disease:  Vitamin d deficiency    Supplement   - FEN/GI prophylaxis/Foley/Lines:  Carb mod diet  - Impediments to fracture healing:  DM  Vitamin d deficiency  - Dispo:  Continue with therapies   Follow up on MRI c-spine     Mearl Latin, PA-C [phone] (C) 12/15/2020, 12:11 PM  [facility] [address] [place] [address] [phone] [phone] (F)    After 5pm and on  the weekends please log on to Amion, go to orthopaedics and the look under the [facility] Call for the provider(s) on call. You can also call our office at [phone] and then follow the prompts to be connected to the call team.

## 2020-12-15 NOTE — Progress Notes (Signed)
Inpatient Diabetes Program Recommendations  AACE/ADA: New Consensus Statement on Inpatient Glycemic Control (2015)  Target Ranges:  Prepandial:   less than 140 mg/dL      Peak postprandial:   less than 180 mg/dL (1-2 hours)      Critically ill patients:  140 - 180 mg/dL   Lab Results  Component Value Date   GLUCAP 222 (H) 12/15/2020   HGBA1C 5.9 (H) 07/04/2017    Review of Glycemic Control  Diabetes history: DM1 Outpatient Diabetes medications: Medtronic insulin pump-see settings below Current orders for Inpatient glycemic control: Lantus 12 units qd + Novolog 4 units tid meal coverage + Novolog correction 0-15 units + hs 0-5 units  Inpatient Diabetes Program Recommendations:   Spoke with patient and wife @ bedside. Patient sees Dr. Talmage Nap for endocrinology. Insulin pump settings: Basal rates:  MN-3 am-0.5 units/hr  3 am -7am-0.55  7am-10 am -0.575  10 am-N -0.45  12N-3 pm -0.475  3pm-5pm-0.525  5pm-9pm - 0.4  9pm-MN 0.4 Total basal =11.6  Carb ratio 1 unit per 15-18 gms carbohydrate Target CBG 140 Sensitivity 1 unit lowers CBG 65 points/dl  Consider: -Decrease Novolog correction to 1-9 units tid + hs 0-5 units Secure chat sent to Dr. Caleb Popp.  Received Steroid prior to surgery 10 mg on 12/13/20 and discussed with patient how steroids will increase resistance to insulin. No additional steroids ordered.  Thank you, Tom Baker. Tom Girtman, RN, MSN, CDE  Diabetes Coordinator Inpatient Glycemic Control Team Team Pager [phone] (8am-5pm) 12/15/2020 2:43 PM

## 2020-12-15 NOTE — Progress Notes (Signed)
Provider returned page. Vitals retaken and told to proceed with giving blood transfusion.     12/15/20 1911  Vitals  Temp 100 F (37.8 C)  Temp Source Oral  BP (!) 95/51  MAP (mmHg) (!) 63  BP Location Left Arm  BP Method Automatic  Patient Position (if appropriate) Lying  Pulse Rate (!) 102  Pulse Rate Source Monitor  Level of Consciousness  Level of Consciousness Alert  MEWS COLOR  MEWS Score Color Yellow  Oxygen Therapy  SpO2 100 %  O2 Device Room Air  Pain Assessment  Pain Scale 0-10  Pain Score 0  PCA/Epidural/Spinal Assessment  Respiratory Pattern Regular  Glasgow Coma Scale  Eye Opening 4  Best Verbal Response (NON-intubated) 5  Best Motor Response 6  Glasgow Coma Scale Score 15  MEWS Score  MEWS Temp 0  MEWS Systolic 1  MEWS Pulse 1  MEWS RR 0  MEWS LOC 0  MEWS Score 2  Provider Notification  Provider Name/Title Rathore  Date Provider Notified 12/15/20  Time Provider Notified 1924  Notification Type Page  Notification Reason Other (Comment) (yellow MEWS)  Provider response Other (Comment) (phone call)  Date of Provider Response 12/15/20  Time of Provider Response 1931

## 2020-12-15 NOTE — Evaluation (Signed)
Physical Therapy Evaluation Patient Details Name: Tom Baker MRN: [id number] DOB: July 23, 1955 Today's Date: 12/15/2020   History of Present Illness  Louis Ivery is a 65 y.o. male who presented to the ER 6/11 status post fall onto right hip.  Now s/p INTRAMEDULLARY (IM) NAIL INTERTROCHANTRIC (Right: Hip) 6/11. He had taken his dog out to go to the bathroom, was standing on the porch talking to his neighbor when he fell onto his right hip. Pt with medical history significant of type 1 diabetes on insulin pump, hypertension, hypothyroidism, hyperlipidemia and BPH.   Clinical Impression  Pt presents with generalized weakness, RLE post-op discomfort during mobility but none at rest, impaired balance with history of neuropathy, min difficulty performing mobility tasks, and decreased activity tolerance. Pt to benefit from acute PT to address deficits. Pt ambulated short hallway distance with use of RW and hop-to gait, pt maintaining TDWB status but requires max cuing for sequencing task. PT feels pt needs x1 more day to progress mobility and practice step navigation, does not currently have tolerance. PT to progress mobility as tolerated, and will continue to follow acutely.      Follow Up Recommendations Home health PT;Supervision/Assistance - 24 hour;Supervision for mobility/OOB    Equipment Recommendations  Rolling walker with 5" wheels;3in1 (PT)    Recommendations for Other Services       Precautions / Restrictions Precautions Precautions: Fall Restrictions Weight Bearing Restrictions: Yes RLE Weight Bearing: Partial weight bearing Other Position/Activity Restrictions: Ortho note states TDWB, orders state PWB; for conservative meausres pt maintains TDWB this session      Mobility  Bed Mobility Overal bed mobility: Needs Assistance Bed Mobility: Supine to Sit;Sit to Supine     Supine to sit: Min guard Sit to supine: Min guard   General bed mobility comments: min guard for increased  time, HOB elevation    Transfers Overall transfer level: Needs assistance Equipment used: Rolling walker (2 wheeled) Transfers: Sit to/from Stand Sit to Stand: Min assist         General transfer comment: min assist for initial power up, balance while transitioning UEs to RW, steadying upon upright.  Ambulation/Gait Ambulation/Gait assistance: Min assist Gait Distance (Feet): 6 Feet Assistive device: Rolling walker (2 wheeled) Gait Pattern/deviations: Step-to pattern;Trunk flexed;Antalgic Gait velocity: decr   General Gait Details: Min assist to steady, max cuing for TDWB RLE, step-to gait to prevent excessive WB through RLE, sequencing.  Stairs            Wheelchair Mobility    Modified Rankin (Stroke Patients Only)       Balance Overall balance assessment: Needs assistance Sitting-balance support: No upper extremity supported Sitting balance-Leahy Scale: Good     Standing balance support: Bilateral upper extremity supported Standing balance-Leahy Scale: Poor Standing balance comment: reliant on external support                             Pertinent Vitals/Pain Pain Assessment: Faces Faces Pain Scale: No hurt Pain Intervention(s): Limited activity within patient's tolerance;Monitored during session    Home Living Family/patient expects to be discharged to:: Private residence Living Arrangements: Spouse/significant other Available Help at Discharge: Available PRN/intermittently Type of Home: House Home Access: Stairs to enter Entrance Stairs-Rails: None Entrance Stairs-Number of Steps: 3 Home Layout: One level   Additional Comments: Wife works as a Social worker, she plans to move her schedule around to be with pt 24/7 upon d/c  Prior Function Level of Independence: Independent         Comments: Works in Consulting civil engineer, Theatre stage manager   Dominant Hand: Left    Extremity/Trunk Assessment   Upper Extremity Assessment Upper  Extremity Assessment: Defer to OT evaluation    Lower Extremity Assessment Lower Extremity Assessment: RLE deficits/detail RLE Deficits / Details: anticipated post-operative weakness; able to perform ankle pumps, quad set x10, partial ROM heel slide RLE Sensation: history of peripheral neuropathy    Cervical / Trunk Assessment Cervical / Trunk Assessment: Normal  Communication   Communication: No difficulties  Cognition Arousal/Alertness: Awake/alert Behavior During Therapy: WFL for tasks assessed/performed Overall Cognitive Status: Within Functional Limits for tasks assessed                                        General Comments      Exercises General Exercises - Lower Extremity Ankle Circles/Pumps: AROM;Both;10 reps;Supine Quad Sets: AROM;Right;10 reps;Supine   Assessment/Plan    PT Assessment Patient needs continued PT services  PT Problem List Decreased strength;Decreased mobility;Decreased safety awareness;Decreased activity tolerance;Decreased balance;Decreased knowledge of use of DME;Pain;Decreased range of motion;Decreased coordination       PT Treatment Interventions DME instruction;Therapeutic activities;Gait training;Therapeutic exercise;Patient/family education;Balance training;Stair training;Functional mobility training;Neuromuscular re-education    PT Goals (Current goals can be found in the Care Plan section)  Acute Rehab PT Goals Patient Stated Goal: get steadier PT Goal Formulation: With patient Time For Goal Achievement: 12/29/20 Potential to Achieve Goals: Good    Frequency Min 5X/week   Barriers to discharge        Co-evaluation               AM-PAC PT "6 Clicks" Mobility  Outcome Measure Help needed turning from your back to your side while in a flat bed without using bedrails?: A Little Help needed moving from lying on your back to sitting on the side of a flat bed without using bedrails?: A Little Help needed moving  to and from a bed to a chair (including a wheelchair)?: A Little Help needed standing up from a chair using your arms (e.g., wheelchair or bedside chair)?: A Little Help needed to walk in hospital room?: A Little Help needed climbing 3-5 steps with a railing? : A Lot 6 Click Score: 17    End of Session Equipment Utilized During Treatment: Gait belt Activity Tolerance: Patient limited by pain;Patient limited by fatigue Patient left: in bed;with call bell/phone within reach;with bed alarm set;with nursing/sitter in room;with family/visitor present;with SCD's reapplied Nurse Communication: Mobility status PT Visit Diagnosis: Unsteadiness on feet (R26.81);Difficulty in walking, not elsewhere classified (R26.2);Pain Pain - Right/Left: Right Pain - part of body: Leg    Time: 0911-0938 PT Time Calculation (min) (ACUTE ONLY): 27 min   Charges:   PT Evaluation $PT Eval Low Complexity: 1 Low PT Treatments $Therapeutic Activity: 8-22 mins      Marye Round, PT DPT Acute Rehabilitation Services Pager [phone]  Office [phone]   Tyrone Apple E Christain [place] 12/15/2020, 9:55 AM

## 2020-12-15 NOTE — Progress Notes (Signed)
PROGRESS NOTE    Tom Baker  FMB:[id number] DOB: [birth year] DOA: 12/13/2020 PCP: Irena Reichmann, DO   Brief Narrative: Tom Baker is a 65 y.o. male with a history of diabetes mellitus type 1, hypertension, hyperlipidemia, BPH. Patient presented after a fall, resulting in right hip fracture. No syncope. Orthopedic surgery consulted for surgical repair. Patient underwent IM nail placement on 6/11.   Assessment & Plan:   Principal Problem:   Closed right hip fracture (HCC) Active Problems:   Hypertension   Hypothyroidism   Diabetes mellitus type 1 (HCC)   Hyperlipidemia   AKI (acute kidney injury) (HCC)   BPH (benign prostatic hyperplasia)   Right hip fracture Secondary to fall. Orthopedic surgery consulted and performed IM nail placement on 12/14/20. Orthopedic recommendations for touchdown weight bearing. PT/OT ordered and recommending HHPT.  AKI Previous elevated creatinine in March of 2022 which would be concerning for possible CKD. Currently worsened. Also in setting of worsening anemia. -NS bolus -Encourage PO fluids  Acute blood loss anemia Secondary to fracture and surgery. EBL per anesthesia note of 300 mL. Baseline hemoglobin of 12.1. Hemoglobin trended down to 7.4 today. Some mild tachycardic but otherwise asymptomatic. -Repeat H&H this afternoon -CBC daily -Transfuse for hemoglobin <7  Diabetes mellitus, type 1 Patient is managed via insulin pump as an outpatient and has elected to continue management via insulin pump while inpatient. Blood sugar continues to be uncontrolled -Hold insulin pump for 24 hours -Lantus 12 units x1 in addition to SSI and Novolog 4 units TID with meals for 24 hours -Resume insulin pump per patient in 24 hours -Diabetic coordinator  Primary hypertension Patient is on amlodipine  Hypothyroidism -Continue Synthroid  Hyperlipidemia -Continue Crestor  BPH -Continue Cialis daily  Bilateral hand muscle wasting Noted by  orthopedic surgery who have ordered cervical spine MRI   DVT prophylaxis: Lovenox Code Status:   Code Status: Full Code Family Communication: Wife at bedside Disposition Plan: Discharge likely home likely in 48 hours pending improvement of AKI, MRI   Consultants:  Orthopedic surgery  Procedures:  IM NAIL PLACEMENT (12/13/2020)  Antimicrobials: None    Subjective: No issues overnight. Hesitant to undergo MRI secondary to not wanting his insulin pump discontinued.  Objective: Vitals:   12/14/20 1833 12/14/20 2102 12/15/20 0455 12/15/20 0841  BP:  98/67 (!) 103/47 (!) 108/55  Pulse: (!) 109 100 (!) 102 (!) 105  Resp:  16 16 18   Temp:  99.9 F (37.7 C) 99.1 F (37.3 C) 98.9 F (37.2 C)  TempSrc:  Oral Oral   SpO2:  98% 100% 100%  Weight:      Height:        Intake/Output Summary (Last 24 hours) at 12/15/2020 1219 Last data filed at 12/15/2020 0406 Gross per 24 hour  Intake 14.04 ml  Output 1425 ml  Net -1410.96 ml    Filed Weights   12/13/20 1716  Weight: 85.3 kg    Examination:  General exam: Appears calm and comfortable Respiratory system: Clear to auscultation. Respiratory effort normal. Cardiovascular system: S1 & S2 heard, Slightly tachycardic with normal rhythm. No murmurs, rubs, gallops or clicks. Gastrointestinal system: Abdomen is nondistended, soft and nontender. No organomegaly or masses felt. Normal bowel sounds heard. Central nervous system: Alert and oriented. No focal neurological deficits. Musculoskeletal: No edema. No calf tenderness Skin: No cyanosis. No rashes Psychiatry: Judgement and insight appear normal. Mood & affect appropriate.     Data Reviewed: I have personally reviewed following labs and imaging  studies  CBC Lab Results  Component Value Date   WBC 6.6 12/15/2020   RBC 2.53 (L) 12/15/2020   HGB 7.4 (L) 12/15/2020   HCT 21.7 (L) 12/15/2020   MCV 85.8 12/15/2020   MCH 29.2 12/15/2020   PLT 153 12/15/2020   MCHC 34.1  12/15/2020   RDW 13.5 12/15/2020     Last metabolic panel Lab Results  Component Value Date   NA 136 12/15/2020   K 4.5 12/15/2020   CL 105 12/15/2020   CO2 26 12/15/2020   BUN 30 (H) 12/15/2020   CREATININE 1.75 (H) 12/15/2020   GLUCOSE 250 (H) 12/15/2020   GFRNONAA 43 (L) 12/15/2020   GFRAA 75 07/26/2017   CALCIUM 8.5 (L) 12/15/2020   PROT 5.9 (L) 12/13/2020   ALBUMIN 3.6 12/13/2020   LABGLOB 2.3 07/04/2017   AGRATIO 2.1 07/04/2017   BILITOT 1.2 12/13/2020   ALKPHOS 50 12/13/2020   AST 24 12/13/2020   ALT 16 12/13/2020   ANIONGAP 5 12/15/2020    CBG (last 3)  Recent Labs    12/14/20 2343 12/15/20 0354 12/15/20 1148  GLUCAP 231* 250* 212*      GFR: Estimated Creatinine Clearance: 50.3 mL/min (A) (by C-G formula based on SCr of 1.75 mg/dL (H)).  Coagulation Profile: Recent Labs  Lab 12/14/20 0404  INR 1.3*     Recent Results (from the past 240 hour(s))  Resp Panel by RT-PCR (Flu A&B, Covid) Nasopharyngeal Swab     Status: None   Collection Time: 12/13/20  6:05 PM   Specimen: Nasopharyngeal Swab; Nasopharyngeal(NP) swabs in vial transport medium  Result Value Ref Range Status   SARS Coronavirus 2 by RT PCR NEGATIVE NEGATIVE Final    Comment: (NOTE) SARS-CoV-2 target nucleic acids are NOT DETECTED.  The SARS-CoV-2 RNA is generally detectable in upper respiratory specimens during the acute phase of infection. The lowest concentration of SARS-CoV-2 viral copies this assay can detect is 138 copies/mL. A negative result does not preclude SARS-Cov-2 infection and should not be used as the sole basis for treatment or other patient management decisions. A negative result may occur with  improper specimen collection/handling, submission of specimen other than nasopharyngeal swab, presence of viral mutation(s) within the areas targeted by this assay, and inadequate number of viral copies(<138 copies/mL). A negative result must be combined with clinical  observations, patient history, and epidemiological information. The expected result is Negative.  Fact Sheet for Patients:  BloggerCourse.comhttps://www.fda.gov/media/152166/download  Fact Sheet for Healthcare Providers:  SeriousBroker.ithttps://www.fda.gov/media/152162/download  This test is no t yet approved or cleared by the [place] FDA and  has been authorized for detection and/or diagnosis of SARS-CoV-2 by FDA under an Emergency Use Authorization (EUA). This EUA will remain  in effect (meaning this test can be used) for the duration of the COVID-19 declaration under Section 564(b)(1) of the Act, 21 U.S.C.section 360bbb-3(b)(1), unless the authorization is terminated  or revoked sooner.       Influenza A by PCR NEGATIVE NEGATIVE Final   Influenza B by PCR NEGATIVE NEGATIVE Final    Comment: (NOTE) The Xpert Xpress SARS-CoV-2/FLU/RSV plus assay is intended as an aid in the diagnosis of influenza from Nasopharyngeal swab specimens and should not be used as a sole basis for treatment. Nasal washings and aspirates are unacceptable for Xpert Xpress SARS-CoV-2/FLU/RSV testing.  Fact Sheet for Patients: BloggerCourse.comhttps://www.fda.gov/media/152166/download  Fact Sheet for Healthcare Providers: SeriousBroker.ithttps://www.fda.gov/media/152162/download  This test is not yet approved or cleared by the [place] FDA and has been  authorized for detection and/or diagnosis of SARS-CoV-2 by FDA under an Emergency Use Authorization (EUA). This EUA will remain in effect (meaning this test can be used) for the duration of the COVID-19 declaration under Section 564(b)(1) of the Act, 21 U.S.C. section 360bbb-3(b)(1), unless the authorization is terminated or revoked.  Performed at [facility], [address], [place], [place] [address]          Radiology Studies: DG Chest Port 1 View  Result Date: 12/13/2020 CLINICAL DATA:  Right hip fracture. EXAM: PORTABLE CHEST 1 VIEW COMPARISON:  CTA chest dated December 13, 2015. Chest  x-ray dated December 12, 2015. FINDINGS: The heart size and mediastinal contours are within normal limits. Both lungs are clear. The visualized skeletal structures are unremarkable. IMPRESSION: No active disease. Electronically Signed   By: Obie Dredge M.D.   On: 12/13/2020 18:01   DG C-Arm 1-60 Min  Result Date: 12/14/2020 CLINICAL DATA:  Right femoral intramedullary nail placement EXAM: RIGHT FEMUR 2 VIEWS; DG C-ARM 1-60 MIN COMPARISON:  Radiographs 12/13/2020 FINDINGS: Sequential fluoroscopic images depict interval placement of an intramedullary nail with proximal transcervical fixation screws and distal fixation screws in the distal femoral metadiaphysis. Intramedullary nail transfixes the proximal subtrochanteric femur fracture diminished displacement an associated butterfly fragment as well. No acute complication. IMPRESSION: Intraoperative fluoroscopy during placement of an intramedullary nail for fixation a subtrochanteric femur fracture. No acute complication. See operative report for further details. Electronically Signed   By: Kreg Shropshire M.D.   On: 12/14/2020 02:23   DG Hip [place] or [place] Pelvis 1 View Right  Result Date: 12/13/2020 CLINICAL DATA:  Fall. EXAM: DG HIP (WITH OR WITHOUT PELVIS) 1V PORT RIGHT COMPARISON:  None. FINDINGS: Acute displaced comminuted right subtrochanteric femur fracture. Mild bilateral hip osteoarthritis. The pubic symphysis and sacroiliac joints are intact. IMPRESSION: 1. Acute displaced right subtrochanteric femur fracture. Electronically Signed   By: Obie Dredge M.D.   On: 12/13/2020 18:00   DG FEMUR, MIN 2 VIEWS RIGHT  Result Date: 12/14/2020 CLINICAL DATA:  Right femoral intramedullary nail placement EXAM: RIGHT FEMUR 2 VIEWS; DG C-ARM 1-60 MIN COMPARISON:  Radiographs 12/13/2020 FINDINGS: Sequential fluoroscopic images depict interval placement of an intramedullary nail with proximal transcervical fixation screws and distal fixation screws in the  distal femoral metadiaphysis. Intramedullary nail transfixes the proximal subtrochanteric femur fracture diminished displacement an associated butterfly fragment as well. No acute complication. IMPRESSION: Intraoperative fluoroscopy during placement of an intramedullary nail for fixation a subtrochanteric femur fracture. No acute complication. See operative report for further details. Electronically Signed   By: Kreg Shropshire M.D.   On: 12/14/2020 02:23   DG FEMUR PORT, MIN 2 VIEWS RIGHT  Result Date: 12/14/2020 CLINICAL DATA:  65 year old male postop repair of femur fracture EXAM: RIGHT FEMUR PORTABLE 2 VIEW COMPARISON:  12/13/2020 FINDINGS: Interval surgical changes of fixation of right-sided sub trochanteric hip fracture with antegrade intramedullary rod, parallel cannulated screw femoral neck fixation, and distal interlocking screw fixation. Alignment maintained at the fracture site. Expected soft tissue changes surrounding the hip and distal thigh including some subcutaneous gas. Surgical changes in the pelvis.  Degenerative changes of the hips. IMPRESSION: Interval surgical changes of ORIF of right subtrochanteric hip fracture as above. Electronically Signed   By: Gilmer Mor D.O.   On: 12/14/2020 09:45   DG FEMUR PORT, MIN 2 VIEWS RIGHT  Result Date: 12/13/2020 CLINICAL DATA:  Right proximal femoral fracture. EXAM: RIGHT FEMUR PORTABLE 2 VIEW COMPARISON:  None. FINDINGS: The distal femur is intact.  No other fractures identified. IMPRESSION: The distal femur is intact with no other fractures. Electronically Signed   By: Gerome Sam III M.D   On: 12/13/2020 18:52        Scheduled Meds:  acetaminophen  500 mg Oral Q8H   amLODipine  5 mg Oral Daily   aspirin EC  81 mg Oral QHS   docusate sodium  100 mg Oral BID   enoxaparin (LOVENOX) injection  40 mg Subcutaneous Q24H   insulin aspart  0-15 Units Subcutaneous TID WC   insulin aspart  0-5 Units Subcutaneous QHS   insulin aspart  4 Units  Subcutaneous TID WC   insulin glargine  12 Units Subcutaneous Daily   insulin pump   Subcutaneous TID WC, HS, 0200   levothyroxine  125 mcg Oral Daily   losartan  100 mg Oral Daily   methocarbamol  750 mg Oral TID   rosuvastatin  5 mg Oral Daily   tadalafil  5 mg Oral Daily   timolol  1 drop Left Eye BID   Continuous Infusions:  methocarbamol (ROBAXIN) IV       LOS: 2 days     Jacquelin Hawking, MD [facility] 12/15/2020, 12:19 PM  If 7PM-7AM, please contact night-coverage [email or web address]

## 2020-12-15 NOTE — Progress Notes (Signed)
Bedside shift report complete. Received patient awake,alert/orientedx4 and able to verbalize needs. NAD, respirations even on room air. Blood transfusing at this time. Continuous pulse ox in place. Movement/sensation to all extremities noted. Dressings to RLE c/d/I; some previous drainage noted. SCDs on to bilateral lower extremities. Whiteboard updated. All safety measures in place and personal belongings within reach.

## 2020-12-15 NOTE — Progress Notes (Signed)
Occupational Therapy Treatment Patient Details Name: Colon Rueth MRN: [id number] DOB: [birth year] Today's Date: 12/15/2020    History of present illness Tom Baker is a 65 y.o. male who presented to the ER 6/11 status post fall onto right hip.  Now s/p INTRAMEDULLARY (IM) NAIL INTERTROCHANTRIC (Right: Hip) 6/11. He had taken his dog out to go to the bathroom, was standing on the porch talking to his neighbor when he fell onto his right hip. Pt with medical history significant of type 1 diabetes on insulin pump, hypertension, hypothyroidism, hyperlipidemia and BPH.   OT comments  Kailyn is progressing well. Pt verbalized concern for managing 3 steps to enter his home. Pt reported he does not have medical equipment at home. Spent increased time educating pt on tub bench transfer with managing rw and BLEs. Pt verbalized that in the past he was getting down into the tub, this is not a safe option right now. Pt with no reports of dizziness or nausea this session. Pt continues to benefit from OT acutely to progress function in ADLs and mobility. D/c plan remains the same.    Follow Up Recommendations  Home health OT;Supervision - Intermittent    Equipment Recommendations  Other (comment);Tub/shower bench       Precautions / Restrictions Precautions Precautions: Fall Restrictions Weight Bearing Restrictions: Yes RLE Weight Bearing: Partial weight bearing RLE Partial Weight Bearing Percentage or Pounds: 25 Other Position/Activity Restrictions: Ortho note states TDWB, orders state PWB; for conservative meausres pt maintains TDWB this session       Mobility Bed Mobility Overal bed mobility: Needs Assistance             General bed mobility comments: pt in chair upon arrival    Transfers Overall transfer level: Needs assistance Equipment used: Rolling walker (2 wheeled) Transfers: Sit to/from Stand Sit to Stand: Min guard         General transfer comment: close min guard     Balance Overall balance assessment: Needs assistance Sitting-balance support: No upper extremity supported Sitting balance-Leahy Scale: Good                                     ADL either performed or assessed with clinical judgement   ADL Overall ADL's : Needs assistance/impaired           Toilet Transfer: Min guard;Comfort height toilet;Grab bars;RW         Web designer Details (indicate cue type and reason): Verbally and visually reviewed tub bench transfer, pt verbalized understanding Functional mobility during ADLs: Min guard;Rolling walker;Cueing for safety        Cognition Arousal/Alertness: Awake/alert Behavior During Therapy: WFL for tasks assessed/performed Overall Cognitive Status: Within Functional Limits for tasks assessed         General Comments: Pt expressed anxiety and concern for how he will manage 3 STE his home wtihout railing              General Comments Pt expressed concern of managing stairs; no new concerns noted    Pertinent Vitals/ Pain       Pain Assessment: Faces Faces Pain Scale: Hurts a little bit Pain Location: R hip Pain Descriptors / Indicators: Discomfort;Operative site guarding;Guarding;Grimacing Pain Intervention(s): Monitored during session   Frequency  Min 2X/week        Progress Toward Goals  OT Goals(current goals can now be found in the care plan section)  Progress towards OT goals: Progressing toward goals  Acute Rehab OT Goals Patient Stated Goal: to practice stairs OT Goal Formulation: With patient Time For Goal Achievement: 12/28/20 Potential to Achieve Goals: Good ADL Goals Pt Will Perform Lower Body Bathing: with supervision;sit to/from stand Pt Will Perform Lower Body Dressing: with supervision;sit to/from stand;sitting/lateral leans Pt Will Transfer to Toilet: with min guard assist;ambulating Pt Will Perform Toileting - Clothing Manipulation and hygiene:  Independently Additional ADL Goal #1: Pt will demonstrate independent ability to maintain RLE WB status during functional ADL task.  Plan Discharge plan remains appropriate       AM-PAC OT "6 Clicks" Daily Activity     Outcome Measure   Help from another person eating meals?: None Help from another person taking care of personal grooming?: A Little Help from another person toileting, which includes using toliet, bedpan, or urinal?: A Little Help from another person bathing (including washing, rinsing, drying)?: A Little Help from another person to put on and taking off regular upper body clothing?: A Little Help from another person to put on and taking off regular lower body clothing?: A Lot 6 Click Score: 18    End of Session Equipment Utilized During Treatment: Gait belt;Rolling walker  OT Visit Diagnosis: Unsteadiness on feet (R26.81);History of falling (Z91.81);Pain Pain - Right/Left: Right Pain - part of body: Hip   Activity Tolerance Patient tolerated treatment well   Patient Left in chair;with call bell/phone within reach;with nursing/sitter in room   Nurse Communication Mobility status        Time: 8889-1694 OT Time Calculation (min): 15 min  Charges: OT General Charges $OT Visit: 1 Visit OT Treatments $Self Care/Home Management : 8-22 mins    Astoria Condon A Damonte Frieson 12/15/2020, 4:13 PM

## 2020-12-16 ENCOUNTER — Inpatient Hospital Stay (HOSPITAL_COMMUNITY): Payer: BC Managed Care – PPO

## 2020-12-16 ENCOUNTER — Encounter (HOSPITAL_COMMUNITY): Payer: Self-pay | Admitting: Orthopedic Surgery

## 2020-12-16 DIAGNOSIS — I739 Peripheral vascular disease, unspecified: Secondary | ICD-10-CM

## 2020-12-16 LAB — CBC
HCT: 21.8 % — ABNORMAL LOW (ref 39.0–52.0)
Hemoglobin: 7.2 g/dL — ABNORMAL LOW (ref 13.0–17.0)
MCH: 28.6 pg (ref 26.0–34.0)
MCHC: 33 g/dL (ref 30.0–36.0)
MCV: 86.5 fL (ref 80.0–100.0)
Platelets: 140 10*3/uL — ABNORMAL LOW (ref 150–400)
RBC: 2.52 MIL/uL — ABNORMAL LOW (ref 4.22–5.81)
RDW: 13.9 % (ref 11.5–15.5)
WBC: 5.9 10*3/uL (ref 4.0–10.5)
nRBC: 0 % (ref 0.0–0.2)

## 2020-12-16 LAB — BPAM RBC
Blood Product Expiration Date: 202207022359
ISSUE DATE / TIME: 202206131940
Unit Type and Rh: 6200

## 2020-12-16 LAB — HEMOGLOBIN AND HEMATOCRIT, BLOOD
HCT: 22.6 % — ABNORMAL LOW (ref 39.0–52.0)
HCT: 20.4 % — ABNORMAL LOW (ref 39.0–52.0)
Hemoglobin: 7.4 g/dL — ABNORMAL LOW (ref 13.0–17.0)
Hemoglobin: 6.8 g/dL — CL (ref 13.0–17.0)

## 2020-12-16 LAB — GLUCOSE, CAPILLARY
Glucose-Capillary: 154 mg/dL — ABNORMAL HIGH (ref 70–99)
Glucose-Capillary: 163 mg/dL — ABNORMAL HIGH (ref 70–99)
Glucose-Capillary: 178 mg/dL — ABNORMAL HIGH (ref 70–99)
Glucose-Capillary: 211 mg/dL — ABNORMAL HIGH (ref 70–99)
Glucose-Capillary: 218 mg/dL — ABNORMAL HIGH (ref 70–99)
Glucose-Capillary: 279 mg/dL — ABNORMAL HIGH (ref 70–99)

## 2020-12-16 LAB — BASIC METABOLIC PANEL
Anion gap: 8 (ref 5–15)
BUN: 21 mg/dL (ref 8–23)
CO2: 23 mmol/L (ref 22–32)
Calcium: 8.1 mg/dL — ABNORMAL LOW (ref 8.9–10.3)
Chloride: 105 mmol/L (ref 98–111)
Creatinine, Ser: 1.51 mg/dL — ABNORMAL HIGH (ref 0.61–1.24)
GFR, Estimated: 51 mL/min — ABNORMAL LOW (ref >60)
Glucose, Bld: 177 mg/dL — ABNORMAL HIGH (ref 70–99)
Potassium: 3.9 mmol/L (ref 3.5–5.1)
Sodium: 136 mmol/L (ref 135–145)

## 2020-12-16 LAB — TYPE AND SCREEN
ABO/RH(D): A POS
Antibody Screen: NEGATIVE
Unit division: 0

## 2020-12-16 LAB — PREPARE RBC (CROSSMATCH)

## 2020-12-16 MED ORDER — INSULIN GLARGINE 100 UNIT/ML ~~LOC~~ SOLN
12.0000 [IU] | Freq: Every day | SUBCUTANEOUS | Status: AC
  Administered 2020-12-16: 12 [IU] via SUBCUTANEOUS
  Filled 2020-12-16: qty 0.12

## 2020-12-16 MED ORDER — INSULIN ASPART 100 UNIT/ML IJ SOLN
0.0000 [IU] | Freq: Three times a day (TID) | INTRAMUSCULAR | Status: DC
  Administered 2020-12-16: 2 [IU] via SUBCUTANEOUS
  Administered 2020-12-17: 3 [IU] via SUBCUTANEOUS

## 2020-12-16 MED ORDER — SODIUM CHLORIDE 0.9% IV SOLUTION: Freq: Once | INTRAVENOUS | Status: DC

## 2020-12-16 MED ORDER — INSULIN ASPART 100 UNIT/ML IJ SOLN
0.0000 [IU] | Freq: Every day | INTRAMUSCULAR | Status: AC
  Administered 2020-12-16: 2 [IU] via SUBCUTANEOUS

## 2020-12-16 MED ORDER — INSULIN ASPART 100 UNIT/ML IJ SOLN
4.0000 [IU] | Freq: Three times a day (TID) | INTRAMUSCULAR | Status: AC
  Administered 2020-12-16 – 2020-12-17 (×3): 4 [IU] via SUBCUTANEOUS

## 2020-12-16 NOTE — Progress Notes (Signed)
PROGRESS NOTE    Tom Baker  GOT:[id number] DOB: 14-Oct-1955 DOA: 12/13/2020 PCP: Irena Reichmann, DO   Brief Narrative: Tom Baker is a 65 y.o. male with a history of diabetes mellitus type 1, hypertension, hyperlipidemia, BPH. Patient presented after a fall, resulting in right hip fracture. No syncope. Orthopedic surgery consulted for surgical repair. Patient underwent IM nail placement on 6/11.   Assessment & Plan:   Principal Problem:   Closed right hip fracture (HCC) Active Problems:   Hypertension   Hypothyroidism   Diabetes mellitus type 1 (HCC)   Hyperlipidemia   AKI (acute kidney injury) (HCC)   BPH (benign prostatic hyperplasia)   Right hip fracture Secondary to fall. Orthopedic surgery consulted and performed IM nail placement on 12/14/20. Orthopedic recommendations for touchdown weight bearing. PT/OT ordered and recommending HHPT.  AKI Previous elevated creatinine in March of 2022 which would be concerning for possible CKD. Currently worsened. Also in setting of worsening anemia. Improved with IV fluids. -Encourage PO fluids  Acute blood loss anemia Secondary to fracture and surgery. EBL per anesthesia note of 300 mL. Baseline hemoglobin of 12.1. Hemoglobin trended down to 7.4 on CBC on 6/13 with repeat hemoglobin on H&H of 6.8. 1 unit of PRBC transfused on 6/13; no post transfusion H&H drawn. CBC today of 7.2. Incision site without evidence of ecchymosis or definite evidence of hematoma. -Repeat H&H this afternoon -CBC daily -Transfuse for hemoglobin <7  Diabetes mellitus, type 1 Patient is managed via insulin pump as an outpatient and has elected to continue management via insulin pump while inpatient. Blood sugar continues to be uncontrolled -Hold insulin pump for another 24 hours -Lantus 12 units x1 in addition to SSI and Novolog 4 units TID with meals for 24 hours -Resume insulin pump per patient in 24 hours, discussed with pharmacy as well -Diabetic  coordinator  Primary hypertension Patient is on amlodipine -Continue amlodipine  Hypothyroidism -Continue Synthroid  Hyperlipidemia -Continue Crestor  BPH -Continue Cialis daily  Bilateral hand muscle wasting Noted by orthopedic surgery who have ordered cervical spine MRI   DVT prophylaxis: Lovenox Code Status:   Code Status: Full Code Family Communication: None at bedside Disposition Plan: Discharge likely home likely in 48 hours pending improvement of AKI, MRI   Consultants:  Orthopedic surgery  Procedures:  IM NAIL PLACEMENT (12/13/2020)  Antimicrobials: None    Subjective: No issues overnight.  Objective: Vitals:   12/15/20 2212 12/16/20 0033 12/16/20 0422 12/16/20 0827  BP: (!) 100/53 (!) 101/51 116/60 (!) 106/55  Pulse: (!) 103 97 96 95  Resp: 18 18 16 18   Temp: 100.3 F (37.9 C) 99.3 F (37.4 C) 99.9 F (37.7 C) 99.4 F (37.4 C)  TempSrc:  Oral Oral Oral  SpO2: 100% 100% 99% 99%  Weight:      Height:        Intake/Output Summary (Last 24 hours) at 12/16/2020 1300 Last data filed at 12/16/2020 1119 Gross per 24 hour  Intake 1213 ml  Output 1700 ml  Net -487 ml    Filed Weights   12/13/20 1716  Weight: 85.3 kg    Examination:  General exam: Appears calm and comfortable Respiratory system: Clear to auscultation. Respiratory effort normal. Cardiovascular system: S1 & S2 heard, RRR. No murmurs, rubs, gallops or clicks. Gastrointestinal system: Abdomen is nondistended, soft and nontender. No organomegaly or masses felt. Normal bowel sounds heard. Central nervous system: Alert and oriented. No focal neurological deficits. Musculoskeletal: No edema. No calf tenderness. Incision site  is slightly swollen but no definite area concerning for hematoma and no tenderness Skin: No cyanosis. No new rashes Psychiatry: Judgement and insight appear normal. Mood & affect appropriate.     Data Reviewed: I have personally reviewed following labs and  imaging studies  CBC Lab Results  Component Value Date   WBC 5.9 12/16/2020   RBC 2.52 (L) 12/16/2020   HGB 7.4 (L) 12/16/2020   HCT 22.6 (L) 12/16/2020   MCV 86.5 12/16/2020   MCH 28.6 12/16/2020   PLT 140 (L) 12/16/2020   MCHC 33.0 12/16/2020   RDW 13.9 12/16/2020     Last metabolic panel Lab Results  Component Value Date   NA 136 12/16/2020   K 3.9 12/16/2020   CL 105 12/16/2020   CO2 23 12/16/2020   BUN 21 12/16/2020   CREATININE 1.51 (H) 12/16/2020   GLUCOSE 177 (H) 12/16/2020   GFRNONAA 51 (L) 12/16/2020   GFRAA 75 07/26/2017   CALCIUM 8.1 (L) 12/16/2020   PROT 5.9 (L) 12/13/2020   ALBUMIN 3.6 12/13/2020   LABGLOB 2.3 07/04/2017   AGRATIO 2.1 07/04/2017   BILITOT 1.2 12/13/2020   ALKPHOS 50 12/13/2020   AST 24 12/13/2020   ALT 16 12/13/2020   ANIONGAP 8 12/16/2020    CBG (last 3)  Recent Labs    12/16/20 0419 12/16/20 0854 12/16/20 1117  GLUCAP 178* 211* 279*      GFR: Estimated Creatinine Clearance: 58.3 mL/min (A) (by C-G formula based on SCr of 1.51 mg/dL (H)).  Coagulation Profile: Recent Labs  Lab 12/14/20 0404  INR 1.3*     Recent Results (from the past 240 hour(s))  Resp Panel by RT-PCR (Flu A&B, Covid) Nasopharyngeal Swab     Status: None   Collection Time: 12/13/20  6:05 PM   Specimen: Nasopharyngeal Swab; Nasopharyngeal(NP) swabs in vial transport medium  Result Value Ref Range Status   SARS Coronavirus 2 by RT PCR NEGATIVE NEGATIVE Final    Comment: (NOTE) SARS-CoV-2 target nucleic acids are NOT DETECTED.  The SARS-CoV-2 RNA is generally detectable in upper respiratory specimens during the acute phase of infection. The lowest concentration of SARS-CoV-2 viral copies this assay can detect is 138 copies/mL. A negative result does not preclude SARS-Cov-2 infection and should not be used as the sole basis for treatment or other patient management decisions. A negative result may occur with  improper specimen  collection/handling, submission of specimen other than nasopharyngeal swab, presence of viral mutation(s) within the areas targeted by this assay, and inadequate number of viral copies(<138 copies/mL). A negative result must be combined with clinical observations, patient history, and epidemiological information. The expected result is Negative.  Fact Sheet for Patients:  BloggerCourse.com  Fact Sheet for Healthcare Providers:  SeriousBroker.it  This test is no t yet approved or cleared by the [place] FDA and  has been authorized for detection and/or diagnosis of SARS-CoV-2 by FDA under an Emergency Use Authorization (EUA). This EUA will remain  in effect (meaning this test can be used) for the duration of the COVID-19 declaration under Section 564(b)(1) of the Act, 21 U.S.C.section 360bbb-3(b)(1), unless the authorization is terminated  or revoked sooner.       Influenza A by PCR NEGATIVE NEGATIVE Final   Influenza B by PCR NEGATIVE NEGATIVE Final    Comment: (NOTE) The Xpert Xpress SARS-CoV-2/FLU/RSV plus assay is intended as an aid in the diagnosis of influenza from Nasopharyngeal swab specimens and should not be used as a sole basis  for treatment. Nasal washings and aspirates are unacceptable for Xpert Xpress SARS-CoV-2/FLU/RSV testing.  Fact Sheet for Patients: BloggerCourse.com  Fact Sheet for Healthcare Providers: SeriousBroker.it  This test is not yet approved or cleared by the [place] FDA and has been authorized for detection and/or diagnosis of SARS-CoV-2 by FDA under an Emergency Use Authorization (EUA). This EUA will remain in effect (meaning this test can be used) for the duration of the COVID-19 declaration under Section 564(b)(1) of the Act, 21 U.S.C. section 360bbb-3(b)(1), unless the authorization is terminated or revoked.  Performed at [facility], [address] [place], [place] [address]          Radiology Studies: DG Cervical Spine Complete  Result Date: 12/15/2020 CLINICAL DATA:  65 year old male with muscle wasting. EXAM: CERVICAL SPINE - COMPLETE 4+ VIEW COMPARISON:  None. FINDINGS: There is no acute fracture or subluxation of the cervical spine. There is straightening of normal cervical lordosis which may be positional or due to muscle spasm. There is osteopenia with multilevel degenerative changes and spurring. The visualized posterior elements and odontoid appear intact. There is anatomic alignment of the lateral masses of C1 and C2. The soft tissues are unremarkable. IMPRESSION: No acute/traumatic cervical spine pathology. Electronically Signed   By: Elgie Collard M.D.   On: 12/15/2020 19:23        Scheduled Meds:  sodium chloride   Intravenous Once   acetaminophen  500 mg Oral Q8H   amLODipine  5 mg Oral Daily   vitamin C  1,000 mg Oral Daily   aspirin EC  81 mg Oral QHS   cholecalciferol  2,000 Units Oral BID   docusate sodium  100 mg Oral BID   enoxaparin (LOVENOX) injection  40 mg Subcutaneous Q24H   insulin aspart  0-5 Units Subcutaneous QHS   insulin aspart  0-9 Units Subcutaneous TID WC   insulin aspart  4 Units Subcutaneous TID WC   insulin glargine  12 Units Subcutaneous Daily   levothyroxine  125 mcg Oral Daily   losartan  100 mg Oral Daily   methocarbamol  750 mg Oral TID   rosuvastatin  5 mg Oral Daily   tadalafil  5 mg Oral Daily   timolol  1 drop Left Eye BID   Continuous Infusions:  methocarbamol (ROBAXIN) IV       LOS: 3 days     Jacquelin Hawking, MD [facility] 12/16/2020, 1:00 PM  If 7PM-7AM, please contact night-coverage [email or web address]

## 2020-12-16 NOTE — Progress Notes (Signed)
ABI's have been completed. Preliminary results can be found in CV Proc through chart review.   12/16/20 1:20 PM Olen Cordial RVT

## 2020-12-16 NOTE — Progress Notes (Signed)
[facility]  Pt OTF for various studies Will follow up once all studies have been completed No acute changes in care   Follow up on CBC Did receive PRBC yesterday   Mearl Latin, PA-C [phone] (C) 12/16/2020, 1:49 PM  [facility] [address] [place] [address] [phone] Collier Bullock (F)

## 2020-12-16 NOTE — Progress Notes (Signed)
Physical Therapy Treatment Patient Details Name: Tom Baker MRN: [id number] DOB: 10/22/1955 Today's Date: 12/16/2020    History of Present Illness Tom Baker is a 65 y.o. male who presented to the ER 6/11 status post fall onto right hip.  Now s/p INTRAMEDULLARY (IM) NAIL INTERTROCHANTRIC (Right: Hip) 6/11. He had taken his dog out to go to the bathroom, was standing on the porch talking to his neighbor when he fell onto his right hip. 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 Pt with medical history significant of type 1 diabetes on insulin pump, hypertension, hypothyroidism, hyperlipidemia and BPH.    PT Comments    Pt motivated to mobilize with PT, expresses he feels "off" but is not sure what feels off. Pt requiring overall less physical assist for mobility tasks today, but not able to progress beyond standing/pre-gait tasks given symptomatic hypotension. RN notified. PT expects pt to progress once BP more stable for mobility.      12/16/20 1500  Orthostatic Lying   BP- Lying 108/63  Pulse- Lying 96  Orthostatic Sitting  BP- Sitting (!) 88/53  Pulse- Sitting 108  Orthostatic Standing at 0 minutes  BP- Standing at 0 minutes (!) 88/58  Pulse- Standing at 0 minutes 128  Orthostatic Standing at 3 minutes  BP- Standing at 3 minutes (!) 81/66  Pulse- Standing at 3 minutes 128     Follow Up Recommendations  Home health PT;Supervision for mobility/OOB     Equipment Recommendations  Rolling walker with 5" wheels;3in1 (PT)    Recommendations for Other Services       Precautions / Restrictions Precautions Precautions: Fall Restrictions Weight Bearing Restrictions: Yes RLE Weight Bearing: Partial weight bearing Other Position/Activity Restrictions: Ortho note states TDWB, orders state PWB; for conservative meausres pt maintains TDWB this session    Mobility  Bed Mobility Overal bed  mobility: Needs Assistance Bed Mobility: Supine to Sit;Sit to Supine     Supine to sit: Min guard Sit to supine: Min assist   General bed mobility comments: min guard for supine>sit for safety, increased time and effort. Min assist for return to supine with RLE lifting into bed, positioning.    Transfers Overall transfer level: Needs assistance Equipment used: Rolling walker (2 wheeled) Transfers: Sit to/from Stand Sit to Stand: Min assist         General transfer comment: min assist to rise, steady, STS x2 from EOB. + orthostatic hypotension, symptomatic for dizziness.  Ambulation/Gait             General Gait Details: pre-gait only given symptomatic orthostasis   Stairs             Wheelchair Mobility    Modified Rankin (Stroke Patients Only)       Balance Overall balance assessment: Needs assistance Sitting-balance support: No upper extremity supported Sitting balance-Leahy Scale: Good     Standing balance support: During functional activity Standing balance-Leahy Scale: Poor Standing balance comment: reliant on external support                            Cognition Arousal/Alertness: Awake/alert Behavior During Therapy: WFL for tasks assessed/performed Overall Cognitive Status: Within Functional Limits for tasks assessed  General Comments: depressed affect, expresses tearfully "I just went through 18 months of this, I can't do this again"      Exercises General Exercises - Lower Extremity Hip Flexion/Marching: AROM;Right;20 reps;Standing    General Comments General comments (skin integrity, edema, etc.): significant hypotension with + symptoms of dizziness      Pertinent Vitals/Pain Pain Assessment: Faces Faces Pain Scale: Hurts a little bit Pain Location: R hip Pain Descriptors / Indicators: Discomfort;Operative site guarding;Guarding Pain Intervention(s): Limited activity within  patient's tolerance;Monitored during session;Repositioned    Home Living                      Prior Function            PT Goals (current goals can now be found in the care plan section) Acute Rehab PT Goals PT Goal Formulation: With patient Time For Goal Achievement: 12/29/20 Potential to Achieve Goals: Good Progress towards PT goals: Progressing toward goals    Frequency    Min 5X/week      PT Plan Current plan remains appropriate    Co-evaluation              AM-PAC PT "6 Clicks" Mobility   Outcome Measure  Help needed turning from your back to your side while in a flat bed without using bedrails?: A Little Help needed moving from lying on your back to sitting on the side of a flat bed without using bedrails?: A Little Help needed moving to and from a bed to a chair (including a wheelchair)?: A Little Help needed standing up from a chair using your arms (e.g., wheelchair or bedside chair)?: A Little Help needed to walk in hospital room?: A Little Help needed climbing 3-5 steps with a railing? : A Lot 6 Click Score: 17    End of Session   Activity Tolerance: Patient limited by pain;Patient limited by fatigue Patient left: in bed;with call bell/phone within reach;with nursing/sitter in room;with family/visitor present Nurse Communication: Mobility status PT Visit Diagnosis: Unsteadiness on feet (R26.81);Difficulty in walking, not elsewhere classified (R26.2);Pain Pain - Right/Left: Right Pain - part of body: Leg     Time: 1212-1235 PT Time Calculation (min) (ACUTE ONLY): 23 min  Charges:  $Therapeutic Activity: 8-22 mins                    Marye Round, PT DPT Acute Rehabilitation Services Pager [phone]  Office [phone]    Tom Baker [place] 12/16/2020, 3:20 PM

## 2020-12-16 NOTE — Consult Note (Signed)
Reason for Consult: Cervical spinal stenosis Referring Physician: Kittie Plater MD  Tom Baker is an 65 y.o. male.  HPI: Tom Baker is a 65 year old left-handed individual who tells me that he has been aware of atrophy in his hands for about the past 10 to 12 years.  He feels that his hand strength has been good and he attributed the changes to his diabetes.  He notes that he is seeing a chiropractor in the past for neck pain but this was greater than 15 years ago.  He had some intermittent adjustments but has not had any in the past several years.  He notes that he has a problem with balance on his feet but he attributes this also to his diabetes.  He refers no symptoms to his neck.  The patient has been admitted for a femur fracture.  He underwent surgical repair of this by Dr. Marcello Fennel and because of the concern of the degree of atrophy in his intrinsic hand muscles an MRI of the cervical spine was ordered.  Dr. 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  Patient has not been aware of any neck issues other than those outlined above.  Past Medical History:  Diagnosis Date   BPH (benign prostatic hyperplasia)    Complication of anesthesia    hard to urinate after surgery ankle surgery 2005 [facility]   Diabetes mellitus type 1 (HCC)    GERD (gastroesophageal reflux disease)    Glaucoma    left eye   Hyperlipidemia    Hypertension    Hypothyroid    Pericardial effusion 2017   Pneumonia 2017   Post-nasal drip 08/02/2016   Runny nose    occ at times pe pt   Wears glasses     Past Surgical History:  Procedure Laterality Date   ANKLE SURGERY Right 2010   [facility] plate and 6 screws still in   CHOLECYSTECTOMY  1995   laparoscopic   CYSTOSCOPY WITH INSERTION OF UROLIFT N/A 09/16/2020   Procedure: CYSTOSCOPY WITH INSERTION OF UROLIFT; URETHRAL DILATION;  Surgeon: Jerilee Field, MD;  Location: [facility];  Service: Urology;  Laterality: N/A;   EYE SURGERY Left 2003   lens replacement for cataract   INTRAMEDULLARY (IM) NAIL INTERTROCHANTERIC Right 12/13/2020   Procedure: INTRAMEDULLARY (IM) NAIL INTERTROCHANTRIC;  Surgeon: Myrene Galas, MD;  Location: [facility] OR;  Service: Orthopedics;  Laterality: Right;   KNEE SURGERY Right 1988   arthroscopic   radioactive iodine to thryoid  2000   TOE SURGERY Left 1972   great toe   VITRECTOMY  1999    Family History  Problem Relation Age of Onset   Bone cancer Father     Social History:  reports that he has never smoked. He has never used smokeless tobacco. He reports that he does not drink alcohol and does not use drugs.  Allergies: No Known Allergies  Medications: I have reviewed the patient's current medications.  Results for orders placed or performed during the hospital encounter of 12/13/20 (from the past 48 hour(s))  Glucose, capillary     Status: Abnormal   Collection Time: 12/14/20  4:59 PM  Result Value Ref Range   Glucose-Capillary 255 (H) 70 - 99 mg/dL    Comment: Glucose reference range applies only to samples taken after fasting for at least 8 hours.  Glucose, capillary     Status: Abnormal  Collection Time: 12/14/20  9:02 PM  Result Value Ref Range   Glucose-Capillary 261 (H) 70 - 99 mg/dL    Comment: Glucose reference range applies only to samples taken after fasting for at least 8 hours.  Glucose, capillary     Status: Abnormal   Collection Time: 12/14/20 11:43 PM  Result Value Ref Range   Glucose-Capillary 231 (H) 70 - 99 mg/dL    Comment: Glucose reference range applies only to samples taken after fasting for at least 8 hours.  Glucose, capillary     Status: Abnormal   Collection Time: 12/15/20  3:54 AM  Result Value Ref Range   Glucose-Capillary 250 (H) 70 - 99 mg/dL    Comment: Glucose reference range applies only to samples taken after fasting for at least 8 hours.  CBC     Status: Abnormal    Collection Time: 12/15/20  4:44 AM  Result Value Ref Range   WBC 6.6 4.0 - 10.5 K/uL   RBC 2.53 (L) 4.22 - 5.81 MIL/uL   Hemoglobin 7.4 (L) 13.0 - 17.0 g/dL   HCT 19.1 (L) 47.8 - 29.5 %   MCV 85.8 80.0 - 100.0 fL   MCH 29.2 26.0 - 34.0 pg   MCHC 34.1 30.0 - 36.0 g/dL   RDW 62.1 30.8 - 65.7 %   Platelets 153 150 - 400 K/uL   nRBC 0.0 0.0 - 0.2 %    Comment: Performed at [facility], [address], [place], [place] [address]  Basic metabolic panel     Status: Abnormal   Collection Time: 12/15/20  4:44 AM  Result Value Ref Range   Sodium 136 135 - 145 mmol/L   Potassium 4.5 3.5 - 5.1 mmol/L   Chloride 105 98 - 111 mmol/L   CO2 26 22 - 32 mmol/L   Glucose, Bld 250 (H) 70 - 99 mg/dL    Comment: Glucose reference range applies only to samples taken after fasting for at least 8 hours.   BUN 30 (H) 8 - 23 mg/dL   Creatinine, Ser 2.95 (H) 0.61 - 1.24 mg/dL   Calcium 8.5 (L) 8.9 - 10.3 mg/dL   GFR, Estimated 43 (L) >60 mL/min    Comment: (NOTE) Calculated using the CKD-EPI Creatinine Equation (2021)    Anion gap 5 5 - 15    Comment: Performed at [facility], [address], [place], [place] [address]  Glucose, capillary     Status: Abnormal   Collection Time: 12/15/20 11:48 AM  Result Value Ref Range   Glucose-Capillary 212 (H) 70 - 99 mg/dL    Comment: Glucose reference range applies only to samples taken after fasting for at least 8 hours.  Glucose, capillary     Status: Abnormal   Collection Time: 12/15/20 12:28 PM  Result Value Ref Range   Glucose-Capillary 222 (H) 70 - 99 mg/dL    Comment: Glucose reference range applies only to samples taken after fasting for at least 8 hours.  Hemoglobin and hematocrit, blood     Status: Abnormal   Collection Time: 12/15/20  3:35 PM  Result Value Ref Range   Hemoglobin 6.8 (LL) 13.0 - 17.0 g/dL    Comment: REPEATED TO VERIFY THIS CRITICAL RESULT HAS VERIFIED AND BEEN CALLED TO S HEDRICK RN BY KYUNG BAEK ON 06 13 2022 AT 1601,  AND HAS BEEN READ BACK.     HCT 19.9 (L) 39.0 - 52.0 %    Comment: Performed at  [facility], [address] [place], [place] [address]  Glucose, capillary     Status: Abnormal   Collection Time: 12/15/20  4:44 PM  Result Value Ref Range   Glucose-Capillary 269 (H) 70 - 99 mg/dL    Comment: Glucose reference range applies only to samples taken after fasting for at least 8 hours.  Prepare RBC (crossmatch)     Status: None   Collection Time: 12/15/20  5:30 PM  Result Value Ref Range   Order Confirmation      ORDERS RECEIVED TO CROSSMATCH Performed at [facility], [address] [place], [place] [address]   Glucose, capillary     Status: Abnormal   Collection Time: 12/15/20  8:15 PM  Result Value Ref Range   Glucose-Capillary 243 (H) 70 - 99 mg/dL    Comment: Glucose reference range applies only to samples taken after fasting for at least 8 hours.  Glucose, capillary     Status: Abnormal   Collection Time: 12/15/20  9:32 PM  Result Value Ref Range   Glucose-Capillary 202 (H) 70 - 99 mg/dL    Comment: Glucose reference range applies only to samples taken after fasting for at least 8 hours.  Glucose, capillary     Status: Abnormal   Collection Time: 12/16/20 12:25 AM  Result Value Ref Range   Glucose-Capillary 163 (H) 70 - 99 mg/dL    Comment: Glucose reference range applies only to samples taken after fasting for at least 8 hours.   Comment 1 Notify RN   CBC     Status: Abnormal   Collection Time: 12/16/20  3:25 AM  Result Value Ref Range   WBC 5.9 4.0 - 10.5 K/uL   RBC 2.52 (L) 4.22 - 5.81 MIL/uL   Hemoglobin 7.2 (L) 13.0 - 17.0 g/dL   HCT 09.821.8 (L) 11.939.0 - 14.752.0 %   MCV 86.5 80.0 - 100.0 fL   MCH 28.6 26.0 - 34.0 pg   MCHC 33.0 30.0 - 36.0 g/dL   RDW 82.913.9 56.211.5 - 13.015.5 %   Platelets 140 (L) 150 - 400 K/uL   nRBC 0.0 0.0 - 0.2 %    Comment: Performed at [facility], [address] [place], [place] [address]  Basic metabolic panel     Status: Abnormal    Collection Time: 12/16/20  3:25 AM  Result Value Ref Range   Sodium 136 135 - 145 mmol/L   Potassium 3.9 3.5 - 5.1 mmol/L   Chloride 105 98 - 111 mmol/L   CO2 23 22 - 32 mmol/L   Glucose, Bld 177 (H) 70 - 99 mg/dL    Comment: Glucose reference range applies only to samples taken after fasting for at least 8 hours.   BUN 21 8 - 23 mg/dL   Creatinine, Ser 4.691.51 (H) 0.61 - 1.24 mg/dL   Calcium 8.1 (L) 8.9 - 10.3 mg/dL   GFR, Estimated 51 (L) >60 mL/min    Comment: (NOTE) Calculated using the CKD-EPI Creatinine Equation (2021)    Anion gap 8 5 - 15    Comment: Performed at [facility], [address] [place], [place] [address]  Glucose, capillary     Status: Abnormal   Collection Time: 12/16/20  4:19 AM  Result Value Ref Range   Glucose-Capillary 178 (H) 70 - 99 mg/dL    Comment: Glucose reference range applies only to samples taken after fasting for at least 8 hours.  Glucose, capillary  Status: Abnormal   Collection Time: 12/16/20  8:54 AM  Result Value Ref Range   Glucose-Capillary 211 (H) 70 - 99 mg/dL    Comment: Glucose reference range applies only to samples taken after fasting for at least 8 hours.  Hemoglobin and hematocrit, blood     Status: Abnormal   Collection Time: 12/16/20 11:07 AM  Result Value Ref Range   Hemoglobin 7.4 (L) 13.0 - 17.0 g/dL   HCT 27.0 (L) 35.0 - 09.3 %    Comment: Performed at [facility], [address] [place], [place] [address]  Glucose, capillary     Status: Abnormal   Collection Time: 12/16/20 11:17 AM  Result Value Ref Range   Glucose-Capillary 279 (H) 70 - 99 mg/dL    Comment: Glucose reference range applies only to samples taken after fasting for at least 8 hours.    DG Cervical Spine Complete  Result Date: 12/15/2020 CLINICAL DATA:  65 year old male with muscle wasting. EXAM: CERVICAL SPINE - COMPLETE 4+ VIEW COMPARISON:  None. FINDINGS: There is no acute fracture or subluxation of the cervical spine. There is  straightening of normal cervical lordosis which may be positional or due to muscle spasm. There is osteopenia with multilevel degenerative changes and spurring. The visualized posterior elements and odontoid appear intact. There is anatomic alignment of the lateral masses of C1 and C2. The soft tissues are unremarkable. IMPRESSION: No acute/traumatic cervical spine pathology. Electronically Signed   By: Elgie Collard M.D.   On: 12/15/2020 19:23   MR CERVICAL SPINE WO CONTRAST  Result Date: 12/16/2020 CLINICAL DATA:  Cervical spine stenosis EXAM: MRI CERVICAL SPINE WITHOUT CONTRAST TECHNIQUE: Multiplanar, multisequence MR imaging of the cervical spine was performed. No intravenous contrast was administered. COMPARISON:  None. FINDINGS: Alignment: Multilevel mild retrolisthesis from C2 to C6. Vertebrae: Minor degenerative plate irregularity with small Schmorl's nodes. No substantial marrow edema. No suspicious osseous lesion. Cord: Possible subtle cord T2 hyperintensity at compressed levels. Posterior Fossa, vertebral arteries, paraspinal tissues: Unremarkable. Disc levels: C2-C3:  No canal or foraminal stenosis. C3-C4: Disc bulge with superimposed central protrusion and endplate osteophytes. Uncovertebral hypertrophy. Marked canal stenosis. Moderate right and marked left foraminal stenosis. C4-C5: Disc bulge with superimposed central protrusion and endplate osteophytes. Uncovertebral hypertrophy. Marked canal stenosis with cord compression. Marked foraminal stenosis. C5-C6: Disc bulge slightly eccentric to the right with endplate osteophytes. Uncovertebral hypertrophy. Marked canal stenosis with cord compression. Marked foraminal stenosis. C6-C7: Disc bulge with endplate osteophytes. Uncovertebral hypertrophy. Marked canal stenosis. Marked foraminal stenosis. C7-T1: Disc bulge with endplate osteophytes. No canal stenosis. Minor foraminal stenosis. IMPRESSION: Multilevel degenerative changes as detailed above.  There is significant canal stenosis at several levels, with cord compression at C4-C5 and C5-C6. Marked multilevel neural foraminal stenosis. Possible small cord T2 hyperintensity at the compressed levels may reflect edema or myelomalacia. Electronically Signed   By: Guadlupe Spanish M.D.   On: 12/16/2020 13:48   VAS [place] ABI WITH/WO TBI  Result Date: 12/16/2020  LOWER EXTREMITY DOPPLER STUDY Patient Name:  Tom Baker  Date of Exam:   12/16/2020 Medical Rec #: [id number]    Accession #:    [id number] Date of Birth: December 13, 1955    Patient Gender: M Patient Age:   64Y Exam Location:  [facility] Procedure:      VAS [place] ABI WITH/WO TBI Referring Phys: [id number] Morita --------------------------------------------------------------------------------  Indications: Peripheral artery disease. High Risk Factors: Hypertension, hyperlipidemia, Diabetes.  Performing Technologist: Olen Cordial RVT  Examination  Guidelines: A complete evaluation includes at minimum, Doppler waveform signals and systolic blood pressure reading at the level of bilateral brachial, anterior tibial, and posterior tibial arteries, when vessel segments are accessible. Bilateral testing is considered an integral part of a complete examination. Photoelectric Plethysmograph (PPG) waveforms and toe systolic pressure readings are included as required and additional duplex testing as needed. Limited examinations for reoccurring indications may be performed as noted.  ABI Findings: +---------+------------------+-----+---------+--------+ Right    Rt Pressure (mmHg)IndexWaveform Comment  +---------+------------------+-----+---------+--------+ Brachial 123                    triphasic         +---------+------------------+-----+---------+--------+ PTA      254               2.05 biphasic          +---------+------------------+-----+---------+--------+ DP       254               2.05 biphasic           +---------+------------------+-----+---------+--------+ Great Toe11                0.09                   +---------+------------------+-----+---------+--------+ +---------+------------------+-----+---------+-------+ Left     Lt Pressure (mmHg)IndexWaveform Comment +---------+------------------+-----+---------+-------+ Brachial 124                    triphasic        +---------+------------------+-----+---------+-------+ PTA      137               1.10 biphasic         +---------+------------------+-----+---------+-------+ DP       120               0.97 biphasic         +---------+------------------+-----+---------+-------+ Great Toe25                0.20                  +---------+------------------+-----+---------+-------+ +-------+-----------+-----------+------------+------------+ ABI/TBIToday's ABIToday's TBIPrevious ABIPrevious TBI +-------+-----------+-----------+------------+------------+ Right  [place]         0.09                                +-------+-----------+-----------+------------+------------+ Left   1.1        0.2                                 +-------+-----------+-----------+------------+------------+  Summary: Right: Resting right ankle-brachial index indicates noncompressible right lower extremity arteries. The right toe-brachial index is abnormal. Left: Resting left ankle-brachial index is within normal range. No evidence of significant left lower extremity arterial disease. The left toe-brachial index is abnormal.  *See table(s) above for measurements and observations.     Preliminary     Review of Systems  Constitutional: Negative.   HENT: Negative.    Eyes: Negative.   Respiratory: Negative.    Cardiovascular: Negative.   Gastrointestinal: Negative.   Endocrine: Negative.   Genitourinary: Negative.   Musculoskeletal: Negative.   Neurological:        Balance issues  Blood pressure (!) 106/55, pulse 95, temperature 99.4 F  (37.4 C), temperature source Oral, resp. rate 18, height 6\' 3"  (1.905 m), weight 85.3 kg, SpO2  99 %. Physical Exam Constitutional:      Appearance: Normal appearance. He is normal weight.  HENT:     Head: Normocephalic and atraumatic.     Nose: Nose normal.     Mouth/Throat:     Mouth: Mucous membranes are moist.  Eyes:     Extraocular Movements: Extraocular movements intact.     Pupils: Pupils are equal, round, and reactive to light.  Cardiovascular:     Rate and Rhythm: Normal rate and regular rhythm.  Pulmonary:     Effort: Pulmonary effort is normal.  Abdominal:     General: Bowel sounds are normal.  Musculoskeletal:     Comments: Upper extremity strength appears to be 5 out of 5 in the deltoids biceps triceps wrist extensors intrinsic strength reveals 4 out of 5 strength in the interossei.  Significant atrophy is noted in the thenar eminence mild atrophy in the hypothenar eminence and mild atrophy in the interossei.  Range of motion of his neck is good turning 60 degrees to either side and flexing and extending normally.  No tenderness is noted in the supraclavicular fossa  Skin:    General: Skin is warm and dry.     Capillary Refill: Capillary refill takes less than 2 seconds.  Neurological:     Mental Status: He is alert and oriented to person, place, and time.  Psychiatric:        Mood and Affect: Mood normal.        Behavior: Behavior normal.        Thought Content: Thought content normal.        Judgment: Judgment normal.    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  Plan: The patient will ultimately require surgical decompression and stabilization of his cervical spinal canal.  I had lengthy discussion with him and his wife regarding what that might entail.  First he will require a CT scan of the cervical spine to see the degree of calcification in his cervical spinal canal to make a decision regarding the approach.  The surgery at  this point is purely elective as the patient's neurologic status has been fairly stable.  He has some concerns regarding his work situation and plans for retirement as to when the surgery might need to be timed.  He also notes that he will be recovering from the femur surgery for the next several weeks.  All those things can be discussed on an outpatient basis and plans for cervical surgery can be made on an outpatient basis when he has had some time to recover from his femur.  In the meantime I will order a CT scan of the cervical spine to complete the anatomic imaging.  Shary Key Tom Baker 12/16/2020, 3:28 PM

## 2020-12-17 LAB — CBC
HCT: 25.9 % — ABNORMAL LOW (ref 39.0–52.0)
Hemoglobin: 8.7 g/dL — ABNORMAL LOW (ref 13.0–17.0)
MCH: 29.4 pg (ref 26.0–34.0)
MCHC: 33.6 g/dL (ref 30.0–36.0)
MCV: 87.5 fL (ref 80.0–100.0)
Platelets: 175 10*3/uL (ref 150–400)
RBC: 2.96 MIL/uL — ABNORMAL LOW (ref 4.22–5.81)
RDW: 13.9 % (ref 11.5–15.5)
WBC: 7.3 10*3/uL (ref 4.0–10.5)
nRBC: 0 % (ref 0.0–0.2)

## 2020-12-17 LAB — BASIC METABOLIC PANEL
Anion gap: 10 (ref 5–15)
BUN: 19 mg/dL (ref 8–23)
CO2: 25 mmol/L (ref 22–32)
Calcium: 8.4 mg/dL — ABNORMAL LOW (ref 8.9–10.3)
Chloride: 102 mmol/L (ref 98–111)
Creatinine, Ser: 1.55 mg/dL — ABNORMAL HIGH (ref 0.61–1.24)
GFR, Estimated: 49 mL/min — ABNORMAL LOW (ref >60)
Glucose, Bld: 306 mg/dL — ABNORMAL HIGH (ref 70–99)
Potassium: 4.1 mmol/L (ref 3.5–5.1)
Sodium: 137 mmol/L (ref 135–145)

## 2020-12-17 LAB — GLUCOSE, CAPILLARY
Glucose-Capillary: 240 mg/dL — ABNORMAL HIGH (ref 70–99)
Glucose-Capillary: 245 mg/dL — ABNORMAL HIGH (ref 70–99)
Glucose-Capillary: 267 mg/dL — ABNORMAL HIGH (ref 70–99)
Glucose-Capillary: 235 mg/dL — ABNORMAL HIGH (ref 70–99)
Glucose-Capillary: 249 mg/dL — ABNORMAL HIGH (ref 70–99)

## 2020-12-17 MED ORDER — INSULIN ASPART 100 UNIT/ML IJ SOLN
0.0000 [IU] | Freq: Every day | INTRAMUSCULAR | Status: AC
  Administered 2020-12-17: 2 [IU] via SUBCUTANEOUS

## 2020-12-17 MED ORDER — SODIUM CHLORIDE 0.9 % IV BOLUS
1000.0000 mL | Freq: Once | INTRAVENOUS | Status: AC
  Administered 2020-12-17: 1000 mL via INTRAVENOUS

## 2020-12-17 MED ORDER — INSULIN GLARGINE 100 UNIT/ML ~~LOC~~ SOLN
18.0000 [IU] | Freq: Every day | SUBCUTANEOUS | Status: AC
  Administered 2020-12-17: 18 [IU] via SUBCUTANEOUS
  Filled 2020-12-17: qty 0.18

## 2020-12-17 MED ORDER — INSULIN ASPART 100 UNIT/ML IJ SOLN
0.0000 [IU] | Freq: Three times a day (TID) | INTRAMUSCULAR | Status: DC
  Administered 2020-12-17: 5 [IU] via SUBCUTANEOUS
  Administered 2020-12-17: 8 [IU] via SUBCUTANEOUS

## 2020-12-17 NOTE — Progress Notes (Signed)
Physical Therapy Treatment Patient Details Name: Tom Baker MRN: [id number] DOB: 1956-03-28 Today's Date: 12/17/2020    History of Present Illness Tom Baker is a 65 y.o. male who presented to the ER 6/11 status post fall onto right hip.  Now s/p INTRAMEDULLARY (IM) NAIL INTERTROCHANTRIC (Right: Hip) 6/11. He had taken his dog out to go to the bathroom, was standing on the porch talking to his neighbor when he fell onto his right hip. 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 Pt with medical history significant of type 1 diabetes on insulin pump, hypertension, hypothyroidism, hyperlipidemia and BPH.    PT Comments    Pt reports feeling better today, less dizziness with mobility. Pt ambulatory for room distance with close guard to occasional min assist to steady, PT cuing for [facility] but pt states NWB is easier at this time. Pt practiced x1 step navigation, will need to continue to practice tomorrow prior to d/c. PT also educated pt on appropriate LE exercises for strengthening, pt performed well. Pt to continue to follow acutely.    Follow Up Recommendations  Home health PT;Supervision for mobility/OOB     Equipment Recommendations  Rolling walker with 5" wheels;3in1 (PT)    Recommendations for Other Services       Precautions / Restrictions Precautions Precautions: Fall Restrictions Weight Bearing Restrictions: Yes RLE Weight Bearing: Partial weight bearing RLE Partial Weight Bearing Percentage or Pounds: 25%, no ROM restrictions    Mobility  Bed Mobility Overal bed mobility: Needs Assistance Bed Mobility: Supine to Sit     Supine to sit: Min guard     General bed mobility comments: for safety, pt using UEs to translate RLE to EOB. Increased time to perform    Transfers Overall transfer level: Needs assistance Equipment used: Rolling walker (2 wheeled) Transfers: Sit to/from  Stand Sit to Stand: Min guard         General transfer comment: min assist to stand from EOB for power up and bodyweight support when transitioning hands from EOB to RW. Close guard when standing from recliner, cues for scooting forward to EOB.  Ambulation/Gait Ambulation/Gait assistance: Min guard;Min assist Gait Distance (Feet): 15 Feet Assistive device: Rolling walker (2 wheeled) Gait Pattern/deviations: Step-to pattern;Decreased step length - right;Trunk flexed;Antalgic;Decreased weight shift to right Gait velocity: decr   General Gait Details: Min guard for safety, occasional min assist to steady especially during directional changes.   Stairs Stairs: Yes Stairs assistance: Min assist Stair Management: No rails;Step to pattern;Backwards;With walker Number of Stairs: 1 General stair comments: min assist to steady pt and RW, verbal cuing for sequencing task backwards on ascending for better UE support   Wheelchair Mobility    Modified Rankin (Stroke Patients Only)       Balance Overall balance assessment: Needs assistance Sitting-balance support: No upper extremity supported Sitting balance-Leahy Scale: Good     Standing balance support: During functional activity Standing balance-Leahy Scale: Poor Standing balance comment: reliant on external support                            Cognition Arousal/Alertness: Awake/alert Behavior During Therapy: WFL for tasks assessed/performed Overall Cognitive Status: Within Functional Limits for tasks assessed  Exercises General Exercises - Lower Extremity Quad Sets: AROM;Right;10 reps;Seated Long Arc Quad: AROM;20 reps;Right;Seated Hip ABduction/ADduction: AAROM;Right;5 reps;Seated    General Comments        Pertinent Vitals/Pain Pain Assessment: Faces Faces Pain Scale: Hurts little more Pain Location: R hip Pain Descriptors / Indicators:  Discomfort;Operative site guarding;Guarding Pain Intervention(s): Limited activity within patient's tolerance;Monitored during session;Repositioned    Home Living                      Prior Function            PT Goals (current goals can now be found in the care plan section) Acute Rehab PT Goals PT Goal Formulation: With patient Time For Goal Achievement: 12/29/20 Potential to Achieve Goals: Good Progress towards PT goals: Progressing toward goals    Frequency    Min 5X/week      PT Plan Current plan remains appropriate    Co-evaluation              AM-PAC PT "6 Clicks" Mobility   Outcome Measure  Help needed turning from your back to your side while in a flat bed without using bedrails?: A Little Help needed moving from lying on your back to sitting on the side of a flat bed without using bedrails?: A Little Help needed moving to and from a bed to a chair (including a wheelchair)?: A Little Help needed standing up from a chair using your arms (e.g., wheelchair or bedside chair)?: A Little Help needed to walk in hospital room?: A Little Help needed climbing 3-5 steps with a railing? : A Lot 6 Click Score: 17    End of Session Equipment Utilized During Treatment: Gait belt Activity Tolerance: Patient limited by pain;Patient limited by fatigue Patient left: in bed;with call bell/phone within reach;with nursing/sitter in room;with family/visitor present Nurse Communication: Mobility status PT Visit Diagnosis: Unsteadiness on feet (R26.81);Difficulty in walking, not elsewhere classified (R26.2);Pain Pain - Right/Left: Right Pain - part of body: Leg     Time: 5625-6389 PT Time Calculation (min) (ACUTE ONLY): 36 min  Charges:  $Gait Training: 23-37 mins                    Marye Round, PT DPT Acute Rehabilitation Services Pager [phone]  Office [phone]    Tyrone Apple E Christain [place] 12/17/2020, 2:19 PM

## 2020-12-17 NOTE — Progress Notes (Signed)
PROGRESS NOTE    Tom Baker  OIN:[id number] DOB: [birth year] DOA: 12/13/2020 PCP: Tom Reichmann, DO   Brief Narrative: Tom Baker is a 65 y.o. male with a history of diabetes mellitus type 1, hypertension, hyperlipidemia, BPH. Patient presented after a fall, resulting in right hip fracture. No syncope. Orthopedic surgery consulted for surgical repair. Patient underwent IM nail placement on 6/11.   Assessment & Plan:   Principal Problem:   Closed right hip fracture (HCC) Active Problems:   Hypertension   Hypothyroidism   Diabetes mellitus type 1 (HCC)   Hyperlipidemia   AKI (acute kidney injury) (HCC)   BPH (benign prostatic hyperplasia)   Right hip fracture Secondary to fall. Orthopedic surgery consulted and performed IM nail placement on 12/14/20. Orthopedic recommendations for touchdown weight bearing. PT/OT ordered and recommending HHPT.  AKI Previous elevated creatinine in March of 2022 which would be concerning for possible CKD. Currently worsened. Also in setting of worsening anemia. Improved with IV fluids. -Encourage PO fluids  Acute blood loss anemia Secondary to fracture and surgery. EBL per anesthesia note of 300 mL. Baseline hemoglobin of 12.1. Patient with post-op anemia requiring 2 units of PRBC to date. Incision site without evidence of ecchymosis or definite evidence of hematoma. Post-transfusion hemoglobin of 8.7 today -CBC in AM -Transfuse for hemoglobin <7  Diabetes mellitus, type 1 Patient is managed via insulin pump as an outpatient and has elected to continue management via insulin pump while inpatient. Blood sugar continues to be uncontrolled -Hold insulin pump for another 24 hours (updated 6/15) -Increase to Lantus 18 units x1, increase to moderate scale SSI and continue Novolog 4 units TID with meals for 24 hours -Resume insulin pump per patient in 24 hours, discussed with pharmacy as well as diabetic coordinator. Patient is aware.  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 No red flags. Neurosurgery consulted with plans for elective surgery as an outpatient. Patient will need to follow-up with Dr. Danielle Dess.   DVT prophylaxis: Lovenox Code Status:   Code Status: Full Code Family Communication: Wife at bedside Disposition Plan: Discharge likely home likely in 24 hours pending improvement of AKI, stable hemoglobin   Consultants:  Orthopedic surgery  Procedures:  IM NAIL PLACEMENT (12/13/2020)  Antimicrobials: None    Subjective: No issues overnight.  Objective: Vitals:   12/17/20 0200 12/17/20 0201 12/17/20 0354 12/17/20 0826  BP:  (!) 114/59 119/68 (!) 123/58  Pulse: 90 97 93 (!) 101  Resp:   20 18  Temp: 99 F (37.2 C)  98.8 F (37.1 C) 98.9 F (37.2 C)  TempSrc:      SpO2: 97% 97% 100% 100%  Weight:      Height:        Intake/Output Summary (Last 24 hours) at 12/17/2020 1045 Last data filed at 12/17/2020 0354 Gross per 24 hour  Intake 803 ml  Output 1100 ml  Net -297 ml    Filed Weights   12/13/20 1716  Weight: 85.3 kg    Examination:  General exam: Appears calm and comfortable Respiratory system: Clear to auscultation. Respiratory effort normal. Cardiovascular system: S1 & S2 heard, RRR. No murmurs, rubs, gallops or clicks. Gastrointestinal system: Abdomen is nondistended, soft and nontender. No organomegaly or masses felt. Normal bowel sounds heard. Central nervous system: Alert and oriented. No focal neurological  deficits. Musculoskeletal: No edema. No calf tenderness. Incision site is slightly swollen but no definite area concerning for hematoma and no tenderness Skin: No cyanosis. No new rashes Psychiatry: Judgement and insight appear normal. Mood & affect  appropriate.     Data Reviewed: I have personally reviewed following labs and imaging studies  CBC Lab Results  Component Value Date   WBC 7.3 12/17/2020   RBC 2.96 (L) 12/17/2020   HGB 8.7 (L) 12/17/2020   HCT 25.9 (L) 12/17/2020   MCV 87.5 12/17/2020   MCH 29.4 12/17/2020   PLT 175 12/17/2020   MCHC 33.6 12/17/2020   RDW 13.9 12/17/2020     Last metabolic panel Lab Results  Component Value Date   NA 137 12/17/2020   K 4.1 12/17/2020   CL 102 12/17/2020   CO2 25 12/17/2020   BUN 19 12/17/2020   CREATININE 1.55 (H) 12/17/2020   GLUCOSE 306 (H) 12/17/2020   GFRNONAA 49 (L) 12/17/2020   GFRAA 75 07/26/2017   CALCIUM 8.4 (L) 12/17/2020   PROT 5.9 (L) 12/13/2020   ALBUMIN 3.6 12/13/2020   LABGLOB 2.3 07/04/2017   AGRATIO 2.1 07/04/2017   BILITOT 1.2 12/13/2020   ALKPHOS 50 12/13/2020   AST 24 12/13/2020   ALT 16 12/13/2020   ANIONGAP 10 12/17/2020    CBG (last 3)  Recent Labs    12/16/20 2010 12/17/20 0551 12/17/20 0750  GLUCAP 218* 240* 245*      GFR: Estimated Creatinine Clearance: 56.8 mL/min (A) (by C-G formula based on SCr of 1.55 mg/dL (H)).  Coagulation Profile: Recent Labs  Lab 12/14/20 0404  INR 1.3*     Recent Results (from the past 240 hour(s))  Resp Panel by RT-PCR (Flu A&B, Covid) Nasopharyngeal Swab     Status: None   Collection Time: 12/13/20  6:05 PM   Specimen: Nasopharyngeal Swab; Nasopharyngeal(NP) swabs in vial transport medium  Result Value Ref Range Status   SARS Coronavirus 2 by RT PCR NEGATIVE NEGATIVE Final    Comment: (NOTE) SARS-CoV-2 target nucleic acids are NOT DETECTED.  The SARS-CoV-2 RNA is generally detectable in upper respiratory specimens during the acute phase of infection. The lowest concentration of SARS-CoV-2 viral copies this assay can detect is 138 copies/mL. A negative result does not preclude SARS-Cov-2 infection and should not be used as the sole basis for treatment or other patient management  decisions. A negative result may occur with  improper specimen collection/handling, submission of specimen other than nasopharyngeal swab, presence of viral mutation(s) within the areas targeted by this assay, and inadequate number of viral copies(<138 copies/mL). A negative result must be combined with clinical observations, patient history, and epidemiological information. The expected result is Negative.  Fact Sheet for Patients:  BloggerCourse.comhttps://www.fda.gov/media/152166/download  Fact Sheet for Healthcare Providers:  SeriousBroker.ithttps://www.fda.gov/media/152162/download  This test is no t yet approved or cleared by the [place] FDA and  has been authorized for detection and/or diagnosis of SARS-CoV-2 by FDA under an Emergency Use Authorization (EUA). This EUA will remain  in effect (meaning this test can be used) for the duration of the COVID-19 declaration under Section 564(b)(1) of the Act, 21 U.S.C.section 360bbb-3(b)(1), unless the authorization is terminated  or revoked sooner.       Influenza A by PCR NEGATIVE NEGATIVE Final   Influenza B by PCR NEGATIVE NEGATIVE Final    Comment: (NOTE) The Xpert Xpress SARS-CoV-2/FLU/RSV plus assay is intended as an aid in the diagnosis of influenza from Nasopharyngeal swab specimens and  should not be used as a sole basis for treatment. Nasal washings and aspirates are unacceptable for Xpert Xpress SARS-CoV-2/FLU/RSV testing.  Fact Sheet for Patients: BloggerCourse.com  Fact Sheet for Healthcare Providers: SeriousBroker.it  This test is not yet approved or cleared by the [place] FDA and has been authorized for detection and/or diagnosis of SARS-CoV-2 by FDA under an Emergency Use Authorization (EUA). This EUA will remain in effect (meaning this test can be used) for the duration of the COVID-19 declaration under Section 564(b)(1) of the Act, 21 U.S.C. section 360bbb-3(b)(1), unless the  authorization is terminated or revoked.  Performed at [facility], [address] [place], [place] [address]          Radiology Studies: DG Cervical Spine Complete  Result Date: 12/15/2020 CLINICAL DATA:  65 year old male with muscle wasting. EXAM: CERVICAL SPINE - COMPLETE 4+ VIEW COMPARISON:  None. FINDINGS: There is no acute fracture or subluxation of the cervical spine. There is straightening of normal cervical lordosis which may be positional or due to muscle spasm. There is osteopenia with multilevel degenerative changes and spurring. The visualized posterior elements and odontoid appear intact. There is anatomic alignment of the lateral masses of C1 and C2. The soft tissues are unremarkable. IMPRESSION: No acute/traumatic cervical spine pathology. Electronically Signed   By: Elgie Collard M.D.   On: 12/15/2020 19:23   CT CERVICAL SPINE WO CONTRAST  Result Date: 12/16/2020 CLINICAL DATA:  Spinal stenosis EXAM: CT CERVICAL SPINE WITHOUT CONTRAST TECHNIQUE: Multidetector CT imaging of the cervical spine was performed without intravenous contrast. Multiplanar CT image reconstructions were also generated. COMPARISON:  Cervical spine MRI 12/16/2020 FINDINGS: Alignment: Normal. Skull base and vertebrae: No acute fracture. No primary bone lesion or focal pathologic process. Soft tissues and spinal canal: No prevertebral fluid or swelling. No visible canal hematoma. Disc levels: C3-4: Minimal calcification associated with central disc protrusion with endplate spurring. Moderate spinal canal stenosis better characterized on MRI. C4-5: Calcification associated with large central disc protrusion with endplate osteophytes and severe spinal canal stenosis as seen on earlier MRI. C5-6: Calcification associated with large disc herniation with severe spinal canal stenosis. C6-7: Disc bulge and endplate osteophytes with moderate spinal canal stenosis. Upper chest: Negative. Other: None.  IMPRESSION: 1. Severe spinal canal stenosis at C4-5 and C5-6 due to combination of calcified disc protrusions and endplate osteophytes. 2. Moderate spinal canal stenosis at C3-4 and C6-7. 3. No acute fracture or static subluxation of the cervical spine. Electronically Signed   By: Deatra Robinson M.D.   On: 12/16/2020 22:13   MR CERVICAL SPINE WO CONTRAST  Result Date: 12/16/2020 CLINICAL DATA:  Cervical spine stenosis EXAM: MRI CERVICAL SPINE WITHOUT CONTRAST TECHNIQUE: Multiplanar, multisequence MR imaging of the cervical spine was performed. No intravenous contrast was administered. COMPARISON:  None. FINDINGS: Alignment: Multilevel mild retrolisthesis from C2 to C6. Vertebrae: Minor degenerative plate irregularity with small Schmorl's nodes. No substantial marrow edema. No suspicious osseous lesion. Cord: Possible subtle cord T2 hyperintensity at compressed levels. Posterior Fossa, vertebral arteries, paraspinal tissues: Unremarkable. Disc levels: C2-C3:  No canal or foraminal stenosis. C3-C4: Disc bulge with superimposed central protrusion and endplate osteophytes. Uncovertebral hypertrophy. Marked canal stenosis. Moderate right and marked left foraminal stenosis. C4-C5: Disc bulge with superimposed central protrusion and endplate osteophytes. Uncovertebral hypertrophy. Marked canal stenosis with cord compression. Marked foraminal stenosis. C5-C6: Disc bulge slightly eccentric to the right with endplate osteophytes. Uncovertebral hypertrophy. Marked canal stenosis with cord compression. Marked foraminal stenosis. C6-C7:  Disc bulge with endplate osteophytes. Uncovertebral hypertrophy. Marked canal stenosis. Marked foraminal stenosis. C7-T1: Disc bulge with endplate osteophytes. No canal stenosis. Minor foraminal stenosis. IMPRESSION: Multilevel degenerative changes as detailed above. There is significant canal stenosis at several levels, with cord compression at C4-C5 and C5-C6. Marked multilevel neural  foraminal stenosis. Possible small cord T2 hyperintensity at the compressed levels may reflect edema or myelomalacia. Electronically Signed   By: Guadlupe Spanish M.D.   On: 12/16/2020 13:48   VAS [place] ABI WITH/WO TBI  Result Date: 12/17/2020  LOWER EXTREMITY DOPPLER STUDY Patient Name:  MAKELL CYR  Date of Exam:   12/16/2020 Medical Rec #: [id number]    Accession #:    [id number] Date of Birth: Dec 13, 1955    Patient Gender: M Patient Age:   64Y Exam Location:  [facility] Procedure:      VAS [place] ABI WITH/WO TBI Referring Phys: [id number] Montez Morita --------------------------------------------------------------------------------  Indications: Peripheral artery disease. High Risk Factors: Hypertension, hyperlipidemia, Diabetes.  Performing Technologist: Olen Cordial RVT  Examination Guidelines: A complete evaluation includes at minimum, Doppler waveform signals and systolic blood pressure reading at the level of bilateral brachial, anterior tibial, and posterior tibial arteries, when vessel segments are accessible. Bilateral testing is considered an integral part of a complete examination. Photoelectric Plethysmograph (PPG) waveforms and toe systolic pressure readings are included as required and additional duplex testing as needed. Limited examinations for reoccurring indications may be performed as noted.  ABI Findings: +---------+------------------+-----+---------+--------+ Right    Rt Pressure (mmHg)IndexWaveform Comment  +---------+------------------+-----+---------+--------+ Brachial 123                    triphasic         +---------+------------------+-----+---------+--------+ PTA      254               2.05 biphasic          +---------+------------------+-----+---------+--------+ DP       254               2.05 biphasic          +---------+------------------+-----+---------+--------+ Great Toe11                0.09                    +---------+------------------+-----+---------+--------+ +---------+------------------+-----+---------+-------+ Left     Lt Pressure (mmHg)IndexWaveform Comment +---------+------------------+-----+---------+-------+ Brachial 124                    triphasic        +---------+------------------+-----+---------+-------+ PTA      137               1.10 biphasic         +---------+------------------+-----+---------+-------+ DP       120               0.97 biphasic         +---------+------------------+-----+---------+-------+ Great Toe25                0.20                  +---------+------------------+-----+---------+-------+ +-------+-----------+-----------+------------+------------+ ABI/TBIToday's ABIToday's TBIPrevious ABIPrevious TBI +-------+-----------+-----------+------------+------------+ Right  [place]         0.09                                +-------+-----------+-----------+------------+------------+ Left   1.1  0.2                                 +-------+-----------+-----------+------------+------------+  Summary: Right: Resting right ankle-brachial index indicates noncompressible right lower extremity arteries. The right toe-brachial index is abnormal. Left: Resting left ankle-brachial index is within normal range. No evidence of significant left lower extremity arterial disease. The left toe-brachial index is abnormal.  *See table(s) above for measurements and observations.  Electronically signed by Fabienne Bruns MD on 12/17/2020 at 8:47:53 AM.    Final         Scheduled Meds:  sodium chloride   Intravenous Once   sodium chloride   Intravenous Once   acetaminophen  500 mg Oral Q8H   amLODipine  5 mg Oral Daily   vitamin C  1,000 mg Oral Daily   aspirin EC  81 mg Oral QHS   cholecalciferol  2,000 Units Oral BID   docusate sodium  100 mg Oral BID   enoxaparin (LOVENOX) injection  40 mg Subcutaneous Q24H   insulin aspart  0-15 Units  Subcutaneous TID WC   insulin aspart  0-5 Units Subcutaneous QHS   insulin aspart  4 Units Subcutaneous TID WC   insulin glargine  18 Units Subcutaneous Daily   levothyroxine  125 mcg Oral Daily   losartan  100 mg Oral Daily   methocarbamol  750 mg Oral TID   rosuvastatin  5 mg Oral Daily   tadalafil  5 mg Oral Daily   timolol  1 drop Left Eye BID   Continuous Infusions:  methocarbamol (ROBAXIN) IV     sodium chloride       LOS: 4 days     Jacquelin Hawking, MD [facility] 12/17/2020, 10:45 AM  If 7PM-7AM, please contact night-coverage [email or web address]

## 2020-12-17 NOTE — Progress Notes (Signed)
Sat in recliner for over a hour-

## 2020-12-17 NOTE — Plan of Care (Signed)

## 2020-12-17 NOTE — Progress Notes (Signed)
Occupational Therapy Treatment Patient Details Name: Tom Baker MRN: [id number] DOB: 07-19-1955 Today's Date: 12/17/2020    History of present illness Tom Baker is a 65 y.o. male who presented to the ER 6/11 status post fall onto right hip.  Now s/p INTRAMEDULLARY (IM) NAIL INTERTROCHANTRIC (Right: Hip) 6/11. He had taken his dog out to go to the bathroom, was standing on the porch talking to his neighbor when he fell onto his right hip. 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 Pt with medical history significant of type 1 diabetes on insulin pump, hypertension, hypothyroidism, hyperlipidemia and BPH.   OT comments  Tom Baker is progressing very well. Session focused on pt practice with tub transfer bench to prepare from d/c home tomorrow. Tub transfer completed min guard with vc throughout (see details below). All bed mobility, transfers, and ambulation with rw completed at min guard level with increased time. Pt benefits from continued acute OT to  progress ADLs and mobility. D/c remains appropriate.    Follow Up Recommendations  Home health OT;Supervision - Intermittent    Equipment Recommendations  Other (comment);Tub/shower bench       Precautions / Restrictions Precautions Precautions: Fall Restrictions Weight Bearing Restrictions: Yes RLE Weight Bearing: Partial weight bearing RLE Partial Weight Bearing Percentage or Pounds: 25       Mobility Bed Mobility Overal bed mobility: Needs Assistance Bed Mobility: Supine to Sit     Supine to sit: Min guard     General bed mobility comments: + incrased time, use of railing    Transfers Overall transfer level: Needs assistance Equipment used: Rolling walker (2 wheeled);None (tub bench transfer) Transfers: Sit to/from Stand Sit to Stand: Min guard              Balance Overall balance assessment: Needs assistance Sitting-balance  support: No upper extremity supported Sitting balance-Leahy Scale: Good     Standing balance support: During functional activity Standing balance-Leahy Scale: Poor Standing balance comment: reliant on external support             ADL either performed or assessed with clinical judgement   ADL Overall ADL's : Needs assistance/impaired               Tub/ Shower Transfer: Min guard;Cueing for safety;Cueing for sequencing;Tub bench;Ambulation;Rolling walker Tub/Shower Transfer Details (indicate cue type and reason): Visually and verbally demonstrated tub transfer with bench - pt demonstrated great ability to ambulate with rw to tub bench, sit with great control, swing legs into tub & get out safely. Min guard throughout with vc for sequencing. Functional mobility during ADLs: Min guard;Rolling walker;Cueing for safety General ADL Comments: tub transfer this session      Cognition Arousal/Alertness: Awake/alert Behavior During Therapy: WFL for tasks assessed/performed Overall Cognitive Status: Within Functional Limits for tasks assessed       General Comments: pt in great spirits today, excited to go home tomorrow              General Comments no new concerns noted - sitches open to RA    Pertinent Vitals/ Pain       Pain Assessment: Faces Faces Pain Scale: Hurts a little bit Pain Location: R hip Pain Descriptors / Indicators: Discomfort;Operative site guarding;Guarding Pain Intervention(s): Monitored during session  Frequency  Min 2X/week        Progress Toward Goals  OT Goals(current goals can now be found in  the care plan section)  Progress towards OT goals: Progressing toward goals  Acute Rehab OT Goals Patient Stated Goal: to practice stairs OT Goal Formulation: With patient Time For Goal Achievement: 12/28/20 Potential to Achieve Goals: Good ADL Goals Pt Will Perform Lower Body Bathing: with supervision;sit to/from stand Pt Will Perform Lower  Body Dressing: with supervision;sit to/from stand;sitting/lateral leans Pt Will Transfer to Toilet: with min guard assist;ambulating Pt Will Perform Toileting - Clothing Manipulation and hygiene: Independently Additional ADL Goal #1: Pt will demonstrate independent ability to maintain RLE WB status during functional ADL task.  Plan Discharge plan remains appropriate       AM-PAC OT "6 Clicks" Daily Activity     Outcome Measure   Help from another person eating meals?: None Help from another person taking care of personal grooming?: A Little Help from another person toileting, which includes using toliet, bedpan, or urinal?: A Little Help from another person bathing (including washing, rinsing, drying)?: A Little Help from another person to put on and taking off regular upper body clothing?: A Little Help from another person to put on and taking off regular lower body clothing?: A Lot 6 Click Score: 18    End of Session Equipment Utilized During Treatment: Gait belt;Rolling walker (tub bench)  OT Visit Diagnosis: Unsteadiness on feet (R26.81);History of falling (Z91.81);Pain Pain - Right/Left: Right Pain - part of body: Hip   Activity Tolerance Patient tolerated treatment well   Patient Left in chair;with call bell/phone within reach   Nurse Communication Mobility status        Time: 1640-1706 OT Time Calculation (min): 26 min  Charges: OT General Charges $OT Visit: 1 Visit OT Treatments $Self Care/Home Management : 23-37 mins     Kristien Salatino A Mercia Dowe 12/17/2020, 5:21 PM

## 2020-12-17 NOTE — Plan of Care (Signed)

## 2020-12-17 NOTE — Progress Notes (Signed)
Last evening HGB-6.8 ordered and transfused 1 unit PRBC- no difficulties at this time- post h/h ordered for this morning

## 2020-12-17 NOTE — Progress Notes (Signed)
Secure chat to discuss plan of basal insulin with Dr. Caleb Popp. Lantus reordered and changes made per Dr. Caleb Popp.  Thank you, Billy Fischer. Meisha Salone, RN, MSN, CDE  Diabetes Coordinator Inpatient Glycemic Control Team Team Pager [phone] (8am-5pm) 12/17/2020 10:02 AM

## 2020-12-17 NOTE — Progress Notes (Signed)
Orthopaedic Trauma Service Progress Note  Patient ID: Tom Baker MRN: [id number] DOB/AGE: 66-Dec-1957 66 y.o.  Subjective:  Doing well Pain controlled No new issues  Received 1 unit PRBCs overnight    MRI of c-spine completed. 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  Greatly appreciate consult by Dr. Danielle Dess  Pt will also need outpt vascular follow up for abnormal R leg ABI    ROS As above  Objective:   VITALS:   Vitals:   12/17/20 0200 12/17/20 0201 12/17/20 0354 12/17/20 0826  BP:  (!) 114/59 119/68 (!) 123/58  Pulse: 90 97 93 (!) 101  Resp:   20 18  Temp: 99 F (37.2 C)  98.8 F (37.1 C) 98.9 F (37.2 C)  TempSrc:      SpO2: 97% 97% 100% 100%  Weight:      Height:        Estimated body mass index is 23.5 kg/m as calculated from the following:   Height as of this encounter: 6\' 3"  (1.905 m).   Weight as of this encounter: 85.3 kg.   Intake/Output      06/14 0701 06/15 0700 06/15 0701 06/16 0700   P.O. 720    Blood 323    Total Intake(mL/kg) 1043 (12.2)    Urine (mL/kg/hr) 1600 (0.8)    Total Output 1600    Net -557           LABS  Results for orders placed or performed during the hospital encounter of 12/13/20 (from the past 24 hour(s))  Hemoglobin and hematocrit, blood     Status: Abnormal   Collection Time: 12/16/20 11:07 AM  Result Value Ref Range   Hemoglobin 7.4 (L) 13.0 - 17.0 g/dL   HCT 12/18/20 (L) 75.6 - 43.3 %  Glucose, capillary     Status: Abnormal   Collection Time: 12/16/20 11:17 AM  Result Value Ref Range   Glucose-Capillary 279 (H) 70 - 99 mg/dL  Glucose, capillary     Status: Abnormal   Collection Time: 12/16/20  5:23 PM  Result Value Ref Range   Glucose-Capillary 154 (H) 70 - 99 mg/dL  Hemoglobin and hematocrit, blood     Status: Abnormal   Collection Time: 12/16/20  7:55 PM  Result Value Ref  Range   Hemoglobin 6.8 (LL) 13.0 - 17.0 g/dL   HCT 12/18/20 (L) 18.8 - 41.6 %  Glucose, capillary     Status: Abnormal   Collection Time: 12/16/20  8:10 PM  Result Value Ref Range   Glucose-Capillary 218 (H) 70 - 99 mg/dL  Prepare RBC (crossmatch)     Status: None   Collection Time: 12/16/20 10:49 PM  Result Value Ref Range   Order Confirmation      ORDERS RECEIVED TO CROSSMATCH Performed at [facility], [address], [place], [place]   Type and screen [facility]     Status: None (Preliminary result)   Collection Time: 12/16/20 11:30 PM  Result Value Ref Range   ABO/RH(D) A POS    Antibody Screen NEG    Sample Expiration 12/19/2020,2359    Unit Number 12/21/2020    Blood Component Type RED CELLS,LR    Unit division 00    Status of Unit ISSUED  Transfusion Status OK TO TRANSFUSE    Crossmatch Result      Compatible Performed at [facility], [address] [place], [place] [address]   CBC     Status: Abnormal   Collection Time: 12/17/20  4:33 AM  Result Value Ref Range   WBC 7.3 4.0 - 10.5 K/uL   RBC 2.96 (L) 4.22 - 5.81 MIL/uL   Hemoglobin 8.7 (L) 13.0 - 17.0 g/dL   HCT 54.0 (L) 08.6 - 76.1 %   MCV 87.5 80.0 - 100.0 fL   MCH 29.4 26.0 - 34.0 pg   MCHC 33.6 30.0 - 36.0 g/dL   RDW 95.0 93.2 - 67.1 %   Platelets 175 150 - 400 K/uL   nRBC 0.0 0.0 - 0.2 %  Basic metabolic panel     Status: Abnormal   Collection Time: 12/17/20  4:33 AM  Result Value Ref Range   Sodium 137 135 - 145 mmol/L   Potassium 4.1 3.5 - 5.1 mmol/L   Chloride 102 98 - 111 mmol/L   CO2 25 22 - 32 mmol/L   Glucose, Bld 306 (H) 70 - 99 mg/dL   BUN 19 8 - 23 mg/dL   Creatinine, Ser 2.45 (H) 0.61 - 1.24 mg/dL   Calcium 8.4 (L) 8.9 - 10.3 mg/dL   GFR, Estimated 49 (L) >60 mL/min   Anion gap 10 5 - 15  Glucose, capillary     Status: Abnormal   Collection Time: 12/17/20  5:51 AM  Result Value Ref Range   Glucose-Capillary 240 (H) 70 - 99 mg/dL  Glucose,  capillary     Status: Abnormal   Collection Time: 12/17/20  7:50 AM  Result Value Ref Range   Glucose-Capillary 245 (H) 70 - 99 mg/dL     PHYSICAL EXAM:   Gen: resting comfortably in bed, NAD, appears well  Lungs: unlabored Cardiac: reg Ext:       Right Lower Extremity             Dressings R hip c/d/I   Dressings removed   All incisions look fantastic   Dressings left off  + DP pulse             Ext cool but symmetric             Distal motor and sensory functions appear to be grossly intact             No DCT             Compartments are soft             Minimal swelling             Skin changes consistent with chronic PVD noted B                   B upper extremities             UEx exam per Dr. Danielle Dess   Assessment/Plan: 4 Days Post-Op   Principal Problem:   Closed right hip fracture (HCC) Active Problems:   Hypertension   Hypothyroidism   Diabetes mellitus type 1 (HCC)   Hyperlipidemia   AKI (acute kidney injury) (HCC)   BPH (benign prostatic hyperplasia)   Anti-infectives (From admission, onward)    Start     Dose/Rate Route Frequency Ordered Stop   12/14/20 0300  ceFAZolin (ANCEF) IVPB 2g/100 mL premix        2 g 200 mL/hr over  30 Minutes Intravenous Every 6 hours 12/14/20 0150 12/14/20 1212   12/13/20 1900  ceFAZolin (ANCEF) IVPB 2g/100 mL premix        2 g 200 mL/hr over 30 Minutes Intravenous To Surgery 12/13/20 1829 12/13/20 2131     .  POD/HD#: 71   65 y/o male s/p fall with R subtrochanteric femur fracture    -fall   - R subtrochanteric femur fracture s/p IMN             PWB R leg (25%) with assistance             ROM as tolerated R hip and knee             Ice prn             Dressing changes as needed   Ok to leave dressing off    Ok to shower and clean wounds with soap and water              PT/OT   ? If his longstanding cervical spine pathology has lead to decreased BMD    - B intrinsic hand muscle wasting--> cervical  spondylosis with myelopathy and cord compression              appreciate consult by Dr. Danielle Dess   Outpt follow up to discuss surgical intervention   - abnormal R leg abi   Outpt vascular eval   - Pain management:             Multimodal    - ABL anemia/Hemodynamics            stable   Monitor   - Medical issues              Per primary    - DVT/PE prophylaxis:             Lovenox for now             Will likely dc on xarelto   - ID:              Periop abx completed    - Metabolic Bone Disease:             Vitamin d deficiency                         Supplement   - FEN/GI prophylaxis/Foley/Lines:             Carb mod diet   - Impediments to fracture healing:             DM             Vitamin d deficiency - Dispo:            Ortho issues stable  Think he would be fine to dc tomorrow    Mearl Latin, PA-C [phone] (C) 12/17/2020, 9:58 AM  [facility] [address] [place] [address] [phone] [phone] (F)    After 5pm and on the weekends please log on to Amion, go to orthopaedics and the look under the [facility] Call for the provider(s) on call. You can also call our office at [phone] and then follow the prompts to be connected to the call team.

## 2020-12-18 ENCOUNTER — Other Ambulatory Visit (HOSPITAL_COMMUNITY): Payer: Self-pay

## 2020-12-18 LAB — TYPE AND SCREEN
ABO/RH(D): A POS
Antibody Screen: NEGATIVE
Unit division: 0

## 2020-12-18 LAB — BASIC METABOLIC PANEL
Anion gap: 7 (ref 5–15)
BUN: 15 mg/dL (ref 8–23)
CO2: 26 mmol/L (ref 22–32)
Calcium: 8.5 mg/dL — ABNORMAL LOW (ref 8.9–10.3)
Chloride: 104 mmol/L (ref 98–111)
Creatinine, Ser: 1.33 mg/dL — ABNORMAL HIGH (ref 0.61–1.24)
GFR, Estimated: 59 mL/min — ABNORMAL LOW (ref >60)
Glucose, Bld: 211 mg/dL — ABNORMAL HIGH (ref 70–99)
Potassium: 3.7 mmol/L (ref 3.5–5.1)
Sodium: 137 mmol/L (ref 135–145)

## 2020-12-18 LAB — CBC
HCT: 24.8 % — ABNORMAL LOW (ref 39.0–52.0)
Hemoglobin: 8.4 g/dL — ABNORMAL LOW (ref 13.0–17.0)
MCH: 29.7 pg (ref 26.0–34.0)
MCHC: 33.9 g/dL (ref 30.0–36.0)
MCV: 87.6 fL (ref 80.0–100.0)
Platelets: 204 10*3/uL (ref 150–400)
RBC: 2.83 MIL/uL — ABNORMAL LOW (ref 4.22–5.81)
RDW: 13.9 % (ref 11.5–15.5)
WBC: 5.2 10*3/uL (ref 4.0–10.5)
nRBC: 0 % (ref 0.0–0.2)

## 2020-12-18 LAB — BPAM RBC
Blood Product Expiration Date: 202207042359
ISSUE DATE / TIME: 202206150128
Unit Type and Rh: 6200

## 2020-12-18 LAB — GLUCOSE, CAPILLARY
Glucose-Capillary: 233 mg/dL — ABNORMAL HIGH (ref 70–99)
Glucose-Capillary: 248 mg/dL — ABNORMAL HIGH (ref 70–99)
Glucose-Capillary: 239 mg/dL — ABNORMAL HIGH (ref 70–99)

## 2020-12-18 MED ORDER — ROSUVASTATIN CALCIUM 5 MG PO TABS: 5.0000 mg | ORAL_TABLET | Freq: Every day | ORAL | Status: DC

## 2020-12-18 MED ORDER — HYDROCODONE-ACETAMINOPHEN 5-325 MG PO TABS
1.0000 | ORAL_TABLET | Freq: Three times a day (TID) | ORAL | 0 refills | Status: DC | PRN
  Filled 2020-12-18: qty 30, 5d supply, fill #0

## 2020-12-18 MED ORDER — INSULIN PUMP
Freq: Three times a day (TID) | SUBCUTANEOUS | Status: DC
  Filled 2020-12-18: qty 1

## 2020-12-18 MED ORDER — RIVAROXABAN 10 MG PO TABS
10.0000 mg | ORAL_TABLET | Freq: Every day | ORAL | 0 refills | Status: DC
  Filled 2020-12-18: qty 30, 30d supply, fill #0

## 2020-12-18 MED ORDER — DOCUSATE SODIUM 100 MG PO CAPS: 100.0000 mg | ORAL_CAPSULE | Freq: Two times a day (BID) | ORAL | 0 refills | Status: DC

## 2020-12-18 MED ORDER — METHOCARBAMOL 750 MG PO TABS
750.0000 mg | ORAL_TABLET | Freq: Three times a day (TID) | ORAL | 0 refills | Status: DC | PRN
  Filled 2020-12-18: qty 20, 7d supply, fill #0

## 2020-12-18 MED ORDER — CHOLECALCIFEROL 125 MCG (5000 UT) PO TABS
1.0000 | ORAL_TABLET | Freq: Every day | ORAL | 6 refills | Status: AC
  Filled 2020-12-18: qty 30, 30d supply, fill #0

## 2020-12-18 MED ORDER — VITAMIN D3 25 MCG PO TABS
2000.0000 [IU] | ORAL_TABLET | Freq: Two times a day (BID) | ORAL | 1 refills | Status: DC
  Filled 2020-12-18: qty 120, 30d supply, fill #0

## 2020-12-18 MED ORDER — ACETAMINOPHEN 500 MG PO TABS: 500.0000 mg | ORAL_TABLET | Freq: Three times a day (TID) | ORAL | 0 refills | Status: AC

## 2020-12-18 MED ORDER — ASCORBIC ACID 1000 MG PO TABS: 1000.0000 mg | ORAL_TABLET | Freq: Every day | ORAL | Status: DC

## 2020-12-18 NOTE — Discharge Instructions (Signed)
Orthopaedic Trauma Service Discharge Instructions   General Discharge Instructions  Orthopaedic Injuries:  Right femur fracture treated with intramedullary nailing  WEIGHT BEARING STATUS: Partial weightbearing, approximately 25% of body weight, right leg.  Use walker or crutches to mobilize  RANGE OF MOTION/ACTIVITY: Unrestricted range of motion right hip and knee.  Activity as tolerated while maintaining weightbearing restrictions  Bone health: Labs show vitamin D deficiency.  Please take vitamin D supplements to promote good healing and good bone health.  Wound Care: Okay to leave incisions open to the air.  Clean with soap and water only.  Please see instructions below  DVT/PE prophylaxis: Xarelto 10 mg daily for the next 4 weeks   Discharge Wound Care Instructions  Do NOT apply any ointments, solutions or lotions to pin sites or surgical wounds.  These prevent needed drainage and even though solutions like hydrogen peroxide kill bacteria, they also damage cells lining the pin sites that help fight infection.  Applying lotions or ointments can keep the wounds moist and can cause them to breakdown and open up as well. This can increase the risk for infection. When in doubt call the office.  Surgical incisions should be dressed daily.  If any drainage is noted, use one layer of adaptic, then gauze, Kerlix, and an ace wrap.  Once the incision is completely dry and without drainage, it may be left open to air out.  Showering may begin 36-48 hours later.  Cleaning gently with soap and water.  Traumatic wounds should be dressed daily as well.    One layer of adaptic, gauze, Kerlix, then ace wrap.  The adaptic can be discontinued once the draining has ceased    If you have a wet to dry dressing: wet the gauze with saline the squeeze as much saline out so the gauze is moist (not soaking wet), place moistened gauze over wound, then place a dry gauze over the moist one, followed by  Kerlix wrap, then ace wrap.    Diet: as you were eating previously.  Can use over the counter stool softeners and bowel preparations, such as Miralax, to help with bowel movements.  Narcotics can be constipating.  Be sure to drink plenty of fluids  PAIN MEDICATION USE AND EXPECTATIONS  You have likely been given narcotic medications to help control your pain.  After a traumatic event that results in an fracture (broken bone) with or without surgery, it is ok to use narcotic pain medications to help control one's pain.  We understand that everyone responds to pain differently and each individual patient will be evaluated on a regular basis for the continued need for narcotic medications. Ideally, narcotic medication use should last no more than 6-8 weeks (coinciding with fracture healing).   As a patient it is your responsibility as well to monitor narcotic medication use and report the amount and frequency you use these medications when you come to your office visit.   We would also advise that if you are using narcotic medications, you should take a dose prior to therapy to maximize you participation.  IF YOU ARE ON NARCOTIC MEDICATIONS IT IS NOT PERMISSIBLE TO OPERATE A MOTOR VEHICLE (MOTORCYCLE/CAR/TRUCK/MOPED) OR HEAVY MACHINERY DO NOT MIX NARCOTICS WITH OTHER CNS (CENTRAL NERVOUS SYSTEM) DEPRESSANTS SUCH AS ALCOHOL   POST-OPERATIVE OPIOID TAPER INSTRUCTIONS: It is important to wean off of your opioid medication as soon as possible. If you do not need pain medication after your surgery it is ok to stop day  one. Opioids include: Codeine, Hydrocodone(Norco, Vicodin), Oxycodone(Percocet, oxycontin) and hydromorphone amongst others.  Long term and even short term use of opiods can cause: Increased pain response Dependence Constipation Depression Respiratory depression And more.  Withdrawal symptoms can include Flu like symptoms Nausea, vomiting And more Techniques to manage these  symptoms Hydrate well Eat regular healthy meals Stay active Use relaxation techniques(deep breathing, meditating, yoga) Do Not substitute Alcohol to help with tapering If you have been on opioids for less than two weeks and do not have pain than it is ok to stop all together.  Plan to wean off of opioids This plan should start within one week post op of your fracture surgery  Maintain the same interval or time between taking each dose and first decrease the dose.  Cut the total daily intake of opioids by one tablet each day Next start to increase the time between doses. The last dose that should be eliminated is the evening dose.    STOP SMOKING OR USING NICOTINE PRODUCTS!!!!  As discussed nicotine severely impairs your body's ability to heal surgical and traumatic wounds but also impairs bone healing.  Wounds and bone heal by forming microscopic blood vessels (angiogenesis) and nicotine is a vasoconstrictor (essentially, shrinks blood vessels).  Therefore, if vasoconstriction occurs to these microscopic blood vessels they essentially disappear and are unable to deliver necessary nutrients to the healing tissue.  This is one modifiable factor that you can do to dramatically increase your chances of healing your injury.    (This means no smoking, no nicotine gum, patches, etc)  DO NOT USE NONSTEROIDAL ANTI-INFLAMMATORY DRUGS (NSAID'S)  Using products such as Advil (ibuprofen), Aleve (naproxen), Motrin (ibuprofen) for additional pain control during fracture healing can delay and/or prevent the healing response.  If you would like to take over the counter (OTC) medication, Tylenol (acetaminophen) is ok.  However, some narcotic medications that are given for pain control contain acetaminophen as well. Therefore, you should not exceed more than 4000 mg of tylenol in a day if you do not have liver disease.  Also note that there are may OTC medicines, such as cold medicines and allergy medicines that my  contain tylenol as well.  If you have any questions about medications and/or interactions please ask your doctor/PA or your pharmacist.      ICE AND ELEVATE INJURED/OPERATIVE EXTREMITY  Using ice and elevating the injured extremity above your heart can help with swelling and pain control.  Icing in a pulsatile fashion, such as 20 minutes on and 20 minutes off, can be followed.    Do not place ice directly on skin. Make sure there is a barrier between to skin and the ice pack.    Using frozen items such as frozen peas works well as the conform nicely to the are that needs to be iced.  USE AN ACE WRAP OR TED HOSE FOR SWELLING CONTROL  In addition to icing and elevation, Ace wraps or TED hose are used to help limit and resolve swelling.  It is recommended to use Ace wraps or TED hose until you are informed to stop.    When using Ace Wraps start the wrapping distally (farthest away from the body) and wrap proximally (closer to the body)   Example: If you had surgery on your leg or thing and you do not have a splint on, start the ace wrap at the toes and work your way up to the thigh  If you had surgery on your upper extremity and do not have a splint on, start the ace wrap at your fingers and work your way up to the upper arm  IF YOU ARE IN A SPLINT OR CAST DO NOT REMOVE IT FOR ANY REASON   If your splint gets wet for any reason please contact the office immediately. You may shower in your splint or cast as long as you keep it dry.  This can be done by wrapping in a cast cover or garbage back (or similar)  Do Not stick any thing down your splint or cast such as pencils, money, or hangers to try and scratch yourself with.  If you feel itchy take benadryl as prescribed on the bottle for itching  IF YOU ARE IN A CAM BOOT (BLACK BOOT)  You may remove boot periodically. Perform daily dressing changes as noted below.  Wash the liner of the boot regularly and wear a sock when wearing the boot. It is  recommended that you sleep in the boot until told otherwise    Call office for the following: Temperature greater than 101F Persistent nausea and vomiting Severe uncontrolled pain Redness, tenderness, or signs of infection (pain, swelling, redness, odor or green/yellow discharge around the site) Difficulty breathing, headache or visual disturbances Hives Persistent dizziness or light-headedness Extreme fatigue Any other questions or concerns you may have after discharge  In an emergency, call 911 or go to an Emergency Department at a nearby hospital  HELPFUL INFORMATION  If you had a block, it will wear off between 8-24 hrs postop typically.  This is period when your pain may go from nearly zero to the pain you would have had postop without the block.  This is an abrupt transition but nothing dangerous is happening.  You may take an extra dose of narcotic when this happens.  You should wean off your narcotic medicines as soon as you are able.  Most patients will be off or using minimal narcotics before their first postop appointment.   We suggest you use the pain medication the first night prior to going to bed, in order to ease any pain when the anesthesia wears off. You should avoid taking pain medications on an empty stomach as it will make you nauseous.  Do not drink alcoholic beverages or take illicit drugs when taking pain medications.  In most states it is against the law to drive while you are in a splint or sling.  And certainly against the law to drive while taking narcotics.  You may return to work/school in the next couple of days when you feel up to it.   Pain medication may make you constipated.  Below are a few solutions to try in this order: Decrease the amount of pain medication if you aren't having pain. Drink lots of decaffeinated fluids. Drink prune juice and/or each dried prunes  If the first 3 don't work start with additional solutions Take Colace - an  over-the-counter stool softener Take Senokot - an over-the-counter laxative Take Miralax - a stronger over-the-counter laxative     CALL THE OFFICE WITH ANY QUESTIONS OR CONCERNS: [phone]   VISIT OUR WEBSITE FOR ADDITIONAL INFORMATION: [email or web address]

## 2020-12-18 NOTE — Discharge Summary (Signed)
Physician Discharge Summary  Tom Baker TWS:[id number] DOB: 1956/04/12 DOA: 12/13/2020  PCP: Irena Reichmann, DO  Admit date: 12/13/2020 Discharge date: 12/18/2020  Admitted From: home Discharge disposition: home   Recommendations for Outpatient Follow-Up:   CBC 1 week Vitamin D replacement FMLA paperwork filled out Home health NS outpatient follow up   Discharge Diagnosis:   Principal Problem:   Closed right hip fracture (HCC) Active Problems:   Hypertension   Hypothyroidism   Diabetes mellitus type 1 (HCC)   Hyperlipidemia   AKI (acute kidney injury) (HCC)   BPH (benign prostatic hyperplasia)    Discharge Condition: Improved.  Diet recommendation: Carbohydrate-modified.    Wound care: Dressing changes as needed                         Ok to leave dressing off                         Ok to shower and clean wounds with soap and water  Code status: Full.   History of Present Illness:   Tom Baker is a 65 y.o. male with medical history significant of type 1 diabetes on insulin pump, hypertension, hypothyroidism, hyperlipidemia and BPH who presented to the ER status post fall onto right hip.  He had taken his dog out to go to the bathroom and was standing on the porch talking to his neighbor when he got caught up trying to get his dog and fell onto his right hip.  He had immediate pain in right hip post fall, rated 10/10. Pain was severe and constant and not radiating.  He has movement and sensation in his entire leg down to his toes.  Denies any weakness or tingling.  He can not bear weight. He takes a baby aspirin daily.  He is overall healthy and feeling well specifically denies fever chills, recent infection, chest pain palpitations, nausea vomiting diarrhea or abdominal pain   Hospital Course by Problem:   Right hip fracture Secondary to fall. Orthopedic surgery consulted and performed IM nail placement on 12/14/20. Orthopedic recommendations for touchdown  weight bearing. PT/OT ordered and recommending HHPT.   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 No red flags. Neurosurgery consulted with plans for elective surgery as an outpatient. Patient will need to follow-up with Dr. Danielle Dess.    Medical Consultants:   Ortho NS   Discharge Exam:   Vitals:   12/17/20 2045 12/18/20 0700  BP: 111/61 106/67  Pulse: 100 92  Resp: 16 18  Temp: 98.7 F (37.1 C) 98.8 F (37.1 C)  SpO2: 98% 100%   Vitals:   12/17/20 0826 12/17/20 1700 12/17/20 2045 12/18/20 0700  BP: (!) 123/58 116/68 111/61 106/67  Pulse: (!) 101 97 100 92  Resp: 18 18 16 18   Temp: 98.9 F (37.2 C) 99.1 F (37.3 C) 98.7 F (37.1 C) 98.8 F (37.1 C)  TempSrc:  Oral  Oral  SpO2: 100% 100% 98% 100%  Weight:      Height:        General exam: Appears calm and comfortable.   The results of significant diagnostics  from this hospitalization (including imaging, microbiology, ancillary and laboratory) are listed below for reference.     Procedures and Diagnostic Studies:   DG Chest Port 1 View  Result Date: 12/13/2020 CLINICAL DATA:  Right hip fracture. EXAM: PORTABLE CHEST 1 VIEW COMPARISON:  CTA chest dated December 13, 2015. Chest x-ray dated December 12, 2015. FINDINGS: The heart size and mediastinal contours are within normal limits. Both lungs are clear. The visualized skeletal structures are unremarkable. IMPRESSION: No active disease. Electronically Signed   By: Obie Dredge M.D.   On: 12/13/2020 18:01   DG C-Arm 1-60 Min  Result Date: 12/14/2020 CLINICAL DATA:  Right femoral intramedullary nail placement EXAM: RIGHT FEMUR 2 VIEWS; DG C-ARM 1-60 MIN  COMPARISON:  Radiographs 12/13/2020 FINDINGS: Sequential fluoroscopic images depict interval placement of an intramedullary nail with proximal transcervical fixation screws and distal fixation screws in the distal femoral metadiaphysis. Intramedullary nail transfixes the proximal subtrochanteric femur fracture diminished displacement an associated butterfly fragment as well. No acute complication. IMPRESSION: Intraoperative fluoroscopy during placement of an intramedullary nail for fixation a subtrochanteric femur fracture. No acute complication. See operative report for further details. Electronically Signed   By: Kreg Shropshire M.D.   On: 12/14/2020 02:23   DG Hip Port [place] W or [place] Pelvis 1 View Right  Result Date: 12/13/2020 CLINICAL DATA:  Fall. EXAM: DG HIP (WITH OR WITHOUT PELVIS) 1V PORT RIGHT COMPARISON:  None. FINDINGS: Acute displaced comminuted right subtrochanteric femur fracture. Mild bilateral hip osteoarthritis. The pubic symphysis and sacroiliac joints are intact. IMPRESSION: 1. Acute displaced right subtrochanteric femur fracture. Electronically Signed   By: Obie Dredge M.D.   On: 12/13/2020 18:00   DG FEMUR, MIN 2 VIEWS RIGHT  Result Date: 12/14/2020 CLINICAL DATA:  Right femoral intramedullary nail placement EXAM: RIGHT FEMUR 2 VIEWS; DG C-ARM 1-60 MIN COMPARISON:  Radiographs 12/13/2020 FINDINGS: Sequential fluoroscopic images depict interval placement of an intramedullary nail with proximal transcervical fixation screws and distal fixation screws in the distal femoral metadiaphysis. Intramedullary nail transfixes the proximal subtrochanteric femur fracture diminished displacement an associated butterfly fragment as well. No acute complication. IMPRESSION: Intraoperative fluoroscopy during placement of an intramedullary nail for fixation a subtrochanteric femur fracture. No acute complication. See operative report for further details. Electronically Signed   By: Kreg Shropshire M.D.    On: 12/14/2020 02:23   DG FEMUR PORT, MIN 2 VIEWS RIGHT  Result Date: 12/14/2020 CLINICAL DATA:  65 year old male postop repair of femur fracture EXAM: RIGHT FEMUR PORTABLE 2 VIEW COMPARISON:  12/13/2020 FINDINGS: Interval surgical changes of fixation of right-sided sub trochanteric hip fracture with antegrade intramedullary rod, parallel cannulated screw femoral neck fixation, and distal interlocking screw fixation. Alignment maintained at the fracture site. Expected soft tissue changes surrounding the hip and distal thigh including some subcutaneous gas. Surgical changes in the pelvis.  Degenerative changes of the hips. IMPRESSION: Interval surgical changes of ORIF of right subtrochanteric hip fracture as above. Electronically Signed   By: Gilmer Mor D.O.   On: 12/14/2020 09:45   DG FEMUR PORT, MIN 2 VIEWS RIGHT  Result Date: 12/13/2020 CLINICAL DATA:  Right proximal femoral fracture. EXAM: RIGHT FEMUR PORTABLE 2 VIEW COMPARISON:  None. FINDINGS: The distal femur is intact.  No other fractures identified. IMPRESSION: The distal femur is intact with no other fractures. Electronically Signed   By: Gerome Sam III M.D   On: 12/13/2020 18:52     Labs:   Basic Metabolic Panel: Recent Labs  Lab 12/14/20  1610 12/15/20 0444 12/16/20 0325 12/17/20 0433 12/18/20 0336  NA 137 136 136 137 137  K 4.3 4.5 3.9 4.1 3.7  CL 105 105 105 102 104  CO2 GLUCOSE 242* 250* 177* 306* 211*  BUN 22 30* CREATININE 1.59* 1.75* 1.51* 1.55* 1.33*  CALCIUM 8.2* 8.5* 8.1* 8.4* 8.5*   GFR Estimated Creatinine Clearance: 66.2 mL/min (A) (by C-G formula based on SCr of 1.33 mg/dL (H)). Liver Function Tests: Recent Labs  Lab 12/13/20 1718  AST 24  ALT 16  ALKPHOS 50  BILITOT 1.2  PROT 5.9*  ALBUMIN 3.6   No results for input(s): LIPASE, AMYLASE in the last 168 hours. No results for input(s): AMMONIA in the last 168 hours. Coagulation profile Recent Labs  Lab  12/14/20 0404  INR 1.3*    CBC: Recent Labs  Lab 12/14/20 0404 12/15/20 0444 12/15/20 1535 12/16/20 0325 12/16/20 1107 12/16/20 1955 12/17/20 0433 12/18/20 0336  WBC 8.9 6.6  --  5.9  --   --  7.3 5.2  HGB 8.5* 7.4*   < > 7.2* 7.4* 6.8* 8.7* 8.4*  HCT 26.3* 21.7*   < > 21.8* 22.6* 20.4* 25.9* 24.8*  MCV 88.3 85.8  --  86.5  --   --  87.5 87.6  PLT 175 153  --  140*  --   --  175 204   < > = values in this interval not displayed.   Cardiac Enzymes: No results for input(s): CKTOTAL, CKMB, CKMBINDEX, TROPONINI in the last 168 hours. BNP: Invalid input(s): POCBNP CBG: Recent Labs  Lab 12/17/20 1145 12/17/20 1734 12/17/20 1944 12/18/20 0630 12/18/20 0715  GLUCAP 267* 235* 249* 248* 233*   D-Dimer No results for input(s): DDIMER in the last 72 hours. Hgb A1c No results for input(s): HGBA1C in the last 72 hours. Lipid Profile No results for input(s): CHOL, HDL, LDLCALC, TRIG, CHOLHDL, LDLDIRECT in the last 72 hours. Thyroid function studies No results for input(s): TSH, T4TOTAL, T3FREE, THYROIDAB in the last 72 hours.  Invalid input(s): FREET3 Anemia work up No results for input(s): VITAMINB12, FOLATE, FERRITIN, TIBC, IRON, RETICCTPCT in the last 72 hours. Microbiology Recent Results (from the past 240 hour(s))  Resp Panel by RT-PCR (Flu A&B, Covid) Nasopharyngeal Swab     Status: None   Collection Time: 12/13/20  6:05 PM   Specimen: Nasopharyngeal Swab; Nasopharyngeal(NP) swabs in vial transport medium  Result Value Ref Range Status   SARS Coronavirus 2 by RT PCR NEGATIVE NEGATIVE Final    Comment: (NOTE) SARS-CoV-2 target nucleic acids are NOT DETECTED.  The SARS-CoV-2 RNA is generally detectable in upper respiratory specimens during the acute phase of infection. The lowest concentration of SARS-CoV-2 viral copies this assay can detect is 138 copies/mL. A negative result does not preclude SARS-Cov-2 infection and should not be used as the sole basis for  treatment or other patient management decisions. A negative result may occur with  improper specimen collection/handling, submission of specimen other than nasopharyngeal swab, presence of viral mutation(s) within the areas targeted by this assay, and inadequate number of viral copies(<138 copies/mL). A negative result must be combined with clinical observations, patient history, and epidemiological information. The expected result is Negative.  Fact Sheet for Patients:  BloggerCourse.com  Fact Sheet for Healthcare Providers:  SeriousBroker.it  This test is no t yet approved or cleared by the [place] FDA and  has been authorized for detection and/or diagnosis of  SARS-CoV-2 by FDA under an Emergency Use Authorization (EUA). This EUA will remain  in effect (meaning this test can be used) for the duration of the COVID-19 declaration under Section 564(b)(1) of the Act, 21 U.S.C.section 360bbb-3(b)(1), unless the authorization is terminated  or revoked sooner.       Influenza A by PCR NEGATIVE NEGATIVE Final   Influenza B by PCR NEGATIVE NEGATIVE Final    Comment: (NOTE) The Xpert Xpress SARS-CoV-2/FLU/RSV plus assay is intended as an aid in the diagnosis of influenza from Nasopharyngeal swab specimens and should not be used as a sole basis for treatment. Nasal washings and aspirates are unacceptable for Xpert Xpress SARS-CoV-2/FLU/RSV testing.  Fact Sheet for Patients: BloggerCourse.comhttps://www.fda.gov/media/152166/download  Fact Sheet for Healthcare Providers: SeriousBroker.ithttps://www.fda.gov/media/152162/download  This test is not yet approved or cleared by the [place] FDA and has been authorized for detection and/or diagnosis of SARS-CoV-2 by FDA under an Emergency Use Authorization (EUA). This EUA will remain in effect (meaning this test can be used) for the duration of the COVID-19 declaration under Section 564(b)(1) of the Act, 21  U.S.C. section 360bbb-3(b)(1), unless the authorization is terminated or revoked.  Performed at [facility], [address] [place], [place] [address]      Discharge Instructions:   Discharge Instructions     Diet Carb Modified   Complete by: As directed    Increase activity slowly   Complete by: As directed    No wound care   Complete by: As directed       Allergies as of 12/18/2020   No Known Allergies      Medication List     TAKE these medications    acetaminophen 500 MG tablet Commonly known as: TYLENOL Take 1 tablet (500 mg total) by mouth every 8 (eight) hours for 7 days.   amLODipine 5 MG tablet Commonly known as: NORVASC Take 1 tablet (5 mg total) by mouth daily.   ascorbic acid 1000 MG tablet Commonly known as: VITAMIN C Take 1 tablet (1,000 mg total) by mouth daily. Start taking on: December 19, 2020   aspirin EC 81 MG tablet Take 81 mg by mouth at bedtime.   docusate sodium 100 MG capsule Commonly known as: COLACE Take 1 capsule (100 mg total) by mouth 2 (two) times daily.   Insulin Infusion Pump Devi 50 Units by Does not apply route continuous. Novolog Insulin   levothyroxine 125 MCG tablet Commonly known as: SYNTHROID Take 125 mcg by mouth daily.   losartan 100 MG tablet Commonly known as: COZAAR Take 100 mg by mouth daily.   methocarbamol 750 MG tablet Commonly known as: ROBAXIN Take 1 tablet (750 mg total) by mouth every 8 (eight) hours as needed for muscle spasms.   MIRALAX PO Take 1 tablet by mouth at bedtime. 1 capful   onetouch ultrasoft lancets 1 each by Other route 6 (six) times daily.   OneTouch Verio test strip Generic drug: glucose blood 1 each by Other route 6 (six) times daily.   rosuvastatin 5 MG tablet Commonly known as: CRESTOR Take 1 tablet (5 mg total) by mouth daily. What changed: See the new instructions.   tadalafil 5 MG tablet Commonly known as: CIALIS Take 5 mg by mouth daily.    tetrahydrozoline-zinc 0.05-0.25 % ophthalmic solution Commonly known as: VISINE-AC Place 2 drops into both eyes 3 (three) times daily as needed.   timolol 0.5 % ophthalmic solution Commonly known as: BETIMOL Place 1 drop into the  left eye 2 (two) times daily.   Vitamin D3 25 MCG tablet Commonly known as: Vitamin D Take 2 tablets (2,000 Units total) by mouth 2 (two) times daily.               Durable Medical Equipment  (From admission, onward)           Start     Ordered   12/17/20 1419  For home use only DME Walker rolling  Once       Question Answer Comment  Walker: With 5 Inch Wheels   Patient needs a walker to treat with the following condition Weakness      12/17/20 1419   12/17/20 1419  For home use only DME 3 n 1  Once        12/17/20 1419            Follow-up Information     Care, [facility] Follow up.   Specialty: Home Health Services Why: home health PT services will be provided by [facility], start of care within 48 hours Contact information: [address] [place] [address] [phone]         Irena Reichmann, DO Follow up in 1 week(s).   Specialty: Family Medicine Contact information: [address] [place] [address] [place] [address] [phone]         Chilton Si, MD .   Specialty: Cardiology Contact information: [address] [place] [address] [place] [address] [phone]                  Time coordinating discharge: 35 min  Signed:  Joseph Art DO  [facility] 12/18/2020, 11:35 AM

## 2020-12-18 NOTE — Progress Notes (Signed)
Patient ID: Tom Baker, male   DOB: 02/02/1956, 65 y.o.   MRN: [id number] Visited with patient today to discuss findings on CT scan.  Patient has some significant calcifications at C4-5 and C5-6.  This would require a C5 corpectomy with significant undercutting of C4 and C6.  The other 2 levels C3-4 and C6-7 could be handled with discectomy and anterior plating.  Surgery is elective and patient is to go home today.  I will arrange for my office to follow-up with him in about 3 weeks time.  Patient notes that he would like to postpone surgery towards the end of the year.

## 2020-12-18 NOTE — Progress Notes (Signed)
Spoke with patient on the phone about his insulin pump. Patient states that he has started the insulin pump this am. Has been covering his food and blood sugars with boluses. Patient is ready to go home. Will try to see patient before discharge.   Spoke with Dr. Benjamine Mola about discontinuing Novolog insulin and starting the insulin pump order set until patient is discharged. Staff RN will be following the insulin pump order set and having patient sign patient contract and to answer questions about pump on flowsheets.   Smith Mince RN BSN CDE Diabetes Coordinator Pager: [phone]  8am-5pm

## 2020-12-18 NOTE — Progress Notes (Signed)
Pt was given AVS discharge summary and went over with him. IV was removed with catheter intact. DME in room to go home with him. Pt had no further questions.

## 2020-12-18 NOTE — TOC Transition Note (Signed)
Transition of Care [facility]) - CM/SW Discharge Note   Patient Details  Name: Tom Baker MRN: [id number] Date of Birth: [birth year]  Transition of [facility]) CM/SW Contact:  Epifanio Lesches, RN Phone Number: 12/18/2020, 1:08 PM   Clinical Narrative:    Patient will DC AG:TXMI  Anticipated DC date: 12/18/2020 Family notified: wife Transport by: car          -s/p IMN r HIP, 6/11 Pt from home with wife. NCM shared PT's recommendations for HHPT. Pt agree[facility] . Choice provided. Pt without preference. Referral made with [facility] and accepted.  Per MD patient ready for DC today.RN, patient, patient's wife, and [facility]nes notified of DC. Pt with [id number] walker and 3in1/bsc @ bedside to take home. TOC pharmacy will deliver Rx meds to bedside prior to d/c. Pt with noted post hospital follow-ups on AVS. Wife to provide transportation to home    RNCM will sign off for now as intervention is no longer needed. Please consult [place] again if new needs arise.    Final next level of care: Home w Home Health Services Barriers to Discharge: No Barriers Identified   Patient Goals and CMS Choice     Choice offered to / list presented to : Patient  Discharge Placement                       Discharge Plan and Services   Discharge Planning Services: CM Consult            DME Arranged: 3-N-1, Walker rolling DME Agency: [employer] Date DME Agency Contacted: 12/17/20 Time DME Agency Contacted: [phone] Representative spoke with at DME Agency: Velna Hatchet HH Arranged: PT HH Agency: [facility]   Time [facility] Agency Contacted: 1425    Social Determinants of Health (SDOH) Interventions     Readmission Risk Interventions No flowsheet data found.

## 2020-12-18 NOTE — Plan of Care (Signed)
  Problem: Education: Goal: Knowledge of General Education information will improve Description: Including pain rating scale, medication(s)/side effects and non-pharmacologic comfort measures Outcome: Adequate for Discharge   Problem: Health Behavior/Discharge Planning: Goal: Ability to manage health-related needs will improve Outcome: Adequate for Discharge   Problem: Clinical Measurements: Goal: Ability to maintain clinical measurements within normal limits will improve Outcome: Adequate for Discharge Goal: Will remain free from infection Outcome: Adequate for Discharge Goal: Diagnostic test results will improve Outcome: Adequate for Discharge Goal: Respiratory complications will improve Outcome: Adequate for Discharge Goal: Cardiovascular complication will be avoided Outcome: Adequate for Discharge   Problem: Activity: Goal: Risk for activity intolerance will decrease Outcome: Adequate for Discharge   Problem: Nutrition: Goal: Adequate nutrition will be maintained Outcome: Adequate for Discharge   Problem: Coping: Goal: Level of anxiety will decrease Outcome: Adequate for Discharge   Problem: Elimination: Goal: Will not experience complications related to bowel motility Outcome: Adequate for Discharge Goal: Will not experience complications related to urinary retention Outcome: Adequate for Discharge   Problem: Pain Managment: Goal: General experience of comfort will improve Outcome: Adequate for Discharge   Problem: Safety: Goal: Ability to remain free from injury will improve Outcome: Adequate for Discharge   Problem: Skin Integrity: Goal: Risk for impaired skin integrity will decrease Outcome: Adequate for Discharge   Problem: Acute Rehab OT Goals (only OT should resolve) Goal: Pt. Will Perform Lower Body Bathing Outcome: Adequate for Discharge Goal: Pt. Will Perform Lower Body Dressing Outcome: Adequate for Discharge Goal: Pt. Will Transfer To  Toilet Outcome: Adequate for Discharge Goal: Pt. Will Perform Toileting-Clothing Manipulation Outcome: Adequate for Discharge Goal: OT Additional ADL Goal #1 Outcome: Adequate for Discharge   Problem: Acute Rehab PT Goals(only PT should resolve) Goal: Pt Will Go Supine/Side To Sit Outcome: Adequate for Discharge Goal: Patient Will Transfer Sit To/From Stand Outcome: Adequate for Discharge Goal: Pt Will Transfer Bed To Chair/Chair To Bed Outcome: Adequate for Discharge Goal: Pt Will Ambulate Outcome: Adequate for Discharge Goal: Pt Will Go Up/Down Stairs Outcome: Adequate for Discharge Goal: Pt Will Verbalize and Adhere to Precautions While Description: PT Will Verbalize and Adhere to Precautions While Performing Mobility Outcome: Adequate for Discharge Goal: Pt/caregiver will Perform Home Exercise Program Outcome: Adequate for Discharge

## 2020-12-18 NOTE — Progress Notes (Signed)
Physical Therapy Treatment Patient Details Name: Tom Baker MRN: [id number] DOB: 1956/04/20 Today's Date: 12/18/2020    History of Present Illness Tom Baker is a 65 y.o. male who presented to the ER 6/11 status post fall onto right hip.  Now s/p INTRAMEDULLARY (IM) NAIL INTERTROCHANTRIC (Right: Hip) 6/11. He had taken his dog out to go to the bathroom, was standing on the porch talking to his neighbor when he fell onto his right hip. 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 Pt with medical history significant of type 1 diabetes on insulin pump, hypertension, hypothyroidism, hyperlipidemia and BPH.    PT Comments    Pt eager to d/c home, but is nervous about stair practice today. Pt required max cuing for sequencing, RW placement, and caregiver guarding/RW support during stair traininig, pt practicing steps x2 but very anxious. PT explained possibility of ambulance transfer home, pt and wife decline this idea due to expense. Per pt and wife, they will have pt's uncle (who is physically fit to assist) assist pt on entry into home via steps as PT feels pt needs +2 for steps at this time. Handouts for stair navigation with RW given to pt and wife. PT reviewed gait sequencing, HEP with pt and wife, both pt and wife report no further questions. Will d/c today.    Follow Up Recommendations  Home health PT;Supervision for mobility/OOB     Equipment Recommendations  Rolling walker with 5" wheels;3in1 (PT)    Recommendations for Other Services       Precautions / Restrictions Precautions Precautions: Fall Restrictions Weight Bearing Restrictions: Yes RLE Weight Bearing: Partial weight bearing RLE Partial Weight Bearing Percentage or Pounds: 25    Mobility  Bed Mobility               General bed mobility comments: pt up in recliner upon PT arrival to room.    Transfers Overall transfer  level: Needs assistance Equipment used: Rolling walker (2 wheeled) (tub bench transfer) Transfers: Sit to/from Stand Sit to Stand: Min guard         General transfer comment: for safety, slow to rise and transition hands from recliner<>RW. STS x2, from recliner.  Ambulation/Gait Ambulation/Gait assistance: Min guard Gait Distance (Feet): 10 Feet Assistive device: Rolling walker (2 wheeled) Gait Pattern/deviations: Step-to pattern;Decreased step length - right;Trunk flexed;Antalgic;Decreased weight shift to right Gait velocity: decr   General Gait Details: close guard for safety, PT cued pt's wife in safe guarding (gait belt, hand placement on gait belt).   Stairs   Stairs assistance: Min assist;+2 safety/equipment Stair Management: No rails;Step to pattern;Backwards;With walker Number of Stairs: 3 (then x1 step) General stair comments: min assist +2 for truncal steadying, step-by-step sequencing cuing, and blocking RW to prevent RW movement. Max cues for placement of RW on steps, sequencing (placing R foot up and hopping to R foot with L to prevent excessive WB). Pt very limited by anxiety, needed rest break in between x2 stair attempts.   Wheelchair Mobility    Modified Rankin (Stroke Patients Only)       Balance Overall balance assessment: Needs assistance Sitting-balance support: No upper extremity supported Sitting balance-Leahy Scale: Good     Standing balance support: During functional activity Standing balance-Leahy Scale: Poor Standing balance comment: reliant on external support  Cognition Arousal/Alertness: Awake/alert Behavior During Therapy: Anxious Overall Cognitive Status: Within Functional Limits for tasks assessed                                 General Comments: anxiety about step navigation      Exercises      General Comments General comments (skin integrity, edema, etc.): Reviewed and  practiced HEP: heel slides, hip abduction, LAQs, quad sets. HEP administered to pt      Pertinent Vitals/Pain Pain Assessment: Faces Faces Pain Scale: Hurts little more Pain Location: R hip Pain Descriptors / Indicators: Discomfort;Operative site guarding;Guarding Pain Intervention(s): Monitored during session;Limited activity within patient's tolerance;Repositioned    Home Living                      Prior Function            PT Goals (current goals can now be found in the care plan section) Acute Rehab PT Goals PT Goal Formulation: With patient Time For Goal Achievement: 12/29/20 Potential to Achieve Goals: Good Progress towards PT goals: Progressing toward goals    Frequency    Min 5X/week      PT Plan Current plan remains appropriate    Co-evaluation              AM-PAC PT "6 Clicks" Mobility   Outcome Measure  Help needed turning from your back to your side while in a flat bed without using bedrails?: A Little Help needed moving from lying on your back to sitting on the side of a flat bed without using bedrails?: A Little Help needed moving to and from a bed to a chair (including a wheelchair)?: A Little Help needed standing up from a chair using your arms (e.g., wheelchair or bedside chair)?: A Little Help needed to walk in hospital room?: A Little Help needed climbing 3-5 steps with a railing? : A Lot 6 Click Score: 17    End of Session Equipment Utilized During Treatment: Gait belt Activity Tolerance: Patient limited by pain;Patient limited by fatigue Patient left: in bed;with call bell/phone within reach;with nursing/sitter in room;with family/visitor present Nurse Communication: Mobility status PT Visit Diagnosis: Unsteadiness on feet (R26.81);Difficulty in walking, not elsewhere classified (R26.2);Pain Pain - Right/Left: Right Pain - part of body: Leg     Time: 4403-4742 PT Time Calculation (min) (ACUTE ONLY): 50 min  Charges:   $Gait Training: 23-37 mins $Therapeutic Exercise: 8-22 mins                     Marye Round, PT DPT Acute Rehabilitation Services Pager [phone]  Office [phone]    Truddie Coco 12/18/2020, 6:36 PM

## 2021-01-09 ENCOUNTER — Ambulatory Visit
Admission: RE | Admit: 2021-01-09 | Discharge: 2021-01-09 | Disposition: A | Discharge status: Home / Self Care | Payer: BC Managed Care – PPO | Source: Ambulatory Visit | Attending: Orthopedic Surgery | Admitting: Orthopedic Surgery

## 2021-01-09 ENCOUNTER — Other Ambulatory Visit: Payer: Self-pay | Admitting: Orthopedic Surgery

## 2021-01-09 ENCOUNTER — Other Ambulatory Visit: Payer: Self-pay

## 2021-01-09 DIAGNOSIS — S7291XA Unspecified fracture of right femur, initial encounter for closed fracture: Secondary | ICD-10-CM

## 2021-01-14 ENCOUNTER — Ambulatory Visit (INDEPENDENT_AMBULATORY_CARE_PROVIDER_SITE_OTHER): Payer: BC Managed Care – PPO | Admitting: Cardiovascular Disease

## 2021-01-14 ENCOUNTER — Encounter (HOSPITAL_BASED_OUTPATIENT_CLINIC_OR_DEPARTMENT_OTHER): Payer: Self-pay | Admitting: Cardiovascular Disease

## 2021-01-14 ENCOUNTER — Other Ambulatory Visit: Payer: Self-pay

## 2021-01-14 DIAGNOSIS — I10 Essential (primary) hypertension: Secondary | ICD-10-CM | POA: Diagnosis not present

## 2021-01-14 DIAGNOSIS — I483 Typical atrial flutter: Secondary | ICD-10-CM | POA: Diagnosis not present

## 2021-01-14 DIAGNOSIS — E785 Hyperlipidemia, unspecified: Secondary | ICD-10-CM | POA: Diagnosis not present

## 2021-01-14 NOTE — Assessment & Plan Note (Signed)
He remains in sinus rhythm.  He has not had any recurrent atrial fibrillation.  Even when he was in the hospital with femur fracture and hip fracture he did have a A. fib.  He is currently on Xarelto for prophylaxis after his surgery.  When this is completed he will go back to taking just aspirin.

## 2021-01-14 NOTE — Patient Instructions (Signed)
Medication Instructions:  Your physician recommends that you continue on your current medications as directed. Please refer to the Current Medication list given to you today.   *If you need a refill on your cardiac medications before your next appointment, please call your pharmacy*  Lab Work: NONE  Testing/Procedures: NONE  Follow-Up: At [facility], you and your health needs are our priority.  As part of our continuing mission to provide you with exceptional heart care, we have created designated Provider Care Teams.  These Care Teams include your primary Cardiologist (physician) and Advanced Practice Providers (APPs -  Physician Assistants and Nurse Practitioners) who all work together to provide you with the care you need, when you need it.  We recommend signing up for the patient portal called "MyChart".  Sign up information is provided on this After Visit Summary.  MyChart is used to connect with patients for Virtual Visits (Telemedicine).  Patients are able to view lab/test results, encounter notes, upcoming appointments, etc.  Non-urgent messages can be sent to your provider as well.   To learn more about what you can do with MyChart, go to [email or web address].    Your next appointment:   12 month(s)  The format for your next appointment:   In Person  Provider:   DR [facility] OR Ronn Melena NP

## 2021-01-14 NOTE — Progress Notes (Signed)
Cardiology Office Note   Date:  01/18/2021   ID:  Tom Baker, DOB 05-19-1956, MRN [id number]  PCP:  Irena Reichmann, DO  Cardiologist:  Chilton Si, MD  Electrophysiologist:  None   Evaluation Performed:  Follow-Up Visit  Chief Complaint:  Annual follow up  History of Present Illness:    Tom Baker is a 65 y.o. male with diabetes mellitus type 1, hypertension, and hyperlipidemia who presents for follow up.  Tom Baker was initially seen 11/2015 with lower extremity edema. He was referred for [facility] on 12/2015 that was negative for ischemia.  He was admitted to the hospital 6/9-6/13 with cough-variant syncope in the setting of pneumonia.  He was noted to have a moderate pericardial effusion and was started on colchicine and aspirin.  He denied chest pain at that time.  He was also noted to have atrial flutter and was treated with aspirin only due to concern of hemorrhagic conversion of Tom pericardial effusion.  Tom Baker followed up in clinic and Tom repeat echo showed resolution of the effusion. He was started on Xarelto at that time.  Tom Baker denies chest pain, shortness of breath, lower extremity edema, orthopnea or PND.  He also denies any recurrent palpitations since discharge.  He was referred for [facility] 12/05/15 that was negative for ischemia.     He was admitted 12/2020 with a hip fracture that occurred after a mechanical fall.  Today, he is accompanied by Tom Baker. He reports Tom fall occurred as he was chasing their dog. During the surgery he was not in Afib. At this time he is feeling better, and notes having some increased strength and mobility. Tom physical therapy is going well. For Tom regimen, he continues to take aspirin. He is now taking Xarelto since Tom fall to prevent a thrombus. He is vaccinated and has some of the COVID booster vaccinations. Tom first booster was in 04/2020. Last week he saw Dr. Thomasena Edis, but he had lab work up performed one month prior. He  denies any chest pain, shortness of breath, palpitations, or exertional symptoms. No headaches, lightheadedness, or syncope to report. Also has no lower extremity edema, orthopnea or PND.    Past Medical History:  Diagnosis Date   BPH (benign prostatic hyperplasia)    Complication of anesthesia    hard to urinate after surgery ankle surgery 2005 [facility]   Diabetes mellitus type 1 (HCC)    GERD (gastroesophageal reflux disease)    Glaucoma    left eye   Hyperlipidemia    Hypertension    Hypothyroid    Pericardial effusion 2017   Pneumonia 2017   Post-nasal drip 08/02/2016   Runny nose    occ at times pe pt   Wears glasses    Past Surgical History:  Procedure Laterality Date   ANKLE SURGERY Right 2010   [facility] plate and 6 screws still in   CHOLECYSTECTOMY  1995   laparoscopic   CYSTOSCOPY WITH INSERTION OF UROLIFT N/A 09/16/2020   Procedure: CYSTOSCOPY WITH INSERTION OF UROLIFT; URETHRAL DILATION;  Surgeon: Jerilee Field, MD;  Location: [facility];  Service: Urology;  Laterality: N/A;   EYE SURGERY Left 2003   lens replacement for cataract   INTRAMEDULLARY (IM) NAIL INTERTROCHANTERIC Right 12/13/2020   Procedure: INTRAMEDULLARY (IM) NAIL INTERTROCHANTRIC;  Surgeon: Myrene Galas, MD;  Location: [facility] OR;  Service: Orthopedics;  Laterality: Right;   KNEE SURGERY Right 1988   arthroscopic   radioactive iodine to thryoid  2000   TOE SURGERY Left 1972   great toe   VITRECTOMY  1999     Current Meds  Medication Sig   amLODipine (NORVASC) 5 MG tablet Take 1 tablet (5 mg total) by mouth daily.   aspirin EC 81 MG tablet Take 81 mg by mouth at bedtime.   Cholecalciferol 125 MCG (5000 UT) TABS Take 1 tablet (5,000 Units total) by mouth daily.   Insulin Infusion Pump DEVI 50 Units by Does not apply route continuous. Novolog Insulin   Lancets (ONETOUCH ULTRASOFT) lancets 1 each by Other route 6 (six) times daily.   levothyroxine (SYNTHROID, LEVOTHROID) 125 MCG  tablet Take 125 mcg by mouth daily.   losartan (COZAAR) 100 MG tablet Take 100 mg by mouth daily.   methocarbamol (ROBAXIN) 750 MG tablet Take 1 tablet (750 mg total) by mouth every 8 (eight) hours as needed for muscle spasms.   ONETOUCH VERIO test strip 1 each by Other route 6 (six) times daily.   Polyethylene Glycol 3350 (MIRALAX PO) Take 1 tablet by mouth at bedtime. 1 capful   rivaroxaban (XARELTO) 10 MG TABS tablet Take 1 tablet (10 mg total) by mouth daily.   rosuvastatin (CRESTOR) 5 MG tablet Take 1 tablet (5 mg total) by mouth daily.   tadalafil (CIALIS) 5 MG tablet Take 5 mg by mouth daily.   tetrahydrozoline-zinc (VISINE-AC) 0.05-0.25 % ophthalmic solution Place 2 drops into both eyes 3 (three) times daily as needed.   timolol (BETIMOL) 0.5 % ophthalmic solution Place 1 drop into the left eye 2 (two) times daily.   [DISCONTINUED] ascorbic acid (VITAMIN C) 1000 MG tablet Take 1 tablet (1,000 mg total) by mouth daily.   [DISCONTINUED] docusate sodium (COLACE) 100 MG capsule Take 1 capsule (100 mg total) by mouth 2 (two) times daily.     Allergies:   Patient has no known allergies.   Social History   Tobacco Use   Smoking status: Never   Smokeless tobacco: Never  Vaping Use   Vaping Use: Never used  Substance Use Topics   Alcohol use: No    Alcohol/week: 0.0 standard drinks    Comment: last used 1992   Drug use: No     Family Hx: The patient's family history includes Bone cancer in Tom Baker.  ROS:   Please see the history of present illness.    (+) Fall All other systems reviewed and are negative.   Prior CV studies:   The following studies were reviewed today:  ABI 12/16/2020: Right: Resting right ankle-brachial index indicates noncompressible right  lower extremity arteries. The right toe-brachial index is abnormal.   Left: Resting left ankle-brachial index is within normal range. No  evidence of significant left lower extremity arterial disease. The left   toe-brachial index is abnormal.  Echo 12/13/15: Study Conclusions   - Left ventricle: The cavity size was normal. Systolic function was   normal. The estimated ejection fraction was in the range of 60%   to 65%. Wall motion was normal; there were no regional wall   motion abnormalities. - Atrial septum: No defect or patent foramen ovale was identified. - Pericardium, extracardiac: Moderate pericardial effusion mostly   posteriro and would be hard to reach percutaneously   IVC small with collapse on sniff. No evidence of tamponade.   Lexiscan Myoview 12/13/15: The left ventricular ejection fraction is hyperdynamic (>65%). Nuclear stress EF: 69%. Persistent diffuse ST elevation consistent with early repolarization.Marland Kitchen No change from baseline EKG The study  is normal. This is a low risk study.  Labs/Other Tests and Data Reviewed:    EKG:   01/14/2021: EKG is not ordered today. 11/02/2019: Sinus rhythm. Rate 82 bpm. PVCs.   Recent Labs: 12/13/2020: ALT 16 12/14/2020: TSH 0.513 12/18/2020: BUN 15; Creatinine, Ser 1.33; Hemoglobin 8.4; Platelets 204; Potassium 3.7; Sodium 137   01/06/2018: Potassium 4.6, BUN 17, creatinine 1.16 total cholesterol 139, triglycerides 97, HDL 50, LDL 70 TSH 0.656  Recent Lipid Panel Lab Results  Component Value Date/Time   CHOL 145 07/04/2017 09:07 AM   TRIG 51 07/04/2017 09:07 AM   HDL 53 07/04/2017 09:07 AM   CHOLHDL 2.7 07/04/2017 09:07 AM   LDLCALC 82 07/04/2017 09:07 AM    Wt Readings from Last 3 Encounters:  01/14/21 188 lb (85.3 kg)  12/13/20 188 lb (85.3 kg)  09/16/20 187 lb 3.2 oz (84.9 kg)     Objective:    VS:  BP 102/78 (BP Location: Left Arm, Patient Position: Sitting, Cuff Size: Normal)   Pulse 82   Ht 6\' 3"  (1.905 m)   Wt 188 lb (85.3 kg)   BMI 23.50 kg/m  , BMI Body mass index is 23.5 kg/m. GENERAL:  Well appearing.  Frail HEENT: Pupils equal round and reactive, fundi not visualized, oral mucosa unremarkable NECK:  No  jugular venous distention, waveform within normal limits, carotid upstroke brisk and symmetric, no bruits LUNGS:  Clear to auscultation bilaterally HEART:  RRR.  PMI not displaced or sustained,S1 and S2 within normal limits, no S3, no S4, no clicks, no rubs, no murmurs ABD:  Flat, positive bowel sounds normal in frequency in pitch, no bruits, no rebound, no guarding, no midline pulsatile mass, no hepatomegaly, no splenomegaly EXT:  2 plus pulses throughout, trace edema, no cyanosis no clubbing SKIN:  No rashes no nodules NEURO:  Cranial nerves II through XII grossly intact, motor grossly intact throughout PSYCH:  Cognitively intact, oriented to person place and time    ASSESSMENT & PLAN:   Atrial flutter (HCC) He remains in sinus rhythm.  He has not had any recurrent atrial fibrillation.  Even when he was in the hospital with femur fracture and hip fracture he did have a A. fib.  He is currently on Xarelto for prophylaxis after Tom surgery.  When this is completed he will go back to taking just aspirin.  Hyperlipidemia Lipids have been well-controlled.  Checked with Tom endocrinologist recently and reportedly was at goal.  Continue rosuvastatin.  Hypertension Blood pressure is well-controlled on amlodipine and losartan.  It was a little low today but he denies any lightheadedness or dizziness.  Continue current regimen.     Medication Adjustments/Labs and Tests Ordered: Current medicines are reviewed at length with the patient today.  Concerns regarding medicines are outlined above.   Tests Ordered: No orders of the defined types were placed in this encounter.   Medication Changes: No orders of the defined types were placed in this encounter.   Disposition:  Follow up with Ladaysha Soutar C. , MD, [facility] in 1 year.  I,Mathew Stumpf,acting as a [facility] for Neurosurgeon, MD.,have documented all relevant documentation on the behalf of Chilton Si, MD,as directed by  Chilton Si, MD while in the presence of Chilton Si, MD.  I, Chrisie Jankovich C. Chilton Si, MD have reviewed all documentation for this visit.  The documentation of the exam, diagnosis, procedures, and orders on 01/18/2021 are all accurate and complete.   Signed, 01/20/2021, MD  01/18/2021 1:29  PM    [facility]

## 2021-01-14 NOTE — Assessment & Plan Note (Signed)
Blood pressure is well-controlled on amlodipine and losartan.  It was a little low today but he denies any lightheadedness or dizziness.  Continue current regimen.

## 2021-01-14 NOTE — Assessment & Plan Note (Signed)
Lipids have been well-controlled.  Checked with his endocrinologist recently and reportedly was at goal.  Continue rosuvastatin.

## 2021-01-18 ENCOUNTER — Encounter (HOSPITAL_BASED_OUTPATIENT_CLINIC_OR_DEPARTMENT_OTHER): Payer: Self-pay | Admitting: Cardiovascular Disease

## 2021-01-30 ENCOUNTER — Other Ambulatory Visit: Payer: Self-pay

## 2021-01-30 ENCOUNTER — Ambulatory Visit
Admission: RE | Admit: 2021-01-30 | Discharge: 2021-01-30 | Disposition: A | Discharge status: Home / Self Care | Payer: BC Managed Care – PPO | Source: Ambulatory Visit | Attending: Orthopedic Surgery | Admitting: Orthopedic Surgery

## 2021-01-30 ENCOUNTER — Other Ambulatory Visit: Payer: Self-pay | Admitting: Orthopedic Surgery

## 2021-01-30 DIAGNOSIS — T1490XA Injury, unspecified, initial encounter: Secondary | ICD-10-CM

## 2021-03-06 ENCOUNTER — Other Ambulatory Visit: Payer: Self-pay | Admitting: Orthopedic Surgery

## 2021-03-06 ENCOUNTER — Ambulatory Visit
Admission: RE | Admit: 2021-03-06 | Discharge: 2021-03-06 | Disposition: A | Discharge status: Home / Self Care | Payer: BC Managed Care – PPO | Source: Ambulatory Visit | Attending: Orthopedic Surgery | Admitting: Orthopedic Surgery

## 2021-03-06 ENCOUNTER — Other Ambulatory Visit: Payer: Self-pay

## 2021-03-06 DIAGNOSIS — Z09 Encounter for follow-up examination after completed treatment for conditions other than malignant neoplasm: Secondary | ICD-10-CM

## 2021-05-14 ENCOUNTER — Other Ambulatory Visit: Payer: Self-pay | Admitting: Cardiovascular Disease

## 2021-05-14 NOTE — Telephone Encounter (Signed)
Rx request sent to pharmacy.  

## 2021-06-18 ENCOUNTER — Telehealth: Payer: Self-pay | Admitting: *Deleted

## 2021-06-18 NOTE — Telephone Encounter (Signed)
° °  Name: Nicholaus Steinke  DOB: Sep 02, 1955  MRN: [id number]   Primary Cardiologist: Chilton Si, MD  Chart reviewed as part of pre-operative protocol coverage. Patient was contacted 06/18/2021 in reference to pre-operative risk assessment for pending surgery as outlined below.  Isaack Preble was last seen 01/2021 by Dr. Duke Salvia. History reviewed. Primary prior issues were atrial flutter in the setting of pericardial effusion and prior syncope. Prior stress testing 2017 was negative for ischemia. He was briefly on short term Xarelto following a hip fx per Dr. Leonides Sake last notes but then advised to go back to baby ASA after he stopped Xarelto which he confirms he did. RCRI 0.9% indicating low CV risk. I reached out to patient for update on how he is doing. The patient affirms he has been doing well without any new cardiac symptoms. Able to achieve 6.61 METS without angina or dyspnea. Higher level of activity is limited by orthopedic issues rather than any new cardiac symptoms. Therefore, based on ACC/AHA guidelines, the patient would be at acceptable risk for the planned procedure without further cardiovascular testing. The patient was advised that if he develops new symptoms prior to surgery to contact our office to arrange for a follow-up visit, and he verbalized understanding.  He has no history of MI, PCI, CABG, TIA or stroke therefore OK to hold ASA 5-7 days prior to surgery if needed.   Will route this bundled recommendation to requesting provider via Epic fax function. Please call with questions.  Laurann Montana, PA-C 06/18/2021, 1:29 PM

## 2021-06-18 NOTE — Telephone Encounter (Signed)
° °  Pre-operative Risk Assessment    Patient Name: Tom Baker  DOB: 1956/02/03 MRN: [id number]      Request for Surgical Clearance    Procedure:   C3-4, C4-5, C5-6, C6-7 ANTERIOR CERVICAL DECOMPRESSION/DISCECTOMY/FUSION, POSSIBLE CORPECTOMY  Date of Surgery:  Clearance TBD                                 Surgeon:  DR. Barnett Abu Surgeon's Group or Practice Name:  [facility] Phone number:  [phone] Fax number:  [phone] ATTN: JESSICA   Type of Clearance Requested:   - Medical  - Pharmacy:  Hold Aspirin     Type of Anesthesia:  General    Additional requests/questions:    Elpidio Anis   06/18/2021, 11:21 AM

## 2021-06-19 ENCOUNTER — Other Ambulatory Visit: Payer: Self-pay

## 2021-06-19 ENCOUNTER — Other Ambulatory Visit: Payer: Self-pay | Admitting: Orthopedic Surgery

## 2021-06-19 ENCOUNTER — Ambulatory Visit
Admission: RE | Admit: 2021-06-19 | Discharge: 2021-06-19 | Disposition: A | Discharge status: Home / Self Care | Payer: BC Managed Care – PPO | Source: Ambulatory Visit | Attending: Orthopedic Surgery | Admitting: Orthopedic Surgery

## 2021-06-19 DIAGNOSIS — T148XXA Other injury of unspecified body region, initial encounter: Secondary | ICD-10-CM

## 2021-06-22 ENCOUNTER — Other Ambulatory Visit: Payer: Self-pay | Admitting: Neurological Surgery

## 2021-09-17 ENCOUNTER — Encounter (HOSPITAL_COMMUNITY): Payer: Self-pay

## 2021-09-17 ENCOUNTER — Other Ambulatory Visit: Payer: Self-pay

## 2021-09-17 ENCOUNTER — Encounter (HOSPITAL_COMMUNITY)
Admission: RE | Admit: 2021-09-17 | Discharge: 2021-09-17 | Disposition: A | Discharge status: Home / Self Care | Payer: BC Managed Care – PPO | Source: Ambulatory Visit | Attending: Neurological Surgery | Admitting: Neurological Surgery

## 2021-09-17 DIAGNOSIS — E109 Type 1 diabetes mellitus without complications: Secondary | ICD-10-CM | POA: Insufficient documentation

## 2021-09-17 DIAGNOSIS — Z01812 Encounter for preprocedural laboratory examination: Secondary | ICD-10-CM | POA: Insufficient documentation

## 2021-09-17 LAB — HEMOGLOBIN A1C
Hgb A1c MFr Bld: 7.7 % — ABNORMAL HIGH (ref 4.8–5.6)
Mean Plasma Glucose: 174.29 mg/dL

## 2021-09-17 LAB — GLUCOSE, CAPILLARY: Glucose-Capillary: 115 mg/dL — ABNORMAL HIGH (ref 70–99)

## 2021-09-17 LAB — SURGICAL PCR SCREEN
MRSA, PCR: NEGATIVE
Staphylococcus aureus: NEGATIVE

## 2021-09-17 LAB — CBC
HCT: 41.5 % (ref 39.0–52.0)
Hemoglobin: 14.2 g/dL (ref 13.0–17.0)
MCH: 29 pg (ref 26.0–34.0)
MCHC: 34.2 g/dL (ref 30.0–36.0)
MCV: 84.7 fL (ref 80.0–100.0)
Platelets: 241 10*3/uL (ref 150–400)
RBC: 4.9 MIL/uL (ref 4.22–5.81)
RDW: 13.7 % (ref 11.5–15.5)
WBC: 4.7 10*3/uL (ref 4.0–10.5)
nRBC: 0 % (ref 0.0–0.2)

## 2021-09-17 LAB — TYPE AND SCREEN
ABO/RH(D): A POS
Antibody Screen: NEGATIVE

## 2021-09-17 LAB — BASIC METABOLIC PANEL
Anion gap: 10 (ref 5–15)
BUN: 33 mg/dL — ABNORMAL HIGH (ref 8–23)
CO2: 25 mmol/L (ref 22–32)
Calcium: 9.3 mg/dL (ref 8.9–10.3)
Chloride: 104 mmol/L (ref 98–111)
Creatinine, Ser: 1.31 mg/dL — ABNORMAL HIGH (ref 0.61–1.24)
GFR, Estimated: >60 mL/min (ref >60)
Glucose, Bld: 107 mg/dL — ABNORMAL HIGH (ref 70–99)
Potassium: 4.3 mmol/L (ref 3.5–5.1)
Sodium: 139 mmol/L (ref 135–145)

## 2021-09-17 NOTE — Progress Notes (Signed)
cone

## 2021-09-17 NOTE — Pre-Procedure Instructions (Addendum)
? ? Tom Baker ? 09/17/2021  ?  ?  ? ? Your procedure is scheduled on Tuesday, March 28. ? Report to [facility] Admitting at 0730 A.M. ? Call this number if you have problems the morning of surgery: ? [phone]  this is the pre op desk. ?For questions before the day of surgery; call [phone], this is the Pre Admission office- call between 8:00 AM and 4:00 PM ? ? Remember: ? Do not eat  after midnight. ? You may drink clear liquids until 4:30 AM .   ?Clear liquids allowed are:                    Water, Juice (non-citric and without pulp - diabetics please choose diet or no sugar options), Carbonated beverages - (diabetics please choose diet or no sugar options), Clear Tea, Black Coffee only (no creamer, milk or cream including half and half), Plain Jell-O only (diabetics please choose diet or no sugar options), Gatorade (diabetics please choose diet or no sugar options), and Plain Popsicles only ?  ? Take these medicines the morning of surgery with A SIP OF WATER : ?amLODipine (NORVASC) ?levothyroxine (SYNTHROID, LEVOTHROID) ?May use eye drops. ? ?Do Not take losartan (COZAAR) the morning of surgery.  This is per Anesthesia Department ? ?WHAT DO I DO ABOUT MY DIABETES MEDICATION? ? Ask your PCP or Endocrinologist  about what you should do regarding your Insulin Pump. ? Bring Insulin and extra supplies in case you need to  stop the pump during hospitalization. ? ?How to Manage Your Diabetes ?Before and After Surgery ? ?Why is it important to control my blood sugar before and after surgery? ?Improving blood sugar levels before and after surgery helps healing and can limit problems. ?A way of improving blood sugar control is eating a healthy diet by: ? Eating less sugar and carbohydrates ? Increasing activity/exercise ? Talking with your doctor about reaching your blood sugar goals ?High blood sugars (greater than 180 mg/dL) can raise your risk of infections and slow your recovery, so you will need  to focus on controlling your diabetes during the weeks before surgery. ?Make sure that the doctor who takes care of your diabetes knows about your planned surgery including the date and location. ? ?How do I manage my blood sugar before surgery? ?Check your blood sugar at least 4 times a day, starting 2 days before surgery, to make sure that the level is not too high or low. ?Check your blood sugar the morning of your surgery when you wake up and every 2 hours until you get to the Short Stay unit. ?If your blood sugar is less than 70 mg/dL, you will need to treat for low blood sugar: ?Do not take insulin. ?Treat a low blood sugar (less than 70 mg/dL) with ? cup of clear juice (cranberry or apple), 4 glucose tablets, OR glucose gel. ?Recheck blood sugar in 15 minutes after treatment (to make sure it is greater than 70 mg/dL). If your blood sugar is not greater than 70 mg/dL on recheck, call [phone] ? for further instructions. ?Report your blood sugar to the short stay nurse when you get to Short Stay. ? ?If you are admitted to the hospital after surgery: ?Your blood sugar will be checked by the staff and you will probably be given insulin after surgery (instead of oral diabetes medicines) to make sure you have good blood sugar levels. ?The goal for blood sugar control after surgery  is 80-180 mg/dL. ? ?[facility]- Preparing For Surgery ? ?Before surgery, you can play an important role. Because skin is not sterile, your skin needs to be as free of germs as possible. You can reduce the number of germs on your skin by washing with CHG (chlorahexidine gluconate) Soap before surgery.  CHG is an antiseptic cleaner which kills germs and bonds with the skin to continue killing germs even after washing.   ? ?Oral Hygiene is also important to reduce your risk of infection.  Remember - BRUSH YOUR TEETH THE MORNING OF SURGERY WITH YOUR REGULAR TOOTHPASTE ? ?Please do not use if you have an allergy to CHG or antibacterial  soaps. If your skin becomes reddened/irritated stop using the CHG.  ?Do not shave (including legs and underarms) for at least 48 hours prior to first CHG shower. It is OK to shave your face. ? ?Please follow these instructions carefully. ?  ?Shower the NIGHT BEFORE SURGERY and the MORNING OF SURGERY with CHG.  ? ?If you chose to wash your hair, wash your hair first as usual with your normal shampoo. ? ?After you shampoo, rinse your hair and body thoroughly to remove the shampoo. ? ?Use CHG as you would any other liquid soap. You can apply CHG directly to the skin and wash gently with a scrungie or a clean washcloth.  ? ?Apply the CHG Soap to your body ONLY FROM THE NECK DOWN.  Do not use on open wounds or open sores. Avoid contact with your eyes, ears, mouth and genitals (private parts).  ? ?Wash thoroughly, paying special attention to the area where your surgery will be performed. ? ?Thoroughly rinse your body with warm water from the neck down. ? ?DO NOT shower/wash with your normal soap after using and rinsing off the CHG Soap. ? ?Pat yourself dry with a CLEAN TOWEL. ? ?Wear CLEAN PAJAMAS to bed the night before surgery, wear comfortable clothes the morning of surgery ? ?Place CLEAN SHEETS on your bed the night of your first shower and DO NOT SLEEP WITH PETS. ? ?Day of Surgery: ?Shower as instructed above. ?Dry off with a clean towel. ?Do not wear lotions, powders, or perfumes, or deodorant. ?Please wear clean clothes to the hospital/surgery center.   ?Remember to brush your teeth WITH YOUR REGULAR TOOTHPASTE.  ?           Men may shave face and neck. ? Do not bring valuables to the hospital. ? [facility] is not responsible for any belongings or valuables. ? ?Contacts, dentures or bridgework may not be worn into surgery.  Leave your suitcase in the car.  After surgery it may be brought to your room. ?For patients admitted to the hospital, discharge time will be determined by your treatment team. ?Special  instructions:   ?Please read over the following fact sheets that you were given. ? ? ? ?  ? ? ? ?  ? ? ?  ?

## 2021-09-17 NOTE — Pre-Procedure Instructions (Signed)
? ? Tom Baker ? 09/17/2021  ?  ?  ? ? Your procedure is scheduled on Tuesday, March 28. ? Report to [place] at 0730 A.M. ? Call this number if you have problems the morning of surgery: ? [phone]  this is the pre op desk. ?For questions before the day of surgery; call [phone], this is the Pre Admission office- call between 8:00 AM and 4:00 PM ? ? Remember: ? Do not eat  after midnight. ? You may drink clear liquids until 4:30 AM .   ?Clear liquids allowed are:                    Water, Juice (non-citric and without pulp - diabetics please choose diet or no sugar options), Carbonated beverages - (diabetics please choose diet or no sugar options), Clear Tea, Black Coffee only (no creamer, milk or cream including half and half), Plain Jell-O only (diabetics please choose diet or no sugar options), Gatorade (diabetics please choose diet or no sugar options), and Plain Popsicles only ?  ? Take these medicines the morning of surgery with A SIP OF WATER : ?amLODipine (NORVASC) ?levothyroxine (SYNTHROID, LEVOTHROID) ?May use eye drops. ? ?Do Not take losartan (COZAAR) the morning of surgery.  This is per Anesthesia Department ? ?WHAT DO I DO ABOUT MY DIABETES MEDICATION? ? Ask your PCP or Endocrinologist  about what you should do regarding your Insulin Pump. ? Bring Insulin and extra supplies in case you need to  stop the pump during hospitalization. ? ?How to Manage Your Diabetes ?Before and After Surgery ? ?Why is it important to control my blood sugar before and after surgery? ?Improving blood sugar levels before and after surgery helps healing and can limit problems. ?A way of improving blood sugar control is eating a healthy diet by: ? Eating less sugar and carbohydrates ? Increasing activity/exercise ? Talking with your doctor about reaching your blood sugar goals ?High blood sugars (greater than 180 mg/dL) can raise your risk of infections and slow your recovery, so you will need  to focus on controlling your diabetes during the weeks before surgery. ?Make sure that the doctor who takes care of your diabetes knows about your planned surgery including the date and location. ? ?How do I manage my blood sugar before surgery? ?Check your blood sugar at least 4 times a day, starting 2 days before surgery, to make sure that the level is not too high or low. ?Check your blood sugar the morning of your surgery when you wake up and every 2 hours until you get to the Short Stay unit. ?If your blood sugar is less than 70 mg/dL, you will need to treat for low blood sugar: ?Do not take insulin. ?Treat a low blood sugar (less than 70 mg/dL) with ? cup of clear juice (cranberry or apple), 4 glucose tablets, OR glucose gel. ?Recheck blood sugar in 15 minutes after treatment (to make sure it is greater than 70 mg/dL). If your blood sugar is not greater than 70 mg/dL on recheck, call [phone] ? for further instructions. ?Report your blood sugar to the short stay nurse when you get to Short Stay. ? ?If you are admitted to the hospital after surgery: ?Your blood sugar will be checked by the staff and you will probably be given insulin after surgery (instead of oral diabetes medicines) to make sure you have good blood sugar levels. ?The goal for blood sugar control after  surgery is 80-180 mg/dL. ? ?[facility]- Preparing For Surgery ? ?Before surgery, you can play an important role. Because skin is not sterile, your skin needs to be as free of germs as possible. You can reduce the number of germs on your skin by washing with CHG (chlorahexidine gluconate) Soap before surgery.  CHG is an antiseptic cleaner which kills germs and bonds with the skin to continue killing germs even after washing.   ? ?Oral Hygiene is also important to reduce your risk of infection.  Remember - BRUSH YOUR TEETH THE MORNING OF SURGERY WITH YOUR REGULAR TOOTHPASTE ? ?Please do not use if you have an allergy to CHG or antibacterial  soaps. If your skin becomes reddened/irritated stop using the CHG.  ?Do not shave (including legs and underarms) for at least 48 hours prior to first CHG shower. It is OK to shave your face. ? ?Please follow these instructions carefully. ?  ?Shower the NIGHT BEFORE SURGERY and the MORNING OF SURGERY with CHG.  ? ?If you chose to wash your hair, wash your hair first as usual with your normal shampoo. ? ?After you shampoo, rinse your hair and body thoroughly to remove the shampoo. ? ?Use CHG as you would any other liquid soap. You can apply CHG directly to the skin and wash gently with a scrungie or a clean washcloth.  ? ?Apply the CHG Soap to your body ONLY FROM THE NECK DOWN.  Do not use on open wounds or open sores. Avoid contact with your eyes, ears, mouth and genitals (private parts).  ? ?Wash thoroughly, paying special attention to the area where your surgery will be performed. ? ?Thoroughly rinse your body with warm water from the neck down. ? ?DO NOT shower/wash with your normal soap after using and rinsing off the CHG Soap. ? ?Pat yourself dry with a CLEAN TOWEL. ? ?Wear CLEAN PAJAMAS to bed the night before surgery, wear comfortable clothes the morning of surgery ? ?Place CLEAN SHEETS on your bed the night of your first shower and DO NOT SLEEP WITH PETS. ? ?Day of Surgery: ?Shower as instructed above. ?Dry off with a clean towel. ?Do not wear lotions, powders, or perfumes, or deodorant. ?Please wear clean clothes to the hospital/surgery center.   ?Remember to brush your teeth WITH YOUR REGULAR TOOTHPASTE.  ?           Men may shave face and neck. ? Do not bring valuables to the hospital. ? [facility] is not responsible for any belongings or valuables. ? ?Contacts, dentures or bridgework may not be worn into surgery.  Leave your suitcase in the car.  After surgery it may be brought to your room. ?For patients admitted to the hospital, discharge time will be determined by your treatment team. ?Special  instructions:   ?Please read over the following fact sheets that you were given. ? ? ?NO VISITORS WILL BE ALLOWED IN PRE-OP WHERE PATIENTS ARE PREPPED FOR SURGERY.  ONLY 1 SUPPORT PERSON MAY BE PRESENT IN THE WAITING ROOM WHILE YOU ARE IN SURGERY.  IF YOU ARE TO BE ADMITTED, ONCE YOU ARE IN YOUR ROOM YOU WILL BE ALLOWED TWO (2) VISITORS. 1 (ONE) VISITOR MAY STAY OVERNIGHT BUT MUST ARRIVE TO THE ROOM BY 8pm.  Minor children may have two parents present. Special consideration for safety and communication needs will be reviewed on a case by case basis. ? ?  ? ? ?  ?

## 2021-09-17 NOTE — Progress Notes (Addendum)
PCP - Dr. Irena Reichmann ? ?Cardiologist -  Dr. Chilton Si.  Patient sees 1 time a year, was seen 06/2021 ? ?Endocrine- Dr. Horald Pollen , [facility] ? ?Chest x-ray - na ? ?EKG - 12/14/20 ? ?Stress Test - 2017 ? ?ECHO - 2017 ? ?Cardiac Cath - no ? ?Sleep Study - yes 2013- negative for sleep per PCP ? ?CPAP - no ? ?LABS-CBC, BMP, A1C ? ?ASA-to stop on 09/22/21 ? ?ERAS-yes ? ?HA1C- 07/2021 7.9    09/17/21 7.7 ?Fasting Blood Sugar - varies ?Checks Blood Sugar many, has a Glucose reader._Many__ times a day ? ?Anesthesia-[name] has type 1 Diabetes, patient has an Insulin Pump and a continous glucose reader. [name] is going to call Dr. Horald Pollen to ask for instructions. I instructed [name] to bring extra Insulin and tubing. ? ?Pt denies having chest pain, sob, or fever at this time. All instructions explained to the pt, with a verbal understanding of the material. Pt agrees to go over the instructions while at home for a better understanding. Pt also instructed to self quarantine after being tested for COVID-19. The opportunity to ask questions was provided.  ?

## 2021-09-29 ENCOUNTER — Inpatient Hospital Stay (HOSPITAL_COMMUNITY)
Admission: RE | Admit: 2021-09-29 | Discharge: 2021-09-30 | DRG: 472 | Disposition: A | Discharge status: Home / Self Care | Payer: BC Managed Care – PPO | Attending: Neurological Surgery | Admitting: Neurological Surgery

## 2021-09-29 ENCOUNTER — Inpatient Hospital Stay (HOSPITAL_COMMUNITY): Payer: BC Managed Care – PPO

## 2021-09-29 ENCOUNTER — Other Ambulatory Visit: Payer: Self-pay

## 2021-09-29 ENCOUNTER — Inpatient Hospital Stay (HOSPITAL_COMMUNITY): Payer: BC Managed Care – PPO | Admitting: Anesthesiology

## 2021-09-29 ENCOUNTER — Encounter (HOSPITAL_COMMUNITY): Payer: Self-pay | Admitting: Neurological Surgery

## 2021-09-29 ENCOUNTER — Encounter (HOSPITAL_COMMUNITY)
Admission: RE | Disposition: A | Discharge status: Home / Self Care | Payer: Self-pay | Source: Home / Self Care | Attending: Neurological Surgery

## 2021-09-29 DIAGNOSIS — G9529 Other cord compression: Secondary | ICD-10-CM | POA: Diagnosis present

## 2021-09-29 DIAGNOSIS — G629 Polyneuropathy, unspecified: Secondary | ICD-10-CM | POA: Diagnosis present

## 2021-09-29 DIAGNOSIS — M4712 Other spondylosis with myelopathy, cervical region: Principal | ICD-10-CM | POA: Diagnosis present

## 2021-09-29 DIAGNOSIS — E1042 Type 1 diabetes mellitus with diabetic polyneuropathy: Secondary | ICD-10-CM | POA: Diagnosis present

## 2021-09-29 DIAGNOSIS — M4802 Spinal stenosis, cervical region: Secondary | ICD-10-CM | POA: Diagnosis present

## 2021-09-29 DIAGNOSIS — G959 Disease of spinal cord, unspecified: Secondary | ICD-10-CM | POA: Diagnosis present

## 2021-09-29 DIAGNOSIS — Z79890 Hormone replacement therapy (postmenopausal): Secondary | ICD-10-CM

## 2021-09-29 DIAGNOSIS — Z7982 Long term (current) use of aspirin: Secondary | ICD-10-CM

## 2021-09-29 DIAGNOSIS — N400 Enlarged prostate without lower urinary tract symptoms: Secondary | ICD-10-CM | POA: Diagnosis present

## 2021-09-29 DIAGNOSIS — Z794 Long term (current) use of insulin: Secondary | ICD-10-CM

## 2021-09-29 DIAGNOSIS — E785 Hyperlipidemia, unspecified: Secondary | ICD-10-CM | POA: Diagnosis present

## 2021-09-29 DIAGNOSIS — W010XXA Fall on same level from slipping, tripping and stumbling without subsequent striking against object, initial encounter: Secondary | ICD-10-CM | POA: Diagnosis present

## 2021-09-29 DIAGNOSIS — I10 Essential (primary) hypertension: Secondary | ICD-10-CM | POA: Diagnosis present

## 2021-09-29 DIAGNOSIS — G709 Myoneural disorder, unspecified: Secondary | ICD-10-CM | POA: Diagnosis present

## 2021-09-29 DIAGNOSIS — Z79899 Other long term (current) drug therapy: Secondary | ICD-10-CM

## 2021-09-29 DIAGNOSIS — M2578 Osteophyte, vertebrae: Secondary | ICD-10-CM | POA: Diagnosis present

## 2021-09-29 DIAGNOSIS — Z9641 Presence of insulin pump (external) (internal): Secondary | ICD-10-CM | POA: Diagnosis present

## 2021-09-29 DIAGNOSIS — E039 Hypothyroidism, unspecified: Secondary | ICD-10-CM | POA: Diagnosis present

## 2021-09-29 DIAGNOSIS — H409 Unspecified glaucoma: Secondary | ICD-10-CM | POA: Diagnosis present

## 2021-09-29 DIAGNOSIS — Z808 Family history of malignant neoplasm of other organs or systems: Secondary | ICD-10-CM | POA: Diagnosis not present

## 2021-09-29 DIAGNOSIS — K219 Gastro-esophageal reflux disease without esophagitis: Secondary | ICD-10-CM | POA: Diagnosis present

## 2021-09-29 DIAGNOSIS — E109 Type 1 diabetes mellitus without complications: Principal | ICD-10-CM

## 2021-09-29 DIAGNOSIS — Z01818 Encounter for other preprocedural examination: Secondary | ICD-10-CM

## 2021-09-29 HISTORY — PX: ANTERIOR CERVICAL DECOMPRESSION/DISCECTOMY FUSION 4 LEVELS: SHX5556

## 2021-09-29 LAB — GLUCOSE, CAPILLARY
Glucose-Capillary: 165 mg/dL — ABNORMAL HIGH (ref 70–99)
Glucose-Capillary: 161 mg/dL — ABNORMAL HIGH (ref 70–99)
Glucose-Capillary: 104 mg/dL — ABNORMAL HIGH (ref 70–99)
Glucose-Capillary: 122 mg/dL — ABNORMAL HIGH (ref 70–99)
Glucose-Capillary: 120 mg/dL — ABNORMAL HIGH (ref 70–99)
Glucose-Capillary: 96 mg/dL (ref 70–99)
Glucose-Capillary: 95 mg/dL (ref 70–99)
Glucose-Capillary: 138 mg/dL — ABNORMAL HIGH (ref 70–99)
Glucose-Capillary: 192 mg/dL — ABNORMAL HIGH (ref 70–99)
Glucose-Capillary: 254 mg/dL — ABNORMAL HIGH (ref 70–99)

## 2021-09-29 SURGERY — ANTERIOR CERVICAL DECOMPRESSION/DISCECTOMY FUSION 4 LEVELS
Anesthesia: General

## 2021-09-29 MED ORDER — SUGAMMADEX SODIUM 200 MG/2ML IV SOLN
INTRAVENOUS | Status: DC | PRN
Start: 2021-09-29 — End: 2021-09-29
  Administered 2021-09-29: 200 mg via INTRAVENOUS

## 2021-09-29 MED ORDER — INSULIN PUMP: Freq: Three times a day (TID) | SUBCUTANEOUS | Status: DC

## 2021-09-29 MED ORDER — MORPHINE SULFATE (PF) 2 MG/ML IV SOLN: 2.0000 mg | INTRAVENOUS | Status: DC | PRN

## 2021-09-29 MED ORDER — INSULIN REGULAR(HUMAN) IN NACL 100-0.9 UT/100ML-% IV SOLN
INTRAVENOUS | Status: DC | PRN
  Administered 2021-09-29: 2.8 [IU]/h via INTRAVENOUS

## 2021-09-29 MED ORDER — THROMBIN 5000 UNITS EX SOLR
OROMUCOSAL | Status: DC | PRN
  Administered 2021-09-29: 5 mL via TOPICAL

## 2021-09-29 MED ORDER — BUPIVACAINE HCL (PF) 0.5 % IJ SOLN
INTRAMUSCULAR | Status: DC | PRN
  Administered 2021-09-29: 5 mL

## 2021-09-29 MED ORDER — THROMBIN 5000 UNITS EX SOLR
CUTANEOUS | Status: AC
  Filled 2021-09-29: qty 5000

## 2021-09-29 MED ORDER — POLYETHYLENE GLYCOL 3350 17 G PO PACK: 17.0000 g | PACK | Freq: Every day | ORAL | Status: DC | PRN

## 2021-09-29 MED ORDER — TIMOLOL HEMIHYDRATE 0.5 % OP SOLN
1.0000 [drp] | Freq: Two times a day (BID) | OPHTHALMIC | Status: DC
Start: 2021-09-29 — End: 2021-09-29

## 2021-09-29 MED ORDER — POLYETHYLENE GLYCOL 3350 17 G PO PACK: 17.0000 g | PACK | ORAL | Status: DC

## 2021-09-29 MED ORDER — OXYCODONE-ACETAMINOPHEN 5-325 MG PO TABS: 1.0000 | ORAL_TABLET | Freq: Four times a day (QID) | ORAL | 0 refills | Status: AC | PRN

## 2021-09-29 MED ORDER — ACETAMINOPHEN 500 MG PO TABS
1000.0000 mg | ORAL_TABLET | Freq: Once | ORAL | Status: AC
  Administered 2021-09-29: 1000 mg via ORAL
  Filled 2021-09-29: qty 2

## 2021-09-29 MED ORDER — INSULIN ASPART 100 UNIT/ML IJ SOLN: 100.0000 [IU] | INTRAMUSCULAR | Status: DC

## 2021-09-29 MED ORDER — SODIUM CHLORIDE 0.9% FLUSH: 3.0000 mL | INTRAVENOUS | Status: DC | PRN

## 2021-09-29 MED ORDER — LIDOCAINE 2% (20 MG/ML) 5 ML SYRINGE
INTRAMUSCULAR | Status: DC | PRN
  Administered 2021-09-29: 100 mg via INTRAVENOUS

## 2021-09-29 MED ORDER — FENTANYL CITRATE (PF) 250 MCG/5ML IJ SOLN
INTRAMUSCULAR | Status: DC | PRN
  Administered 2021-09-29: 50 ug via INTRAVENOUS
  Administered 2021-09-29: 150 ug via INTRAVENOUS
  Administered 2021-09-29 (×2): 50 ug via INTRAVENOUS

## 2021-09-29 MED ORDER — FLEET ENEMA 7-19 GM/118ML RE ENEM: 1.0000 | ENEMA | Freq: Once | RECTAL | Status: DC | PRN

## 2021-09-29 MED ORDER — MIDAZOLAM HCL 2 MG/2ML IJ SOLN
INTRAMUSCULAR | Status: AC
  Filled 2021-09-29: qty 2

## 2021-09-29 MED ORDER — AMISULPRIDE (ANTIEMETIC) 5 MG/2ML IV SOLN: 10.0000 mg | Freq: Once | INTRAVENOUS | Status: DC | PRN

## 2021-09-29 MED ORDER — PHENYLEPHRINE HCL-NACL 20-0.9 MG/250ML-% IV SOLN
INTRAVENOUS | Status: DC | PRN
  Administered 2021-09-29: 50 ug/min via INTRAVENOUS

## 2021-09-29 MED ORDER — LACTATED RINGERS IV SOLN: INTRAVENOUS | Status: DC

## 2021-09-29 MED ORDER — CEFAZOLIN SODIUM-DEXTROSE 2-4 GM/100ML-% IV SOLN
2.0000 g | INTRAVENOUS | Status: AC
  Administered 2021-09-29 (×2): 2 g via INTRAVENOUS

## 2021-09-29 MED ORDER — ONDANSETRON HCL 4 MG PO TABS: 4.0000 mg | ORAL_TABLET | Freq: Four times a day (QID) | ORAL | Status: DC | PRN

## 2021-09-29 MED ORDER — POLYVINYL ALCOHOL 1.4 % OP SOLN
1.0000 [drp] | Freq: Two times a day (BID) | OPHTHALMIC | Status: DC
  Administered 2021-09-29: 1 [drp] via OPHTHALMIC
  Filled 2021-09-29: qty 15

## 2021-09-29 MED ORDER — CEFAZOLIN SODIUM-DEXTROSE 2-4 GM/100ML-% IV SOLN
2.0000 g | Freq: Three times a day (TID) | INTRAVENOUS | Status: AC
  Administered 2021-09-29 (×2): 2 g via INTRAVENOUS
  Filled 2021-09-29 (×2): qty 100

## 2021-09-29 MED ORDER — CEFAZOLIN SODIUM-DEXTROSE 2-4 GM/100ML-% IV SOLN
INTRAVENOUS | Status: AC
  Filled 2021-09-29: qty 100

## 2021-09-29 MED ORDER — ACETAMINOPHEN 325 MG PO TABS: 650.0000 mg | ORAL_TABLET | ORAL | Status: DC | PRN

## 2021-09-29 MED ORDER — PROPOFOL 10 MG/ML IV BOLUS
INTRAVENOUS | Status: AC
  Filled 2021-09-29: qty 20

## 2021-09-29 MED ORDER — FENTANYL CITRATE (PF) 250 MCG/5ML IJ SOLN
INTRAMUSCULAR | Status: AC
  Filled 2021-09-29: qty 5

## 2021-09-29 MED ORDER — DEXAMETHASONE SODIUM PHOSPHATE 10 MG/ML IJ SOLN
INTRAMUSCULAR | Status: AC
  Filled 2021-09-29: qty 1

## 2021-09-29 MED ORDER — SODIUM CHLORIDE (PF) 0.9 % IJ SOLN
INTRAMUSCULAR | Status: AC
  Filled 2021-09-29: qty 10

## 2021-09-29 MED ORDER — SODIUM CHLORIDE 0.9 % IV SOLN
250.0000 mL | INTRAVENOUS | Status: DC
  Administered 2021-09-29: 250 mL via INTRAVENOUS

## 2021-09-29 MED ORDER — ONDANSETRON HCL 4 MG/2ML IJ SOLN
INTRAMUSCULAR | Status: AC
  Filled 2021-09-29: qty 2

## 2021-09-29 MED ORDER — TETRAHYDROZOLINE HCL 0.05 % OP SOLN: 1.0000 [drp] | Freq: Two times a day (BID) | OPHTHALMIC | Status: DC

## 2021-09-29 MED ORDER — LEVOTHYROXINE SODIUM 25 MCG PO TABS
125.0000 ug | ORAL_TABLET | Freq: Every day | ORAL | Status: DC
  Administered 2021-09-30: 125 ug via ORAL
  Filled 2021-09-29: qty 1

## 2021-09-29 MED ORDER — SENNA 8.6 MG PO TABS
1.0000 | ORAL_TABLET | Freq: Two times a day (BID) | ORAL | Status: DC
  Administered 2021-09-29 (×2): 8.6 mg via ORAL
  Filled 2021-09-29 (×2): qty 1

## 2021-09-29 MED ORDER — VITAMIN D 25 MCG (1000 UNIT) PO TABS
5000.0000 [IU] | ORAL_TABLET | Freq: Every day | ORAL | Status: DC
  Administered 2021-09-29: 5000 [IU] via ORAL
  Filled 2021-09-29: qty 5

## 2021-09-29 MED ORDER — FENTANYL CITRATE (PF) 100 MCG/2ML IJ SOLN
INTRAMUSCULAR | Status: AC
  Filled 2021-09-29: qty 2

## 2021-09-29 MED ORDER — BISACODYL 10 MG RE SUPP: 10.0000 mg | Freq: Every day | RECTAL | Status: DC | PRN

## 2021-09-29 MED ORDER — PROPOFOL 10 MG/ML IV BOLUS
INTRAVENOUS | Status: DC | PRN
  Administered 2021-09-29: 130 mg via INTRAVENOUS

## 2021-09-29 MED ORDER — LOSARTAN POTASSIUM 50 MG PO TABS
100.0000 mg | ORAL_TABLET | Freq: Every day | ORAL | Status: DC
Start: 2021-09-29 — End: 2021-09-30

## 2021-09-29 MED ORDER — INSULIN INFUSION PUMP DEVI
50.0000 [IU] | Status: DC
Start: 2021-09-29 — End: 2021-09-29

## 2021-09-29 MED ORDER — ACETAMINOPHEN 500 MG PO TABS
1000.0000 mg | ORAL_TABLET | Freq: Four times a day (QID) | ORAL | Status: DC
  Administered 2021-09-29 – 2021-09-30 (×3): 1000 mg via ORAL
  Filled 2021-09-29 (×3): qty 2

## 2021-09-29 MED ORDER — ONDANSETRON HCL 4 MG/2ML IJ SOLN
INTRAMUSCULAR | Status: DC | PRN
  Administered 2021-09-29: 4 mg via INTRAVENOUS

## 2021-09-29 MED ORDER — LIDOCAINE-EPINEPHRINE 1 %-1:100000 IJ SOLN
INTRAMUSCULAR | Status: DC | PRN
  Administered 2021-09-29: 5 mL

## 2021-09-29 MED ORDER — ROCURONIUM BROMIDE 10 MG/ML (PF) SYRINGE
PREFILLED_SYRINGE | INTRAVENOUS | Status: DC | PRN
Start: 2021-09-29 — End: 2021-09-29
  Administered 2021-09-29: 30 mg via INTRAVENOUS
  Administered 2021-09-29 (×2): 50 mg via INTRAVENOUS

## 2021-09-29 MED ORDER — ASPIRIN EC 81 MG PO TBEC
81.0000 mg | DELAYED_RELEASE_TABLET | Freq: Every day | ORAL | Status: DC
Start: 2021-09-29 — End: 2021-09-30
  Administered 2021-09-29: 81 mg via ORAL
  Filled 2021-09-29: qty 1

## 2021-09-29 MED ORDER — METHOCARBAMOL 1000 MG/10ML IJ SOLN
500.0000 mg | Freq: Four times a day (QID) | INTRAVENOUS | Status: DC | PRN
  Filled 2021-09-29: qty 5

## 2021-09-29 MED ORDER — MIDAZOLAM HCL 5 MG/5ML IJ SOLN
INTRAMUSCULAR | Status: DC | PRN
  Administered 2021-09-29: 2 mg via INTRAVENOUS

## 2021-09-29 MED ORDER — DOCUSATE SODIUM 100 MG PO CAPS
100.0000 mg | ORAL_CAPSULE | Freq: Two times a day (BID) | ORAL | Status: DC
  Administered 2021-09-29 (×2): 100 mg via ORAL
  Filled 2021-09-29 (×2): qty 1

## 2021-09-29 MED ORDER — ROSUVASTATIN CALCIUM 5 MG PO TABS
5.0000 mg | ORAL_TABLET | Freq: Every day | ORAL | Status: DC
  Administered 2021-09-29: 5 mg via ORAL
  Filled 2021-09-29: qty 1

## 2021-09-29 MED ORDER — SODIUM CHLORIDE 0.9% FLUSH
3.0000 mL | Freq: Two times a day (BID) | INTRAVENOUS | Status: DC
  Administered 2021-09-29 (×2): 3 mL via INTRAVENOUS

## 2021-09-29 MED ORDER — PHENOL 1.4 % MT LIQD
1.0000 | OROMUCOSAL | Status: DC | PRN
  Administered 2021-09-29: 1 via OROMUCOSAL
  Filled 2021-09-29: qty 177

## 2021-09-29 MED ORDER — AMLODIPINE BESYLATE 5 MG PO TABS: 5.0000 mg | ORAL_TABLET | Freq: Every day | ORAL | Status: DC

## 2021-09-29 MED ORDER — CHLORHEXIDINE GLUCONATE CLOTH 2 % EX PADS: 6.0000 | MEDICATED_PAD | Freq: Once | CUTANEOUS | Status: DC

## 2021-09-29 MED ORDER — MENTHOL 3 MG MT LOZG: 1.0000 | LOZENGE | OROMUCOSAL | Status: DC | PRN

## 2021-09-29 MED ORDER — CEFAZOLIN SODIUM 1 G IJ SOLR
INTRAMUSCULAR | Status: AC
  Filled 2021-09-29: qty 20

## 2021-09-29 MED ORDER — TIMOLOL MALEATE 0.25 % OP SOLN
1.0000 [drp] | Freq: Two times a day (BID) | OPHTHALMIC | Status: DC
  Administered 2021-09-29: 1 [drp] via OPHTHALMIC
  Filled 2021-09-29: qty 5

## 2021-09-29 MED ORDER — INSULIN PUMP
Freq: Three times a day (TID) | SUBCUTANEOUS | Status: DC
  Administered 2021-09-29: 2.9 via SUBCUTANEOUS
  Administered 2021-09-29: 1.5 via SUBCUTANEOUS
  Administered 2021-09-30: 3.7 via SUBCUTANEOUS
  Administered 2021-09-30: 0.7 via SUBCUTANEOUS
  Filled 2021-09-29: qty 1

## 2021-09-29 MED ORDER — DEXAMETHASONE SODIUM PHOSPHATE 10 MG/ML IJ SOLN
INTRAMUSCULAR | Status: DC | PRN
  Administered 2021-09-29: 5 mg via INTRAVENOUS

## 2021-09-29 MED ORDER — CALCIUM CARBONATE ANTACID 500 MG PO CHEW: 2.0000 | CHEWABLE_TABLET | Freq: Every day | ORAL | Status: DC | PRN

## 2021-09-29 MED ORDER — METHOCARBAMOL 500 MG PO TABS
500.0000 mg | ORAL_TABLET | Freq: Four times a day (QID) | ORAL | Status: DC | PRN
  Administered 2021-09-29 – 2021-09-30 (×2): 500 mg via ORAL
  Filled 2021-09-29 (×2): qty 1

## 2021-09-29 MED ORDER — ACETAMINOPHEN 650 MG RE SUPP: 650.0000 mg | RECTAL | Status: DC | PRN

## 2021-09-29 MED ORDER — CHLORHEXIDINE GLUCONATE 0.12 % MT SOLN
15.0000 mL | Freq: Once | OROMUCOSAL | Status: AC
  Administered 2021-09-29: 15 mL via OROMUCOSAL
  Filled 2021-09-29: qty 15

## 2021-09-29 MED ORDER — ONDANSETRON HCL 4 MG/2ML IJ SOLN: 4.0000 mg | Freq: Four times a day (QID) | INTRAMUSCULAR | Status: DC | PRN

## 2021-09-29 MED ORDER — LIDOCAINE-EPINEPHRINE 1 %-1:100000 IJ SOLN
INTRAMUSCULAR | Status: AC
  Filled 2021-09-29: qty 1

## 2021-09-29 MED ORDER — TADALAFIL 5 MG PO TABS
5.0000 mg | ORAL_TABLET | Freq: Every day | ORAL | Status: DC
  Filled 2021-09-29 (×2): qty 1

## 2021-09-29 MED ORDER — FENTANYL CITRATE (PF) 100 MCG/2ML IJ SOLN
25.0000 ug | INTRAMUSCULAR | Status: DC | PRN
  Administered 2021-09-29: 50 ug via INTRAVENOUS

## 2021-09-29 MED ORDER — BUPIVACAINE HCL (PF) 0.5 % IJ SOLN
INTRAMUSCULAR | Status: AC
  Filled 2021-09-29: qty 30

## 2021-09-29 MED ORDER — ORAL CARE MOUTH RINSE: 15.0000 mL | Freq: Once | OROMUCOSAL | Status: AC

## 2021-09-29 MED ORDER — ROCURONIUM BROMIDE 10 MG/ML (PF) SYRINGE
PREFILLED_SYRINGE | INTRAVENOUS | Status: AC
  Filled 2021-09-29: qty 20

## 2021-09-29 MED ORDER — OXYCODONE-ACETAMINOPHEN 5-325 MG PO TABS
1.0000 | ORAL_TABLET | ORAL | Status: DC | PRN
  Administered 2021-09-29: 1 via ORAL
  Filled 2021-09-29: qty 1
  Filled 2021-09-29: qty 2

## 2021-09-29 MED ORDER — DIAZEPAM 5 MG PO TABS: 5.0000 mg | ORAL_TABLET | Freq: Four times a day (QID) | ORAL | 0 refills | Status: AC | PRN

## 2021-09-29 MED ORDER — 0.9 % SODIUM CHLORIDE (POUR BTL) OPTIME
TOPICAL | Status: DC | PRN
  Administered 2021-09-29: 1000 mL

## 2021-09-29 SURGICAL SUPPLY — 63 items
ADH SKN CLS APL DERMABOND .7 (GAUZE/BANDAGES/DRESSINGS) ×1
BAG COUNTER SPONGE SURGICOUNT (BAG) ×4 IMPLANT
BAG SPNG CNTER NS LX DISP (BAG) ×2
BAND INSRT 18 STRL LF DISP RB (MISCELLANEOUS)
BAND RUBBER #18 3X1/16 STRL (MISCELLANEOUS) IMPLANT
BIT DRILL ACP 15 (DRILL) IMPLANT
BIT DRILL NEURO 2X3.1 SFT TUCH (MISCELLANEOUS) ×2 IMPLANT
BLADE SURG 15 STRL LF DISP TIS (BLADE) IMPLANT
BLADE SURG 15 STRL SS (BLADE) ×2
BNDG GAUZE ELAST 4 BULKY (GAUZE/BANDAGES/DRESSINGS) IMPLANT
BUR BARREL STRAIGHT FLUTE 4.0 (BURR) ×1 IMPLANT
CAGE CERV MOD 6X17X14 7D (Cage) ×4 IMPLANT
CANISTER SUCT 3000ML PPV (MISCELLANEOUS) ×3 IMPLANT
COLLAR CERV LO CONTOUR FIRM DE (SOFTGOODS) ×1 IMPLANT
COVER BACK TABLE 60X90IN (DRAPES) ×1 IMPLANT
DECANTER SPIKE VIAL GLASS SM (MISCELLANEOUS) ×2 IMPLANT
DERMABOND ADVANCED (GAUZE/BANDAGES/DRESSINGS) ×1
DERMABOND ADVANCED .7 DNX12 (GAUZE/BANDAGES/DRESSINGS) ×2 IMPLANT
DRAPE HALF SHEET 40X57 (DRAPES) ×2 IMPLANT
DRAPE LAPAROTOMY 100X72 PEDS (DRAPES) ×3 IMPLANT
DRAPE MICROSCOPE LEICA (MISCELLANEOUS) IMPLANT
DRILL ACP 15 (DRILL) ×2
DRILL NEURO 2X3.1 SOFT TOUCH (MISCELLANEOUS) ×2
DURAPREP 6ML APPLICATOR 50/CS (WOUND CARE) ×3 IMPLANT
ELECT COATED BLADE 2.86 ST (ELECTRODE) ×3 IMPLANT
ELECT REM PT RETURN 9FT ADLT (ELECTROSURGICAL) ×2
ELECTRODE REM PT RTRN 9FT ADLT (ELECTROSURGICAL) ×2 IMPLANT
GAUZE 4X4 16PLY ~~LOC~~+RFID DBL (SPONGE) ×1 IMPLANT
GLOVE EXAM NITRILE XL STR (GLOVE) IMPLANT
GLOVE SURG LTX SZ6.5 (GLOVE) ×1 IMPLANT
GLOVE SURG LTX SZ7.5 (GLOVE) ×2 IMPLANT
GLOVE SURG LTX SZ8.5 (GLOVE) ×4 IMPLANT
GLOVE SURG LTX SZ9 (GLOVE) ×1 IMPLANT
GLOVE SURG POLYISO LF SZ7.5 (GLOVE) ×2 IMPLANT
GLOVE SURG UNDER POLY LF SZ7 (GLOVE) ×3 IMPLANT
GLOVE SURG UNDER POLY LF SZ8.5 (GLOVE) ×3 IMPLANT
GOWN STRL REUS W/ TWL LRG LVL3 (GOWN DISPOSABLE) IMPLANT
GOWN STRL REUS W/ TWL XL LVL3 (GOWN DISPOSABLE) ×2 IMPLANT
GOWN STRL REUS W/TWL 2XL LVL3 (GOWN DISPOSABLE) ×3 IMPLANT
GOWN STRL REUS W/TWL LRG LVL3 (GOWN DISPOSABLE) ×6
GOWN STRL REUS W/TWL XL LVL3 (GOWN DISPOSABLE) ×4
HALTER HD/CHIN CERV TRACTION D (MISCELLANEOUS) ×3 IMPLANT
HEMOSTAT POWDER KIT SURGIFOAM (HEMOSTASIS) ×4 IMPLANT
KIT BASIN OR (CUSTOM PROCEDURE TRAY) ×3 IMPLANT
KIT TURNOVER KIT B (KITS) ×3 IMPLANT
NDL SPNL 22GX3.5 QUINCKE BK (NEEDLE) ×2 IMPLANT
NEEDLE HYPO 22GX1.5 SAFETY (NEEDLE) ×3 IMPLANT
NEEDLE SPNL 22GX3.5 QUINCKE BK (NEEDLE) ×2 IMPLANT
NS IRRIG 1000ML POUR BTL (IV SOLUTION) ×3 IMPLANT
PACK LAMINECTOMY NEURO (CUSTOM PROCEDURE TRAY) ×3 IMPLANT
PAD ARMBOARD 7.5X6 YLW CONV (MISCELLANEOUS) ×9 IMPLANT
PATTIES SURGICAL .5 X1 (DISPOSABLE) ×4 IMPLANT
PIN ACP TEMP FIXATION (EXFIX) ×1 IMPLANT
PLATE ACP 2.1X74 4LVL (Plate) ×1 IMPLANT
PUTTY DBM PROPEL MEDIUM (Putty) ×1 IMPLANT
SCREW ACP VA ST 3.5X15 (Screw) ×10 IMPLANT
SET WALTER ACTIVATION W/DRAPE (SET/KITS/TRAYS/PACK) ×3 IMPLANT
SPONGE INTESTINAL PEANUT (DISPOSABLE) ×3 IMPLANT
SUT VIC AB 4-0 RB1 18 (SUTURE) ×6 IMPLANT
TOWEL GREEN STERILE (TOWEL DISPOSABLE) ×3 IMPLANT
TOWEL GREEN STERILE FF (TOWEL DISPOSABLE) ×3 IMPLANT
TRAY FOLEY MTR SLVR 16FR STAT (SET/KITS/TRAYS/PACK) ×1 IMPLANT
WATER STERILE IRR 1000ML POUR (IV SOLUTION) ×3 IMPLANT

## 2021-09-29 NOTE — Anesthesia Procedure Notes (Signed)
Procedure Name: Intubation ?Date/Time: 09/29/2021 7:52 AM ?Performed by: Kyung Rudd, CRNA ?Pre-anesthesia Checklist: Patient identified, Emergency Drugs available, Suction available and Patient being monitored ?Patient Re-evaluated:Patient Re-evaluated prior to induction ?Oxygen Delivery Method: Circle system utilized ?Preoxygenation: Pre-oxygenation with 100% oxygen ?Induction Type: IV induction ?Ventilation: Mask ventilation without difficulty ?Laryngoscope Size: Glidescope and 4 ?Tube type: Oral ?Tube size: 7.5 mm ?Number of attempts: 1 ?Airway Equipment and Method: Stylet and Video-laryngoscopy ?Placement Confirmation: ETT inserted through vocal cords under direct vision, positive ETCO2 and breath sounds checked- equal and bilateral ?Secured at: 24 cm ?Tube secured with: Tape ?Dental Injury: Teeth and Oropharynx as per pre-operative assessment  ? ? ? ? ?

## 2021-09-29 NOTE — Progress Notes (Signed)
Orthopedic Tech Progress Note ?Patient Details:  ?Tom Baker ?02-25-1956 ?[id number] ? ?RN stated " patient has collar" ? ?Patient ID: Tom Baker, male   DOB: Nov 11, 1955, 66 y.o.   MRN: [id number] ? ?Tom Baker ?09/29/2021, 3:39 PM ? ?

## 2021-09-29 NOTE — H&P (Signed)
Tom Baker is an 66 y.o. male.   ?Chief Complaint: Steadiness of gait progressive weakness in the upper extremities ?HPI: Tom Baker is a 66 year old individual who sustained a hip fracture when he had a fall from height last year.  He notes that he tripped over a dog leash as his dog was getting away from him however it was questionable to the orthopedist how this occurred in his orthopedist noted that he had some fasciculations in his lower extremities and significant wasting in his hand muscles this raised the alarm of some cervical spondylitic disease and ultimately an MRI of the cervical spine was performed which demonstrates that the patient has advanced spondylitic stenosis at C3 44556 and 6 7.  I consulted at the time and noted to the patient that he has cord compression over these levels with significant atrophy of his cord which is likely causing problems with his gait and the weakness in his arms on examination after the patient recovered from his hip surgery and fracture it was noted that he had a markedly myelopathic gait.  I advised surgical decompression and stabilization of the cervical spine across the 4 levels involved C3 44556 and 6 7.  A CT scan was performed which showed that patient had some focal calcifications at the disc space but not true ossification of the posterior longitudinal ligament.  I discussed the fact that he might require a corpectomy at the C5 level.  There is old been evaluated fully and he is now admitted for surgical decompression and arthrodesis of the cervical spine. ? ?Past Medical History:  ?Diagnosis Date  ? BPH (benign prostatic hyperplasia)   ? Complication of anesthesia   ? hard to urinate after surgery ankle surgery 2005 [facility]  ? Diabetes mellitus type 1 (HCC)   ? GERD (gastroesophageal reflux disease)   ? Glaucoma   ? left eye  ? History of blood transfusion 12/2020  ? Hyperlipidemia   ? Hypertension   ? Hypothyroid   ? Pericardial effusion 2017  ? Pneumonia  2017  ? Post-nasal drip 08/02/2016  ? Runny nose   ? occ at times pe pt  ? Wears glasses   ? ? ?Past Surgical History:  ?Procedure Laterality Date  ? ANKLE SURGERY Right 2010  ? [facility] plate and 6 screws still in  ? CHOLECYSTECTOMY  1995  ? laparoscopic  ? CYSTOSCOPY WITH INSERTION OF UROLIFT N/A 09/16/2020  ? Procedure: CYSTOSCOPY WITH INSERTION OF UROLIFT; URETHRAL DILATION;  Surgeon: Jerilee Field, MD;  Location: [facility];  Service: Urology;  Laterality: N/A;  ? EYE SURGERY Left 2003  ? lens replacement for cataract  ? INTRAMEDULLARY (IM) NAIL INTERTROCHANTERIC Right 12/13/2020  ? Procedure: INTRAMEDULLARY (IM) NAIL INTERTROCHANTRIC;  Surgeon: Myrene Galas, MD;  Location: [facility] OR;  Service: Orthopedics;  Laterality: Right;  ? KNEE SURGERY Right 1988  ? arthroscopic  ? radioactive iodine to thryoid  2000  ? TOE SURGERY Left 1972  ? great toe  ? VITRECTOMY  1999  ? ? ?Family History  ?Problem Relation Age of Onset  ? Bone cancer Father   ? ?Social History:  reports that he has never smoked. He has never used smokeless tobacco. He reports that he does not drink alcohol and does not use drugs. ? ?Allergies: No Known Allergies ? ?Medications Prior to Admission  ?Medication Sig Dispense Refill  ? amLODipine (NORVASC) 5 MG tablet Take 1 tablet (5 mg total) by mouth daily. 30 tablet 0  ?  aspirin EC 81 MG tablet Take 81 mg by mouth at bedtime.    ? Cholecalciferol 125 MCG (5000 UT) TABS Take 1 tablet (5,000 Units total) by mouth daily. 30 tablet 6  ? insulin aspart (NOVOLOG) 100 UNIT/ML injection Inject into the skin continuous. Use in insulin pump    ? Insulin Infusion Pump DEVI 50 Units by Does not apply route continuous. Novolog Insulin    ? levothyroxine (SYNTHROID, LEVOTHROID) 125 MCG tablet Take 125 mcg by mouth daily.    ? losartan (COZAAR) 100 MG tablet Take 100 mg by mouth daily.    ? Polyethylene Glycol 3350 (MIRALAX PO) Take 17 g by mouth every other day. At night    ? rosuvastatin  (CRESTOR) 5 MG tablet TAKE 1 TABLET(5 MG) BY MOUTH DAILY AT 6 PM 60 tablet 6  ? tadalafil (CIALIS) 5 MG tablet Take 5 mg by mouth daily.    ? Tetrahydrozoline HCl (VISINE OP) Place 1-2 drops into both eyes 2 (two) times daily.    ? timolol (BETIMOL) 0.5 % ophthalmic solution Place 1 drop into the left eye 2 (two) times daily.    ? calcium carbonate (TUMS - DOSED IN MG ELEMENTAL CALCIUM) 500 MG chewable tablet Chew 2 tablets by mouth daily as needed for indigestion or heartburn.    ? Lancets (ONETOUCH ULTRASOFT) lancets 1 each by Other route 6 (six) times daily.  0  ? ONETOUCH VERIO test strip 1 each by Other route 6 (six) times daily.    ? ? ?Results for orders placed or performed during the hospital encounter of 09/29/21 (from the past 48 hour(s))  ?Glucose, capillary     Status: Abnormal  ? Collection Time: 09/29/21  6:00 AM  ?Result Value Ref Range  ? Glucose-Capillary 165 (H) 70 - 99 mg/dL  ?  Comment: Glucose reference range applies only to samples taken after fasting for at least 8 hours.  ?Glucose, capillary     Status: Abnormal  ? Collection Time: 09/29/21  7:23 AM  ?Result Value Ref Range  ? Glucose-Capillary 161 (H) 70 - 99 mg/dL  ?  Comment: Glucose reference range applies only to samples taken after fasting for at least 8 hours.  ? ?No results found. ? ?Review of Systems  ?Constitutional:  Positive for activity change.  ?HENT: Negative.    ?Musculoskeletal:  Positive for gait problem and neck stiffness.  ?Skin: Negative.   ?All other systems reviewed and are negative. ? ?Blood pressure (!) 141/80, pulse 73, temperature 98.5 ?F (36.9 ?C), temperature source Oral, resp. rate 18, height 6\' 3"  (1.905 m), weight 87.4 kg, SpO2 100 %. ?Physical Exam ?Constitutional:   ?   Appearance: Normal appearance. He is normal weight.  ?HENT:  ?   Head: Normocephalic and atraumatic.  ?   Right Ear: Tympanic membrane normal.  ?   Left Ear: Tympanic membrane normal.  ?   Nose: Nose normal.  ?   Mouth/Throat:  ?   Mouth:  Mucous membranes are moist.  ?   Pharynx: Oropharynx is clear.  ?Eyes:  ?   Extraocular Movements: Extraocular movements intact.  ?   Conjunctiva/sclera: Conjunctivae normal.  ?   Pupils: Pupils are equal, round, and reactive to light.  ?Neck:  ?   Comments: Decreased range of motion turning 45 degrees left and right flexing and extending 50% of normal. ?Cardiovascular:  ?   Rate and Rhythm: Normal rate and regular rhythm.  ?   Pulses: Normal pulses.  ?  Heart sounds: Normal heart sounds.  ?Pulmonary:  ?   Effort: Pulmonary effort is normal.  ?   Breath sounds: Normal breath sounds.  ?Abdominal:  ?   General: Abdomen is flat. Bowel sounds are normal.  ?   Palpations: Abdomen is soft.  ?Musculoskeletal:     ?   General: Normal range of motion.  ?Skin: ?   General: Skin is warm and dry.  ?   Capillary Refill: Capillary refill takes less than 2 seconds.  ?Neurological:  ?   Mental Status: He is alert.  ?   Comments: Cranial nerve examination is normal upper extremities reveal atrophy in the intrinsic muscles of the hand bilaterally.  Biceps and triceps strength appears 4+ out of 5 with absent reflexes in the biceps and the triceps.  In the lower extremities patient has evidence of some fasciculations in the major muscle groups he has 3+ reflexes in the Achilles bilaterally with spontaneous clonus.  ?  ? 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 ? ?Plan: Anterior cervical decompression arthrodesis C3-4 C4-5 C5-6 and C6-C7. ? ?Stefani Dama, MD ?09/29/2021, 7:44 AM ? ? ? ?

## 2021-09-29 NOTE — Anesthesia Preprocedure Evaluation (Addendum)
Anesthesia Evaluation  ?Patient identified by MRN, date of birth, ID band ?Patient awake ? ? ? ?Reviewed: ?Allergy & Precautions, NPO status , Patient's Chart, lab work & pertinent test results ? ?Airway ?Mallampati: II ? ?TM Distance: >3 FB ?Neck ROM: Full ? ? ? Dental ? ?(+) Dental Advisory Given ?  ?Pulmonary ?neg pulmonary ROS,  ?  ?breath sounds clear to auscultation ? ? ? ? ? ? Cardiovascular ?hypertension, Pt. on medications ? ?Rhythm:Regular Rate:Normal ? ? ?  ?Neuro/Psych ? Neuromuscular disease   ? GI/Hepatic ?Neg liver ROS, GERD  ,  ?Endo/Other  ?diabetes, Type 1Hypothyroidism  ? Renal/GU ?CRFRenal disease  ? ?  ?Musculoskeletal ? ? Abdominal ?  ?Peds ? Hematology ?  ?Anesthesia Other Findings ? ? Reproductive/Obstetrics ? ?  ? ? ? ? ? ? ? ? ? ? ? ? ? ?  ?  ? ? ? ? ? ? ? ?Anesthesia Physical ?Anesthesia Plan ? ?ASA: 2 ? ?Anesthesia Plan: General  ? ?Post-op Pain Management: Tylenol PO (pre-op)*  ? ?Induction: Intravenous ? ?PONV Risk Score and Plan: 2 and Dexamethasone, Ondansetron and Treatment may vary due to age or medical condition ? ?Airway Management Planned: Oral ETT and Video Laryngoscope Planned ? ?Additional Equipment: Arterial line ? ?Intra-op Plan:  ? ?Post-operative Plan: Extubation in OR ? ?Informed Consent: I have reviewed the patients History and Physical, chart, labs and discussed the procedure including the risks, benefits and alternatives for the proposed anesthesia with the patient or authorized representative who has indicated his/her understanding and acceptance.  ? ? ? ?Dental advisory given ? ?Plan Discussed with: CRNA ? ?Anesthesia Plan Comments: ([place].)  ? ? ? ? ? ?Anesthesia Quick Evaluation ? ?

## 2021-09-29 NOTE — Progress Notes (Signed)
Foley cath removed per Dr. Ellene Route postop @ 1238 ?

## 2021-09-29 NOTE — Op Note (Signed)
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 ?Postoperative diagnosis: Same ?Procedure: Anterior cervical decompression C3-4 C4-5 C5-6 and C6-C7 anterior plate fixation with ACP plate. 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  After he sustained a hip fracture it was noted that he had significant cervical spondylitic disease and MRI demonstrated presence of cord compression at C3 44556 and 6 7.  He was seen in consultation at that time and after he has had a chance to recover from his hip surgery he is now been advised regarding the need for surgical decompression and stabilization of his cervical spine. ? ?Procedure the patient was brought to the operating room and placed on the table in supine position.  After the smooth induction of general endotracheal anesthesia he was placed in 5 pounds of halter traction neck was prepped with alcohol DuraPrep and draped in a sterile fashion.  Transverse incision was made in the midportion of the neck and the dissection was taken down under the platysma which was very thin and this man the dissection was then carried cephalad and inferiorly to expose the lateral portion of the neck and the plane between the sternocleidomastoid and strap muscles was dissected bluntly until the prevertebral space was reached.  First identifiable disc space was identified positively as C4-5.  Then the dissection was carried cephalad to allow stripping of the longus coli muscle off of either side of midline to allow placement of permanent retractor of a Caspari type to expose C3-4.  Procedure was started C3-4 by opening the anterior longitudinal ligament removing some ventral osteophytes and entering the disc space.  The disc space  was evacuated of a significant quantity of severely degenerated desiccated disc material until the region of the posterior longitudinal ligament was reached this was then decompressed in the subligamentous fashion with a large ventral osteophyte from the inferior area at C3 being removed in a similar osteophyte being removed from C4.  Ligament itself was thin in this area and was not felt after this decompression at the ligament itself needed to be opened the interspace was then sized for an appropriate size spacer and a 6 mm tall modulus spacer measuring 14 x 17 mm in size and 7 degrees of lordosis was filled with demineralized bone matrix and placed into the interspace.  Lateral gutters were also filled with demineralized bone matrix.  Attention was then turned to C4-5 here the ligament was noted to be very calcified and is it was entered the undersurface was noted be fairly adherent to the dura careful dissection between the ligament and the dura was undertaken to remove the calcific portion of the disc and ligament and alleviate pressure on the ventral aspect of the cord this took some time and then undercutting of the C4 vertebrae was substantial as was undercutting of the superior end of C5.  Once the decompression was completed the same size graft measuring 6 x 17 x 14 mm was filled with demineralized bone matrix and placed into the interval the next 2 levels were decompressed similarly C5-6 was an easy decompression with a calcified ligament lifting freely off the dura and removal of it and removed significant calcific portions that was decompressing the spinal canal.  With that C6-7 was completed in a similar fashion the same size grafts were placed  there measuring 6 x 17 x 14 mm with 7 degrees lordosis once all the grafts were placed hemostasis in the soft tissue was checked and a 74 mm long ACP plate was fixed to the ventral aspect of the vertebral bodies with 10 locking 3.5 x 15 mm screws.  The plate was  locked to the screws and final lateral C-spine x-ray was obtained demonstrating good lengthening of the spine and good decompression of the interspaces.  With this hemostasis in the soft tissues was again manually checked and when verified the platysma was closed with 4-0 Vicryl interrupted fashion 4-0 Vicryl was used in the subcuticular skin Dermabond was placed on the skin.  Blood loss for the procedure was estimated 200 cc. ?

## 2021-09-29 NOTE — Anesthesia Procedure Notes (Addendum)
Arterial Line Insertion ?Start/End3/28/2023 7:15 AM, 09/29/2021 7:20 AM ?Performed by: Lonia Mad, CRNA, CRNA ? Patient location: Pre-op. ?Preanesthetic checklist: patient identified, IV checked, site marked, risks and benefits discussed, surgical consent, monitors and equipment checked, pre-op evaluation and timeout performed ?Lidocaine 1% used for infiltration ?Right, radial was placed ?Catheter size: 20 G ?Hand hygiene performed , maximum sterile barriers used  and Seldinger technique used ? ?Attempts: 1 ?Procedure performed without using ultrasound guided technique. ?Following insertion, Biopatch and dressing applied. ?Post procedure assessment: normal ? ?Patient tolerated the procedure well with no immediate complications. ? ? ?

## 2021-09-29 NOTE — Transfer of Care (Signed)
Immediate Anesthesia Transfer of Care Note ? ?Patient: Tom Baker ? ?Procedure(s) Performed: Cervical Three-Four,  Cervical Four-Five,  Cervical Five-Six,  Cervical Six--Seven  Anterior Cervical Discectomy Fusion  with possible Corpectomy-Cervical Five ? ?Patient Location: PACU ? ?Anesthesia Type:General ? ?Level of Consciousness: awake, alert  and oriented ? ?Airway & Oxygen Therapy: Patient Spontanous Breathing and Patient connected to nasal cannula oxygen ? ?Post-op Assessment: Report given to RN, Post -op Vital signs reviewed and stable and Patient moving all extremities X 4 ? ?Post vital signs: Reviewed and stable ? ?Last Vitals:  ?Vitals Value Taken Time  ?BP 131/70 09/29/21 1247  ?Temp    ?Pulse 91 09/29/21 1254  ?Resp 14 09/29/21 1254  ?SpO2 100 % 09/29/21 1254  ?Vitals shown include unvalidated device data. ? ?Last Pain:  ?Vitals:  ? 09/29/21 0630  ?TempSrc:   ?PainSc: 0-No pain  ?   ? ?Patients Stated Pain Goal: 0 (09/29/21 0630) ? ?Complications: No notable events documented. ?

## 2021-09-29 NOTE — Anesthesia Postprocedure Evaluation (Signed)
Anesthesia Post Note ? ?Patient: Tom Baker ? ?Procedure(s) Performed: Cervical Three-Four,  Cervical Four-Five,  Cervical Five-Six,  Cervical Six--Seven  Anterior Cervical Discectomy Fusion  with possible Corpectomy-Cervical Five ? ?  ? ?Patient location during evaluation: PACU ?Anesthesia Type: General ?Level of consciousness: awake and alert ?Pain management: pain level controlled ?Vital Signs Assessment: post-procedure vital signs reviewed and stable ?Respiratory status: spontaneous breathing, nonlabored ventilation, respiratory function stable and patient connected to nasal cannula oxygen ?Cardiovascular status: blood pressure returned to baseline and stable ?Postop Assessment: no apparent nausea or vomiting ?Anesthetic complications: no ? ? ?No notable events documented. ? ?Last Vitals:  ?Vitals:  ? 09/29/21 1353 09/29/21 1503  ?BP: (!) 112/56 (!) 93/46  ?Pulse: 92 99  ?Resp: 12 18  ?Temp: (!) 36.3 ?C 36.5 ?C  ?SpO2: 100% 100%  ?  ?Last Pain:  ?Vitals:  ? 09/29/21 1503  ?TempSrc: Oral  ?PainSc:   ? ? ?  ?  ?  ?  ?  ?  ? ?Marcene Duos E ? ? ? ? ?

## 2021-09-29 NOTE — Discharge Summary (Signed)
Physician Discharge Summary  ?Patient ID: ?Tom Baker ?MRN: [id number] ?DOB/AGE: 67-Apr-1957 67 y.o. ? ?Admit date: 09/29/2021 ?Discharge date: 09/30/2021 ? ?Admission Diagnoses: Cervical spondylosis with myelopathy C3-4 C4-5 C5-6 and C6-C7.  Diabetes mellitus type 1. ? ?Discharge Diagnoses: Cervical spondylosis with myelopathy C3-4 C4-5 C5-6 and C6-C7.  Diabetes mellitus type 1. ?Principal Problem: ?  Cervical myelopathy ([place]) ? ? ?Discharged Condition: good ? ?Hospital Course: Patient was admitted to undergo anterior cervical decompression arthrodesis which he tolerated well. ? ?Consults: None ? ?Significant Diagnostic Studies: None ? ?Treatments: surgery: See op note ? ?Discharge Exam: ?Blood pressure 105/65, pulse (!) 109, temperature 98.9 ?F (37.2 ?C), temperature source Oral, resp. rate 20, height 6\' 3"  (1.905 m), weight 87.4 kg, SpO2 100 %. ?Incision is clean and dry Station and gait are intact though patient has notable spasticity from longstanding myelopathy.  This is not worse than it was before surgery. ? ?Disposition: Discharge disposition: 01-Home or Self Care ? ? ? ? ? ? ?Discharge Instructions   ? ? Call MD for:  redness, tenderness, or signs of infection (pain, swelling, redness, odor or green/yellow discharge around incision site)   Complete by: As directed ?  ? Call MD for:  severe uncontrolled pain   Complete by: As directed ?  ? Call MD for:  temperature >100.4   Complete by: As directed ?  ? Diet - low sodium heart healthy   Complete by: As directed ?  ? Discharge wound care:   Complete by: As directed ?  ? Okay to shower. Do not apply salves or appointments to incision. No heavy lifting with the upper extremities greater than 10 pounds. May resume driving when not requiring pain medication and patient feels comfortable with doing so.  ? Incentive spirometry RT   Complete by: As directed ?  ? Increase activity slowly   Complete by: As directed ?  ? ?  ? ?Allergies as of 09/29/2021   ?No Known  Allergies ?  ? ?  ?Medication List  ?  ? ?TAKE these medications   ? ?amLODipine 5 MG tablet ?Commonly known as: NORVASC ?Take 1 tablet (5 mg total) by mouth daily. ?  ?aspirin EC 81 MG tablet ?Take 81 mg by mouth at bedtime. ?  ?calcium carbonate 500 MG chewable tablet ?Commonly known as: TUMS - dosed in mg elemental calcium ?Chew 2 tablets by mouth daily as needed for indigestion or heartburn. ?  ?diazepam 5 MG tablet ?Commonly known as: Valium ?Take 1 tablet (5 mg total) by mouth every 6 (six) hours as needed for muscle spasms. ?  ?insulin aspart 100 UNIT/ML injection ?Commonly known as: novoLOG ?Inject into the skin continuous. Use in insulin pump ?  ?Insulin Infusion Pump Devi ?50 Units by Does not apply route continuous. Novolog Insulin ?  ?levothyroxine 125 MCG tablet ?Commonly known as: SYNTHROID ?Take 125 mcg by mouth daily. ?  ?losartan 100 MG tablet ?Commonly known as: COZAAR ?Take 100 mg by mouth daily. ?  ?MIRALAX PO ?Take 17 g by mouth every other day. At night ?  ?Natural Vitamin D-3 125 MCG (5000 UT) Tabs ?Generic drug: Cholecalciferol ?Take 1 tablet (5,000 Units total) by mouth daily. ?  ?onetouch ultrasoft lancets ?1 each by Other route 6 (six) times daily. ?  ?OneTouch Verio test strip ?Generic drug: glucose blood ?1 each by Other route 6 (six) times daily. ?  ?oxyCODONE-acetaminophen 5-325 MG tablet ?Commonly known as: PERCOCET/ROXICET ?Take 1-2 tablets by mouth every 6 (six)  hours as needed for severe pain. ?  ?rosuvastatin 5 MG tablet ?Commonly known as: CRESTOR ?TAKE 1 TABLET(5 MG) BY MOUTH DAILY AT 6 PM ?  ?tadalafil 5 MG tablet ?Commonly known as: CIALIS ?Take 5 mg by mouth daily. ?  ?timolol 0.5 % ophthalmic solution ?Commonly known as: BETIMOL ?Place 1 drop into the left eye 2 (two) times daily. ?  ?VISINE OP ?Place 1-2 drops into both eyes 2 (two) times daily. ?  ? ?  ? ?  ?  ? ? ?  ?Discharge Care Instructions  ?(From admission, onward)  ?  ? ? ?  ? ?  Start     Ordered  ? 09/29/21 0000   Discharge wound care:       ?Comments: Okay to shower. Do not apply salves or appointments to incision. No heavy lifting with the upper extremities greater than 10 pounds. May resume driving when not requiring pain medication and patient feels comfortable with doing so.  ? 09/29/21 2032  ? ?  ?  ? ?  ? ? ? ?Signed: ?Earleen Newport ?09/29/2021, 8:32 PM ? ? ?

## 2021-09-30 ENCOUNTER — Encounter (HOSPITAL_COMMUNITY): Payer: Self-pay | Admitting: Neurological Surgery

## 2021-09-30 LAB — GLUCOSE, CAPILLARY
Glucose-Capillary: 194 mg/dL — ABNORMAL HIGH (ref 70–99)
Glucose-Capillary: 174 mg/dL — ABNORMAL HIGH (ref 70–99)

## 2021-09-30 NOTE — Discharge Instructions (Signed)

## 2021-09-30 NOTE — Evaluation (Signed)
Occupational Therapy Evaluation/Discharge ?Patient Details ?Name: Tom Baker ?MRN: [id number] ?DOB: May 24, 1956 ?Today's Date: 09/30/2021 ? ? ?History of Present Illness Pt is a 66 y/o male who presented for ACDF of C3-7 with ACP plate in setting of cervical spondylosis/myelopathy at same cervical levels. PMH: BPH, DM1, GERD, glaucoma, HTN, R femur fx (June 2022)  ? ?Clinical Impression ?  ?PTA, pt lives with spouse, typically Independent with daily tasks and works for [employer] system. Pt presents now with reported improvements in LE function, pain and UE sensation. Educated on cervical precaution for daily tasks including: techniques for ADLs, body mechanics/home setup for IADLs, heavier tasks that may require assist, and collar mgmt/wearing recommendations. Pt reports wife plans to go back to work Friday but feels confident he can manage necessary tasks while she is working. Anticipate no further skilled OT services needed at acute level or on DC.   ?   ? ?Recommendations for follow up therapy are one component of a multi-disciplinary discharge planning process, led by the attending physician.  Recommendations may be updated based on patient status, additional functional criteria and insurance authorization.  ? ?Follow Up Recommendations ? No OT follow up  ?  ?Assistance Recommended at Discharge PRN  ?Patient can return home with the following Assist for transportation;Assistance with cooking/housework ? ?  ?Functional Status Assessment ? Patient has had a recent decline in their functional status and demonstrates the ability to make significant improvements in function in a reasonable and predictable amount of time.  ?Equipment Recommendations ? None recommended by OT  ?  ?Recommendations for Other Services   ? ? ?  ?Precautions / Restrictions Precautions ?Precautions: Fall;Cervical ?Precaution Booklet Issued: Yes (comment) ?Required Braces or Orthoses: Cervical Brace ?Cervical Brace: Soft collar;For  comfort ?Restrictions ?Weight Bearing Restrictions: No  ? ?  ? ?Mobility Bed Mobility ?Overal bed mobility: Modified Independent ?  ?  ?  ?  ?  ?  ?General bed mobility comments: able to long sit and bring LEs off EOB without assist ?  ? ?Transfers ?Overall transfer level: Needs assistance ?Equipment used: None ?Transfers: Sit to/from Stand ?Sit to Stand: Min guard ?  ?  ?  ?  ?  ?General transfer comment: slow to rise with anterior lean, able to correct with increased tme ?  ? ?  ?Balance Overall balance assessment: Needs assistance ?Sitting-balance support: No upper extremity supported, Feet supported ?Sitting balance-Leahy Scale: Good ?  ?  ?Standing balance support: No upper extremity supported, During functional activity ?Standing balance-Leahy Scale: Fair ?  ?  ?  ?  ?  ?  ?  ?  ?  ?  ?  ?  ?   ? ?ADL either performed or assessed with clinical judgement  ? ?ADL Overall ADL's : Modified independent ?  ?  ?  ?  ?  ?  ?  ?  ?  ?  ?  ?  ?  ?  ?  ?  ?  ?  ?  ?General ADL Comments: Dressed self without assist this AM though difficulty with tennis shoes. educated on techniques for ADLs w/ cervical precautions in mind; emphasis on body mechanics with IADLs and wife assist as needed  ? ? ? ?Vision Baseline Vision/History: 1 Wears glasses ?Ability to See in Adequate Light: 0 Adequate ?Patient Visual Report: No change from baseline ?Vision Assessment?: No apparent visual deficits  ?   ?Perception   ?  ?Praxis   ?  ? ?Pertinent  Vitals/Pain Pain Assessment ?Pain Assessment: Faces ?Faces Pain Scale: Hurts a little bit ?Pain Location: neck ?Pain Descriptors / Indicators: Sore ?Pain Intervention(s): Monitored during session  ? ? ? ?Hand Dominance Left ?  ?Extremity/Trunk Assessment Upper Extremity Assessment ?Upper Extremity Assessment: RUE deficits/detail;LUE deficits/detail;[facility] for tasks assessed ?RUE Deficits / Details: midly diminished sensation in fingers though functional; mild soreness in shoulder/scapular  region; coordination intact ?LUE Deficits / Details: same as R UE ?  ?Lower Extremity Assessment ?Lower Extremity Assessment: Defer to PT evaluation ?  ?Cervical / Trunk Assessment ?Cervical / Trunk Assessment: Neck Surgery ?  ?Communication Communication ?Communication: No difficulties ?  ?Cognition Arousal/Alertness: Awake/alert ?Behavior During Therapy: [facility] for tasks assessed/performed ?Overall Cognitive Status: Within Functional Limits for tasks assessed ?  ?  ?  ?  ?  ?  ?  ?  ?  ?  ?  ?  ?  ?  ?  ?  ?  ?  ?  ?General Comments    ? ?  ?Exercises   ?  ?Shoulder Instructions    ? ? ?Home Living Family/patient expects to be discharged to:: Private residence ?Living Arrangements: Spouse/significant other ?Available Help at Discharge: Family;Available PRN/intermittently ?Type of Home: House ?Home Access: Stairs to enter ?Entrance Stairs-Number of Steps: 3 ?Entrance Stairs-Rails:  (has shower grab bar installed near steps) ?Home Layout: One level ?  ?  ?Bathroom Shower/Tub: Tub/shower unit;Curtain ?  ?Bathroom Toilet: Handicapped height ?Bathroom Accessibility: Yes ?  ?Home Equipment: Rolling Walker (2 wheels);Grab bars - tub/shower;Hand held shower head ?  ?Additional Comments: Wife works as a Social workerhair stylist, goes back to work Friday ?  ? ?  ?Prior Functioning/Environment Prior Level of Function : Independent/Modified Independent ?  ?  ?  ?  ?  ?  ?Mobility Comments: no use of AD; denies recent falls (aside from fall in June 2022 with femur fx); pt endorses balance has not been great recently ?ADLs Comments: Able to complete ADLs, IADLs; [place] [employer] [place] ?  ? ?  ?  ?OT Problem List:   ?  ?   ?OT Treatment/Interventions:    ?  ?OT Goals(Current goals can be found in the care plan section) Acute Rehab OT Goals ?Patient Stated Goal: go home today ?OT Goal Formulation: All assessment and education complete, DC therapy  ?OT Frequency:   ?  ? ?Co-evaluation   ?  ?  ?  ?  ? ?  ?AM-PAC OT "6 Clicks"  Daily Activity     ?Outcome Measure Help from another person eating meals?: None ?Help from another person taking care of personal grooming?: None ?Help from another person toileting, which includes using toliet, bedpan, or urinal?: None ?Help from another person bathing (including washing, rinsing, drying)?: None ?Help from another person to put on and taking off regular upper body clothing?: None ?Help from another person to put on and taking off regular lower body clothing?: None ?6 Click Score: 24 ?  ?End of Session Equipment Utilized During Treatment: Cervical collar ?Nurse Communication: Mobility status ? ?Activity Tolerance: Patient tolerated treatment well ?Patient left: in bed;with call bell/phone within reach ? ?OT Visit Diagnosis: Pain ?Pain - part of body:  (neck)  ?              ?Time: [id number] ?OT Time Calculation (min): 19 min ?Charges:  OT General Charges ?$OT Visit: 1 Visit ?OT Evaluation ?$OT Eval Low Complexity: 1 Low ? ?Bradd CanaryJulie B, OTR/L ?Acute Rehab Services ?Office:  [phone]  ? ?Lorre Munroe ?09/30/2021, 8:00 AM ?

## 2021-09-30 NOTE — Progress Notes (Signed)
Patient alert and oriented, mae's well, voiding adequate amount of urine, swallowing without difficulty, no c/o pain at time of discharge. Patient discharged home with family. Script and discharged instructions given to patient. Patient and family stated understanding of instructions given. Patient has an appointment with Dr. Elsner in 2 weeks ?

## 2021-09-30 NOTE — Evaluation (Signed)
Physical Therapy Evaluation ?Patient Details ?Name: Tom GougeDaniel A Flahive ?MRN: [id number] ?DOB: 10-01-1955 ?Today's Date: 09/30/2021 ? ?History of Present Illness ? Pt is a 66 y/o male who presented for ACDF of C3-7 with ACP plate in setting of cervical spondylosis/myelopathy at same cervical levels. PMH: BPH, DM1, GERD, glaucoma, HTN, R femur fx (June 2022).  ?Clinical Impression ? Pt presents with decreased functional mobility, balance, and gait secondary to diagnosis above. Functional transfers, gait, stair training, and education on continued mobility were performed. The patient tolerated well and accepted education. Pt ambulated 200 feet with no AD. He was able to reach dynamically out of BOS and return to upright without issue. He was educated on the use of a cane when walking outside or on uneven surfaces for improved stability. Pt. Responded well and has only had a mild decrease in functional ability. SPT recommends no PT follow up once medically stable for d/c and expects quick return to baseline. PT to sign off given supervision level for mobility. Please read consult if there is any change to functional mobility.    ? ?   ?   ? ?Recommendations for follow up therapy are one component of a multi-disciplinary discharge planning process, led by the attending physician.  Recommendations may be updated based on patient status, additional functional criteria and insurance authorization. ? ?Follow Up Recommendations No PT follow up ? ?  ?Assistance Recommended at Discharge Set up Supervision/Assistance  ?Patient can return home with the following ? Assist for transportation;A little help with walking and/or transfers;A little help with bathing/dressing/bathroom;Assistance with cooking/housework;Help with stairs or ramp for entrance ? ?  ?Equipment Recommendations None recommended by PT  ?Recommendations for Other Services ?    ?  ?Functional Status Assessment Patient has had a recent decline in their functional status and  demonstrates the ability to make significant improvements in function in a reasonable and predictable amount of time.  ? ?  ?Precautions / Restrictions Precautions ?Precautions: Fall;Cervical ?Precaution Booklet Issued: Yes (comment) ?Precaution Comments: Soft Collar ?Required Braces or Orthoses: Cervical Brace ?Cervical Brace: Soft collar;For comfort ?Restrictions ?Weight Bearing Restrictions: No  ? ?  ? ?Mobility ? Bed Mobility ?Overal bed mobility: Modified Independent ?  ?  ?  ?  ?  ?  ?General bed mobility comments: Able to long sit and pivot to bring LE off bed ?  ? ?Transfers ?Overall transfer level: Needs assistance ?Equipment used: None ?Transfers: Sit to/from Stand ?Sit to Stand: Supervision ?  ?  ?  ?  ?  ?General transfer comment: Required Minimal assist to donn shoes ?  ? ?Ambulation/Gait ?Ambulation/Gait assistance: Supervision ?Gait Distance (Feet): 200 Feet ?Assistive device: None ?Gait Pattern/deviations: Decreased step length - right, Decreased step length - left, Staggering left, Staggering right, Steppage ?Gait velocity: decreased ?  ?  ?General Gait Details: Walks with a general LE extension and limited ankle and knee movement. Pt. reports improvement in sesnation since surgery. ? ?Stairs ?Stairs: Yes ?Stairs assistance: Min guard ?Stair Management: One rail Right, Step to pattern ?Number of Stairs: 3 ?  ? ?Wheelchair Mobility ?  ? ?Modified Rankin (Stroke Patients Only) ?  ? ?  ? ?Balance Overall balance assessment: Needs assistance ?Sitting-balance support: No upper extremity supported, Feet supported ?Sitting balance-Leahy Scale: Good ?  ?  ?Standing balance support: No upper extremity supported, During functional activity ?Standing balance-Leahy Scale: Fair ?Standing balance comment: Reccomended to use a cane when walking on uneven surfaces ?  ?  ?  ?  ?  ?  ?  ?  ?  ?  ?  ?   ? ? ? ?  Pertinent Vitals/Pain Pain Assessment ?Pain Assessment: No/denies pain ?Faces Pain Scale: Hurts a little  bit ?Pain Location: neck ?Pain Descriptors / Indicators: Sore, Guarding ?Pain Intervention(s): Limited activity within patient's tolerance, Monitored during session  ? ? ?Home Living Family/patient expects to be discharged to:: Private residence ?Living Arrangements: Spouse/significant other ?Available Help at Discharge: Family;Available PRN/intermittently ?Type of Home: House ?Home Access: Stairs to enter ?Entrance Stairs-Rails:  (has shower grab bar installed near steps) ?Entrance Stairs-Number of Steps: 3 ?  ?Home Layout: One level ?Home Equipment: Rolling Walker (2 wheels);Grab bars - tub/shower;Hand held shower head ?Additional Comments: Wife works as a Social worker, goes back to work Friday  ?  ?Prior Function Prior Level of Function : Independent/Modified Independent ?  ?  ?  ?  ?  ?  ?Mobility Comments: no use of AD; denies recent falls (aside from fall in June 2022 with femur fx); pt endorses balance has not been great recently ?ADLs Comments: Able to complete ADLs, IADLs; works in [employer] school system ?  ? ? ?Hand Dominance  ? Dominant Hand: Left ? ?  ?Extremity/Trunk Assessment  ? Upper Extremity Assessment ?Upper Extremity Assessment: Defer to OT evaluation ?  ? ?Lower Extremity Assessment ?Lower Extremity Assessment: RLE deficits/detail;LLE deficits/detail ?RLE Sensation: history of peripheral neuropathy ?LLE Sensation: history of peripheral neuropathy ?  ? ?Cervical / Trunk Assessment ?Cervical / Trunk Assessment: Neck Surgery  ?Communication  ? Communication: No difficulties  ?Cognition Arousal/Alertness: Awake/alert ?Behavior During Therapy: [facility] for tasks assessed/performed ?Overall Cognitive Status: Within Functional Limits for tasks assessed ?  ?  ?  ?  ?  ?  ?  ?  ?  ?  ?  ?  ?  ?  ?  ?  ?  ?  ?  ? ?  ?General Comments General comments (skin integrity, edema, etc.): Educated pt on decreased visual scanning secondary to cervical collar. ? ?  ?Exercises    ? ?Assessment/Plan  ?  ?PT  Assessment Patient does not need any further PT services  ?PT Problem List   ? ?   ?  ?PT Treatment Interventions     ? ?PT Goals (Current goals can be found in the Care Plan section)  ?Acute Rehab PT Goals ?Patient Stated Goal: To return home and to work. ?PT Goal Formulation: With patient ?Time For Goal Achievement: 09/30/21 ?Potential to Achieve Goals: Good ? ?  ?Frequency   ?  ? ? ?Co-evaluation   ?  ?  ?  ?  ? ? ?  ?AM-PAC PT "6 Clicks" Mobility  ?Outcome Measure Help needed turning from your back to your side while in a flat bed without using bedrails?: None ?Help needed moving from lying on your back to sitting on the side of a flat bed without using bedrails?: None ?Help needed moving to and from a bed to a chair (including a wheelchair)?: A Little ?Help needed standing up from a chair using your arms (e.g., wheelchair or bedside chair)?: A Little ?Help needed to walk in hospital room?: A Little ?Help needed climbing 3-5 steps with a railing? : A Little ?6 Click Score: 20 ? ?  ?End of Session Equipment Utilized During Treatment: Cervical collar ?Activity Tolerance: Patient tolerated treatment well ?Patient left: Other (comment) (Left standing in room) ?Nurse Communication: Mobility status ?PT Visit Diagnosis: Other abnormalities of gait and mobility (R26.89);Muscle weakness (generalized) (M62.81) ?  ? ?Time: 2831-5176 ?PT Time Calculation (min) (ACUTE ONLY): 18 min ? ? ?Charges:  PT Evaluation ?$PT Eval Low Complexity: 1 Low ?  ?  ?   ? ? ?Lorie Apley, SPT ?Acute Rehab Services ? ? ?Lorie Apley ?09/30/2021, 11:31 AM ? ?

## 2022-02-15 ENCOUNTER — Encounter (HOSPITAL_BASED_OUTPATIENT_CLINIC_OR_DEPARTMENT_OTHER): Payer: Self-pay | Admitting: Emergency Medicine

## 2022-02-15 ENCOUNTER — Other Ambulatory Visit: Payer: Self-pay

## 2022-02-15 DIAGNOSIS — N410 Acute prostatitis: Secondary | ICD-10-CM | POA: Diagnosis not present

## 2022-02-15 DIAGNOSIS — Z20822 Contact with and (suspected) exposure to covid-19: Secondary | ICD-10-CM | POA: Diagnosis not present

## 2022-02-15 DIAGNOSIS — E119 Type 2 diabetes mellitus without complications: Secondary | ICD-10-CM | POA: Insufficient documentation

## 2022-02-15 DIAGNOSIS — Z794 Long term (current) use of insulin: Secondary | ICD-10-CM | POA: Diagnosis not present

## 2022-02-15 DIAGNOSIS — Z79899 Other long term (current) drug therapy: Secondary | ICD-10-CM | POA: Diagnosis not present

## 2022-02-15 DIAGNOSIS — R509 Fever, unspecified: Secondary | ICD-10-CM | POA: Insufficient documentation

## 2022-02-15 DIAGNOSIS — Z7982 Long term (current) use of aspirin: Secondary | ICD-10-CM | POA: Insufficient documentation

## 2022-02-15 LAB — CBC WITH DIFFERENTIAL/PLATELET
Abs Immature Granulocytes: 0.04 K/uL (ref 0.00–0.07)
Basophils Absolute: 0 K/uL (ref 0.0–0.1)
Basophils Relative: 0 %
Eosinophils Absolute: 0 K/uL (ref 0.0–0.5)
Eosinophils Relative: 0 %
HCT: 39.7 % (ref 39.0–52.0)
Hemoglobin: 13.6 g/dL (ref 13.0–17.0)
Immature Granulocytes: 0 %
Lymphocytes Relative: 5 %
Lymphs Abs: 0.6 K/uL — ABNORMAL LOW (ref 0.7–4.0)
MCH: 28.6 pg (ref 26.0–34.0)
MCHC: 34.3 g/dL (ref 30.0–36.0)
MCV: 83.6 fL (ref 80.0–100.0)
Monocytes Absolute: 0.8 K/uL (ref 0.1–1.0)
Monocytes Relative: 7 %
Neutro Abs: 11.1 K/uL — ABNORMAL HIGH (ref 1.7–7.7)
Neutrophils Relative %: 88 %
Platelets: 227 K/uL (ref 150–400)
RBC: 4.75 MIL/uL (ref 4.22–5.81)
RDW: 14.3 % (ref 11.5–15.5)
WBC: 12.6 K/uL — ABNORMAL HIGH (ref 4.0–10.5)
nRBC: 0 % (ref 0.0–0.2)

## 2022-02-15 LAB — URINALYSIS, ROUTINE W REFLEX MICROSCOPIC
Bilirubin Urine: NEGATIVE
Glucose, UA: 100 mg/dL — AB
Ketones, ur: NEGATIVE mg/dL
Nitrite: POSITIVE — AB
Protein, ur: NEGATIVE mg/dL
Specific Gravity, Urine: 1.015 (ref 1.005–1.030)
pH: 5 (ref 5.0–8.0)

## 2022-02-15 LAB — COMPREHENSIVE METABOLIC PANEL WITH GFR
ALT: 36 U/L (ref 0–44)
AST: 40 U/L (ref 15–41)
Albumin: 4.3 g/dL (ref 3.5–5.0)
Alkaline Phosphatase: 60 U/L (ref 38–126)
Anion gap: 10 (ref 5–15)
BUN: 31 mg/dL — ABNORMAL HIGH (ref 8–23)
CO2: 22 mmol/L (ref 22–32)
Calcium: 8.9 mg/dL (ref 8.9–10.3)
Chloride: 103 mmol/L (ref 98–111)
Creatinine, Ser: 1.6 mg/dL — ABNORMAL HIGH (ref 0.61–1.24)
GFR, Estimated: 47 mL/min — ABNORMAL LOW (ref >60)
Glucose, Bld: 216 mg/dL — ABNORMAL HIGH (ref 70–99)
Potassium: 4.1 mmol/L (ref 3.5–5.1)
Sodium: 135 mmol/L (ref 135–145)
Total Bilirubin: 2 mg/dL — ABNORMAL HIGH (ref 0.3–1.2)
Total Protein: 7.8 g/dL (ref 6.5–8.1)

## 2022-02-15 LAB — URINALYSIS, MICROSCOPIC (REFLEX)

## 2022-02-15 LAB — CBG MONITORING, ED: Glucose-Capillary: 182 mg/dL — ABNORMAL HIGH (ref 70–99)

## 2022-02-15 LAB — SARS CORONAVIRUS 2 BY RT PCR: SARS Coronavirus 2 by RT PCR: NEGATIVE

## 2022-02-15 NOTE — ED Triage Notes (Signed)
Patient reports he has had chills throughout the day today. Fever was noted at home and his blood sugar was running high.

## 2022-02-16 ENCOUNTER — Emergency Department (HOSPITAL_BASED_OUTPATIENT_CLINIC_OR_DEPARTMENT_OTHER)
Admission: EM | Admit: 2022-02-16 | Discharge: 2022-02-16 | Disposition: A | Discharge status: Home / Self Care | Payer: BC Managed Care – PPO | Attending: Emergency Medicine | Admitting: Emergency Medicine

## 2022-02-16 DIAGNOSIS — N410 Acute prostatitis: Secondary | ICD-10-CM

## 2022-02-16 DIAGNOSIS — R509 Fever, unspecified: Secondary | ICD-10-CM

## 2022-02-16 MED ORDER — LEVOFLOXACIN 750 MG PO TABS: 750.0000 mg | ORAL_TABLET | Freq: Once | ORAL | Status: DC

## 2022-02-16 MED ORDER — LEVOFLOXACIN 250 MG PO TABS: 250.0000 mg | ORAL_TABLET | Freq: Every day | ORAL | 0 refills | Status: AC

## 2022-02-16 MED ORDER — ACETAMINOPHEN 325 MG PO TABS
650.0000 mg | ORAL_TABLET | Freq: Once | ORAL | Status: AC
  Administered 2022-02-16: 650 mg via ORAL
  Filled 2022-02-16: qty 2

## 2022-02-16 MED ORDER — LEVOFLOXACIN 500 MG PO TABS
500.0000 mg | ORAL_TABLET | Freq: Once | ORAL | Status: AC
  Administered 2022-02-16: 500 mg via ORAL
  Filled 2022-02-16: qty 1

## 2022-02-16 NOTE — ED Notes (Signed)
ED Provider at bedside. 

## 2022-02-16 NOTE — ED Provider Notes (Signed)
[facility] EMERGENCY DEPARTMENT  Provider Note  CSN: [id number] Arrival date & time: 02/15/22 2215  History Chief Complaint  Patient presents with   Fever    Tom Baker is a 66 y.o. male with history of BPH followed by urology and DM with insulin pump was in his usual state of health until he was at work today and began having chills. He ws feeling poorly so he went home where his wife noted him to be febrile. He had an elevated glucose then too. He has had a mild dry cough recently, but denies any other symptoms including CP, SOB, sore throat, N/V/D or dysuria.    Home Medications Prior to Admission medications   Medication Sig Start Date End Date Taking? Authorizing Provider  levofloxacin (LEVAQUIN) 250 MG tablet Take 1 tablet (250 mg total) by mouth daily for 14 days. 02/16/22 03/02/22 Yes Pollyann Savoy, MD  amLODipine (NORVASC) 5 MG tablet Take 1 tablet (5 mg total) by mouth daily. 12/17/15   Alison Murray, MD  aspirin EC 81 MG tablet Take 81 mg by mouth at bedtime.    [provider]  calcium carbonate (TUMS - DOSED IN MG ELEMENTAL CALCIUM) 500 MG chewable tablet Chew 2 tablets by mouth daily as needed for indigestion or heartburn.    [provider]  Cholecalciferol 125 MCG (5000 UT) TABS Take 1 tablet (5,000 Units total) by mouth daily. 12/18/20   Montez Morita, PA-C  diazepam (VALIUM) 5 MG tablet Take 1 tablet (5 mg total) by mouth every 6 (six) hours as needed for muscle spasms. 09/29/21 09/29/22  Barnett Abu, MD  insulin aspart (NOVOLOG) 100 UNIT/ML injection Inject into the skin continuous. Use in insulin pump    [provider]  Insulin Infusion Pump DEVI 50 Units by Does not apply route continuous. Novolog Insulin    [provider]  Lancets (ONETOUCH ULTRASOFT) lancets 1 each by Other route 6 (six) times daily. 10/10/15   [provider]  levothyroxine (SYNTHROID, LEVOTHROID) 125 MCG tablet Take 125 mcg by mouth daily.     [provider]  losartan (COZAAR) 100 MG tablet Take 100 mg by mouth daily.    [provider]  [facility] VERIO test strip 1 each by Other route 6 (six) times daily. 10/14/20   [provider]  oxyCODONE-acetaminophen (PERCOCET/ROXICET) 5-325 MG tablet Take 1-2 tablets by mouth every 6 (six) hours as needed for severe pain. 09/29/21   Barnett Abu, MD  Polyethylene Glycol 3350 (MIRALAX PO) Take 17 g by mouth every other day. At night    [provider]  rosuvastatin (CRESTOR) 5 MG tablet TAKE 1 TABLET(5 MG) BY MOUTH DAILY AT 6 PM 05/14/21   Chilton Si, MD  tadalafil (CIALIS) 5 MG tablet Take 5 mg by mouth daily.    [provider]  Tetrahydrozoline HCl (VISINE OP) Place 1-2 drops into both eyes 2 (two) times daily.    [provider]  timolol (BETIMOL) 0.5 % ophthalmic solution Place 1 drop into the left eye 2 (two) times daily.    [provider]     Allergies    Patient has no known allergies.   Review of Systems   Review of Systems Please see HPI for pertinent positives and negatives  Physical Exam BP 137/78   Pulse (!) 104   Temp 100.2 F (37.9 C) (Oral)   Resp 18   Ht 6\' 3"  (1.905 m)   Wt 88.5  kg   SpO2 100%   BMI 24.37 kg/m   Physical Exam Vitals and nursing note reviewed.  Constitutional:      Appearance: Normal appearance.  HENT:     Head: Normocephalic and atraumatic.     Nose: Nose normal.     Mouth/Throat:     Mouth: Mucous membranes are moist.  Eyes:     Extraocular Movements: Extraocular movements intact.     Conjunctiva/sclera: Conjunctivae normal.  Cardiovascular:     Rate and Rhythm: Normal rate.  Pulmonary:     Effort: Pulmonary effort is normal.     Breath sounds: Normal breath sounds.  Abdominal:     General: Abdomen is flat.     Palpations: Abdomen is soft.     Tenderness: There is no abdominal tenderness.  Musculoskeletal:        General: No swelling. Normal range of  motion.     Cervical back: Neck supple.  Skin:    General: Skin is warm and dry.  Neurological:     General: No focal deficit present.     Mental Status: He is alert.  Psychiatric:        Mood and Affect: Mood normal.     ED Results / Procedures / Treatments   EKG None  Procedures Procedures  Medications Ordered in the ED Medications  levofloxacin (LEVAQUIN) tablet 500 mg (has no administration in time range)    Initial Impression and Plan  Patient here with fever, no other significant symptoms. He has had a mild cough. Labs done in triage show CBC with mild leukocytosis. CMP with mildly elevated Cr, consistent with previous. UA with Nitrites and bacteria. Suspect this is an early prostatitis. He has declined CXR given his insulin pump connector would have to be thrown out. Will send for urine culture, begin Levaquin with renal dosing for GFR 47 (which should also cover respiratory infection if present) and recommend outpatient PCP and Urology follow up. RTED if not improving.   ED Course       MDM Rules/Calculators/A&P Medical Decision Making Given presenting complaint, I considered that admission might be necessary. After review of results from ED lab and/or imaging studies, admission to the hospital is not indicated at this time.    Problems Addressed: Acute prostatitis: acute illness or injury Fever, unspecified fever cause: acute illness or injury  Amount and/or Complexity of Data Reviewed Labs: ordered. Decision-making details documented in ED Course.  Risk Prescription drug management. Decision regarding hospitalization.    Final Clinical Impression(s) / ED Diagnoses Final diagnoses:  Fever, unspecified fever cause  Acute prostatitis    Rx / DC Orders ED Discharge Orders          Ordered    levofloxacin (LEVAQUIN) 250 MG tablet  Daily        02/16/22 0210             Pollyann Savoy, MD 02/16/22 0210

## 2022-02-17 LAB — URINE CULTURE: Culture: <10000 — AB

## 2022-02-23 ENCOUNTER — Other Ambulatory Visit: Payer: Self-pay | Admitting: Family Medicine

## 2022-02-23 DIAGNOSIS — R1909 Other intra-abdominal and pelvic swelling, mass and lump: Secondary | ICD-10-CM

## 2022-03-26 ENCOUNTER — Ambulatory Visit
Admission: RE | Admit: 2022-03-26 | Discharge: 2022-03-26 | Disposition: A | Discharge status: Home / Self Care | Payer: BC Managed Care – PPO | Source: Ambulatory Visit | Attending: Family Medicine | Admitting: Family Medicine

## 2022-03-26 DIAGNOSIS — R1909 Other intra-abdominal and pelvic swelling, mass and lump: Secondary | ICD-10-CM

## 2022-03-26 MED ORDER — IOPAMIDOL (ISOVUE-300) INJECTION 61%
75.0000 mL | Freq: Once | INTRAVENOUS | Status: AC | PRN
  Administered 2022-03-26: 75 mL via INTRAVENOUS

## 2022-07-02 ENCOUNTER — Other Ambulatory Visit: Payer: Self-pay | Admitting: Cardiovascular Disease

## 2022-07-29 ENCOUNTER — Other Ambulatory Visit: Payer: Self-pay | Admitting: Cardiovascular Disease

## 2022-07-29 NOTE — Telephone Encounter (Signed)
Rx(s) sent to pharmacy electronically.  

## 2022-08-11 ENCOUNTER — Other Ambulatory Visit: Payer: Self-pay | Admitting: Cardiovascular Disease

## 2022-08-26 ENCOUNTER — Other Ambulatory Visit: Payer: Self-pay | Admitting: Cardiovascular Disease

## 2022-08-27 NOTE — Telephone Encounter (Signed)
Please call pt to schedule overdue 1 year follow-up appointment with Dr. Oval Linsey or APP for refills. Last OV 01/2021. Thank you!

## 2022-08-30 ENCOUNTER — Telehealth: Payer: Self-pay | Admitting: Cardiovascular Disease

## 2022-08-30 MED ORDER — ROSUVASTATIN CALCIUM 5 MG PO TABS: 5.0000 mg | ORAL_TABLET | Freq: Every day | ORAL | 1 refills | Status: DC

## 2022-08-30 NOTE — Telephone Encounter (Signed)
Rx request sent to pharmacy.  

## 2022-08-30 NOTE — Telephone Encounter (Signed)
Scheduled 03/08/23 with Dr. Oval Linsey

## 2022-08-30 NOTE — Telephone Encounter (Signed)
*  STAT* If patient is at the pharmacy, call can be transferred to refill team.   1. Which medications need to be refilled? (please list name of each medication and dose if known)   rosuvastatin (CRESTOR) 5 MG tablet    2. Which pharmacy/location (including street and city if local pharmacy) is medication to be sent to?   [place], [address] AT [place]    3. Do they need a 30 day or 90 day supply? 90   Pt made an appt for 03/08/23 with Dr. Oval Linsey. I asked pt if he wanted to see APP but he would rather see MD.

## 2022-09-29 ENCOUNTER — Ambulatory Visit: Payer: Self-pay | Admitting: General Surgery

## 2022-10-28 ENCOUNTER — Encounter (HOSPITAL_BASED_OUTPATIENT_CLINIC_OR_DEPARTMENT_OTHER): Payer: Self-pay

## 2022-10-28 ENCOUNTER — Ambulatory Visit (HOSPITAL_BASED_OUTPATIENT_CLINIC_OR_DEPARTMENT_OTHER): Admit: 2022-10-28 | Payer: BC Managed Care – PPO | Admitting: General Surgery

## 2022-10-28 SURGERY — REPAIR, HERNIA, INGUINAL, ADULT
Anesthesia: General | Laterality: Left

## 2023-01-14 DIAGNOSIS — Z125 Encounter for screening for malignant neoplasm of prostate: Secondary | ICD-10-CM | POA: Diagnosis not present

## 2023-01-14 DIAGNOSIS — E039 Hypothyroidism, unspecified: Secondary | ICD-10-CM | POA: Diagnosis not present

## 2023-01-14 DIAGNOSIS — E1059 Type 1 diabetes mellitus with other circulatory complications: Secondary | ICD-10-CM | POA: Diagnosis not present

## 2023-01-14 DIAGNOSIS — E1040 Type 1 diabetes mellitus with diabetic neuropathy, unspecified: Secondary | ICD-10-CM | POA: Diagnosis not present

## 2023-01-14 DIAGNOSIS — E559 Vitamin D deficiency, unspecified: Secondary | ICD-10-CM | POA: Diagnosis not present

## 2023-01-14 DIAGNOSIS — E7800 Pure hypercholesterolemia, unspecified: Secondary | ICD-10-CM | POA: Diagnosis not present

## 2023-01-14 DIAGNOSIS — Z9641 Presence of insulin pump (external) (internal): Secondary | ICD-10-CM | POA: Diagnosis not present

## 2023-01-15 LAB — LAB REPORT - SCANNED: EGFR: 47

## 2023-01-21 DIAGNOSIS — N1831 Chronic kidney disease, stage 3a: Secondary | ICD-10-CM | POA: Diagnosis not present

## 2023-01-21 DIAGNOSIS — I10 Essential (primary) hypertension: Secondary | ICD-10-CM | POA: Diagnosis not present

## 2023-01-21 DIAGNOSIS — I483 Typical atrial flutter: Secondary | ICD-10-CM | POA: Diagnosis not present

## 2023-01-21 DIAGNOSIS — E559 Vitamin D deficiency, unspecified: Secondary | ICD-10-CM | POA: Diagnosis not present

## 2023-01-21 DIAGNOSIS — Z0000 Encounter for general adult medical examination without abnormal findings: Secondary | ICD-10-CM | POA: Diagnosis not present

## 2023-01-21 DIAGNOSIS — E1059 Type 1 diabetes mellitus with other circulatory complications: Secondary | ICD-10-CM | POA: Diagnosis not present

## 2023-01-27 DIAGNOSIS — I10 Essential (primary) hypertension: Secondary | ICD-10-CM | POA: Diagnosis not present

## 2023-01-27 DIAGNOSIS — E109 Type 1 diabetes mellitus without complications: Secondary | ICD-10-CM | POA: Diagnosis not present

## 2023-01-27 DIAGNOSIS — Z794 Long term (current) use of insulin: Secondary | ICD-10-CM | POA: Diagnosis not present

## 2023-01-27 DIAGNOSIS — E1065 Type 1 diabetes mellitus with hyperglycemia: Secondary | ICD-10-CM | POA: Diagnosis not present

## 2023-01-27 DIAGNOSIS — E108 Type 1 diabetes mellitus with unspecified complications: Secondary | ICD-10-CM | POA: Diagnosis not present

## 2023-02-19 ENCOUNTER — Other Ambulatory Visit: Payer: Self-pay | Admitting: Cardiovascular Disease

## 2023-02-21 NOTE — Telephone Encounter (Signed)
Rx(s) sent to pharmacy electronically.  

## 2023-03-01 ENCOUNTER — Telehealth: Payer: Self-pay | Admitting: Cardiovascular Disease

## 2023-03-01 MED ORDER — ROSUVASTATIN CALCIUM 5 MG PO TABS: 5.0000 mg | ORAL_TABLET | Freq: Every day | ORAL | 0 refills | Status: DC

## 2023-03-01 NOTE — Telephone Encounter (Signed)
Rx request sent to pharmacy.  

## 2023-03-01 NOTE — Telephone Encounter (Signed)
*  STAT* If patient is at the pharmacy, call can be transferred to refill team.   1. Which medications need to be refilled? (please list name of each medication and dose if known) rosuvastatin (CRESTOR) 5 MG tablet   2. Would you like to learn more about the convenience, safety, & potential cost savings by using the [facility]?No   3. Are you open to using the [facility] No   4. Which pharmacy/location (including street and city if local pharmacy) is medication to be sent to?[id number] - [place], [place] - [address] AT [address]   5. Do they need a 30 day or 90 day supply? 90 Day Supply  Pt is currently out. Pt is scheduled to see Dr. Duke Salvia on 09/03

## 2023-03-08 ENCOUNTER — Encounter (HOSPITAL_BASED_OUTPATIENT_CLINIC_OR_DEPARTMENT_OTHER): Payer: Self-pay | Admitting: Cardiovascular Disease

## 2023-03-08 ENCOUNTER — Ambulatory Visit (INDEPENDENT_AMBULATORY_CARE_PROVIDER_SITE_OTHER): Payer: Medicare HMO | Admitting: Cardiovascular Disease

## 2023-03-08 VITALS — BP 122/60 | HR 84 | Ht 75.0 in | Wt 196.0 lb

## 2023-03-08 DIAGNOSIS — I10 Essential (primary) hypertension: Secondary | ICD-10-CM

## 2023-03-08 DIAGNOSIS — E785 Hyperlipidemia, unspecified: Secondary | ICD-10-CM | POA: Diagnosis not present

## 2023-03-08 DIAGNOSIS — I483 Typical atrial flutter: Secondary | ICD-10-CM | POA: Diagnosis not present

## 2023-03-08 NOTE — Progress Notes (Deleted)
  Cardiology Office Note:  .   Date:  03/08/2023  ID:  Tom Baker, DOB 10/21/1955, MRN [id number] PCP: Irena Reichmann, DO  [facility] Providers Cardiologist:  Chilton Si, MD { Click to update primary MD,subspecialty MD or APP then REFRESH:1}   History of Present Illness: .   Tom Baker is a 67 y.o. male with diabetes mellitus type 1, PAF,  hypertension, and hyperlipidemia who presents for follow up.  Tom Baker was initially seen 11/2015 with lower extremity edema. He was referred for [facility] on 12/2015 that was negative for ischemia.  He was admitted to the hospital 6/9-6/13 with cough-variant syncope in the setting of pneumonia.  He was noted to have a moderate pericardial effusion and was started on colchicine and aspirin.  He denied chest pain at that time.  He was also noted to have atrial flutter and was treated with aspirin only due to concern of hemorrhagic conversion of his pericardial effusion.  Tom Baker followed up in clinic and his repeat echo showed resolution of the effusion. He was started on Xarelto at that time.  Tom Baker denies chest pain, shortness of breath, lower extremity edema, orthopnea or PND.  He also denies any recurrent palpitations since discharge.  He was referred for [facility] 12/05/15 that was negative for ischemia.     He was admitted 12/2020 with a hip fracture that occurred after a mechanical fall.  He reports his fall occurred as he was chasing their dog. During the surgery he was not in Afib.   ROS: ***  Studies Reviewed: .        *** Risk Assessment/Calculations:   {Does this patient have ATRIAL FIBRILLATION?:[phone]} No BP recorded.  {Refresh Note OR Click here to enter BP  :1}***       Physical Exam:   VS:  There were no vitals taken for this visit.   Wt Readings from Last 3 Encounters:  02/15/22 195 lb (88.5 kg)  09/29/21 192 lb 11.2 oz (87.4 kg)  09/17/21 192 lb 11.2 oz (87.4 kg)    GEN: Well nourished, well  developed in no acute distress NECK: No JVD; No carotid bruits CARDIAC: ***RRR, no murmurs, rubs, gallops RESPIRATORY:  Clear to auscultation without rales, wheezing or rhonchi  ABDOMEN: Soft, non-tender, non-distended EXTREMITIES:  No edema; No deformity   ASSESSMENT AND PLAN: .   ***    {Are you ordering a CV Procedure (e.g. stress test, cath, DCCV, TEE, etc)?   Press F2        :[id number]}  Dispo: ***  Signed, Chilton Si, MD

## 2023-03-08 NOTE — Progress Notes (Signed)
Cardiology Office Note:  .    Date:  03/08/2023  ID:  Conard Novak, DOB 1956-07-02, MRN [id number] PCP: Irena Reichmann, DO  [facility] Providers Cardiologist:  Chilton Si, MD     History of Present Illness: .    Tom PEERSON is a 67 y.o. male with diabetes mellitus type 1, hypertension, and hyperlipidemia, who presents for follow up.  Mr. Felmlee was initially seen 11/2015 with lower extremity edema. He was referred for [facility] on 12/2015 that was negative for ischemia.  He was admitted to the hospital 6/9-6/13 with cough-variant syncope in the setting of pneumonia.  He was noted to have a moderate pericardial effusion and was started on colchicine and aspirin.  He denied chest pain at that time.  He was also noted to have atrial flutter and was treated with aspirin only due to concern of hemorrhagic conversion of his pericardial effusion.  Mr. Shahan followed up in clinic and his repeat echo showed resolution of the effusion. He was started on Xarelto at that time.  Mr. Porras denies chest pain, shortness of breath, lower extremity edema, orthopnea or PND.  He also denies any recurrent palpitations since discharge.  He was referred for [facility] 12/05/15 that was negative for ischemia.     He was admitted 12/2020 with a hip fracture that occurred after a mechanical fall.  He reports his fall occurred as he was chasing their dog and suffered an injury while performing a pivoting turn. During the surgery he was not in Afib. On 09/29/2021 he underwent anterior cervical decompression C3-7. In 02/2022 he presented to the ER with fever and was treated for acute prostatitis.    Today, he states he is feeling pretty good. He has been retired for about 3 months. He admits to not exercising as much as he should, although he is walking at least every other day. Usually he does walk his dog outside. No anginal symptoms or shortness of breath. He plans to set a daily goal for achieving a certain  amount of steps. Since his spinal surgery he noted initial improvement, but still has some balance issues so he is careful when walking. At times he may develop mild swelling in the bottom of his right foot. Does note the presence of hardware in his right ankle. He does wear compression socks. In the office today his blood pressure is 122/60. He confirms stable blood pressures at home. He denies any palpitations, chest pain, lightheadedness, headaches, syncope, orthopnea, or PND.  ROS:  Please see the history of present illness. All other systems are reviewed and negative.  (+) Intermittent LE edema to the right foot  Studies Reviewed: .       03/08/23 EKG: Sinus rhythm.  Rate 84 bpm.  First degree AV block.   Risk Assessment/Calculations:             Physical Exam:    VS:  BP 122/60   Pulse 84   Ht 6\' 3"  (1.905 m)   Wt 196 lb (88.9 kg)   BMI 24.50 kg/m  , BMI Body mass index is 24.5 kg/m. GENERAL:  Well appearing HEENT: Pupils equal round and reactive, fundi not visualized, oral mucosa unremarkable NECK:  No jugular venous distention, waveform within normal limits, carotid upstroke brisk and symmetric, no bruits, no thyromegaly LUNGS:  Clear to auscultation bilaterally HEART:  RRR.  PMI not displaced or sustained,S1 and S2 within normal limits, no S3, no S4, no clicks,  no rubs, no murmurs ABD:  Flat, positive bowel sounds normal in frequency in pitch, no bruits, no rebound, no guarding, no midline pulsatile mass, no hepatomegaly, no splenomegaly EXT:  2 plus pulses throughout, no edema, no cyanosis no clubbing.  Prominent thenar atrophy and digit contractions SKIN:  No rashes no nodules NEURO:  Cranial nerves II through XII grossly intact, motor grossly intact throughout PSYCH:  Cognitively intact, oriented to person place and time  Wt Readings from Last 3 Encounters:  03/08/23 196 lb (88.9 kg)  02/15/22 195 lb (88.5 kg)  09/29/21 192 lb 11.2 oz (87.4 kg)     ASSESSMENT AND  PLAN: .       #Hyperlipidemia LDL slightly above goal (80s). Currently on Rosuvastatin 5mg . No muscle aches reported. Discussed the importance of exercise and potential need to increase Rosuvastatin dose if LDL remains above 70.  Given DM type 1, LDL goal is <70. -Encourage increased exercise, specifically walking, with a set schedule for consistency. -Recheck lipid panel and comprehensive metabolic panel in December 2024. -Consider increasing Rosuvastatin to 10mg  if LDL remains above 70.  # Chronic Kidney Disease 3a: Creatinine levels fluctuating between 1.3 and 1.6 over the past two years. No proteinuria. Patient concerned about kidney function. -Reassure patient that kidney function has been stable and that current medications (Losartan) are beneficial for kidney health. -Continue monitoring kidney function with regular lab work.  # Hypertension Well-controlled on current regimen of Amlodipine and Losartan. -Continue current medications.             Dispo:  FU with Adaleen Hulgan C. Duke Salvia, MD, [facility] in 1 year.  I,Mathew Stumpf,acting as a Neurosurgeon for Chilton Si, MD.,have documented all relevant documentation on the behalf of Chilton Si, MD,as directed by  Chilton Si, MD while in the presence of Chilton Si, MD.  I, Ranita Stjulien C. Duke Salvia, MD have reviewed all documentation for this visit.  The documentation of the exam, diagnosis, procedures, and orders on 03/08/2023 are all accurate and complete.   Signed, Chilton Si, MD

## 2023-03-08 NOTE — Patient Instructions (Signed)
Medication Instructions:  Your physician recommends that you continue on your current medications as directed. Please refer to the Current Medication list given to you today.  *If you need a refill on your cardiac medications before your next appointment, please call your pharmacy*   Lab Work: Your physician recommends that you return for lab work in December- Fasting Lipid Panel & CMP   Follow-Up: At [facility], you and your health needs are our priority.  As part of our continuing mission to provide you with exceptional heart care, we have created designated Provider Care Teams.  These Care Teams include your primary Cardiologist (physician) and Advanced Practice Providers (APPs -  Physician Assistants and Nurse Practitioners) who all work together to provide you with the care you need, when you need it.  We recommend signing up for the patient portal called "MyChart".  Sign up information is provided on this After Visit Summary.  MyChart is used to connect with patients for Virtual Visits (Telemedicine).  Patients are able to view lab/test results, encounter notes, upcoming appointments, etc.  Non-urgent messages can be sent to your provider as well.   To learn more about what you can do with MyChart, go to [email or web address].    Your next appointment:   Follow up in one year with Dr. Duke Salvia

## 2023-03-24 DIAGNOSIS — E1059 Type 1 diabetes mellitus with other circulatory complications: Secondary | ICD-10-CM | POA: Diagnosis not present

## 2023-03-24 DIAGNOSIS — E1040 Type 1 diabetes mellitus with diabetic neuropathy, unspecified: Secondary | ICD-10-CM | POA: Diagnosis not present

## 2023-03-24 DIAGNOSIS — E039 Hypothyroidism, unspecified: Secondary | ICD-10-CM | POA: Diagnosis not present

## 2023-03-24 DIAGNOSIS — E7800 Pure hypercholesterolemia, unspecified: Secondary | ICD-10-CM | POA: Diagnosis not present

## 2023-03-24 DIAGNOSIS — Z125 Encounter for screening for malignant neoplasm of prostate: Secondary | ICD-10-CM | POA: Diagnosis not present

## 2023-03-24 DIAGNOSIS — E559 Vitamin D deficiency, unspecified: Secondary | ICD-10-CM | POA: Diagnosis not present

## 2023-03-24 DIAGNOSIS — Z9641 Presence of insulin pump (external) (internal): Secondary | ICD-10-CM | POA: Diagnosis not present

## 2023-05-05 DIAGNOSIS — I10 Essential (primary) hypertension: Secondary | ICD-10-CM | POA: Diagnosis not present

## 2023-05-05 DIAGNOSIS — Z794 Long term (current) use of insulin: Secondary | ICD-10-CM | POA: Diagnosis not present

## 2023-05-05 DIAGNOSIS — E108 Type 1 diabetes mellitus with unspecified complications: Secondary | ICD-10-CM | POA: Diagnosis not present

## 2023-05-05 DIAGNOSIS — E109 Type 1 diabetes mellitus without complications: Secondary | ICD-10-CM | POA: Diagnosis not present

## 2023-05-05 DIAGNOSIS — E1065 Type 1 diabetes mellitus with hyperglycemia: Secondary | ICD-10-CM | POA: Diagnosis not present

## 2023-05-05 DIAGNOSIS — Z23 Encounter for immunization: Secondary | ICD-10-CM | POA: Diagnosis not present

## 2023-05-27 ENCOUNTER — Other Ambulatory Visit: Payer: Self-pay | Admitting: Cardiovascular Disease

## 2023-06-08 DIAGNOSIS — E785 Hyperlipidemia, unspecified: Secondary | ICD-10-CM | POA: Diagnosis not present

## 2023-06-09 ENCOUNTER — Telehealth: Payer: Self-pay | Admitting: Cardiovascular Disease

## 2023-06-09 DIAGNOSIS — E785 Hyperlipidemia, unspecified: Secondary | ICD-10-CM

## 2023-06-09 LAB — HEPATIC FUNCTION PANEL
ALT: 13 [IU]/L (ref 0–44)
AST: 22 [IU]/L (ref 0–40)
Albumin: 4.5 g/dL (ref 3.9–4.9)
Alkaline Phosphatase: 70 [IU]/L (ref 44–121)
Bilirubin Total: 1 mg/dL (ref 0.0–1.2)
Bilirubin, Direct: 0.3 mg/dL (ref 0.00–0.40)
Total Protein: 6.9 g/dL (ref 6.0–8.5)

## 2023-06-09 LAB — LIPID PANEL
Chol/HDL Ratio: 3 {ratio} (ref 0.0–5.0)
Cholesterol, Total: 140 mg/dL (ref 100–199)
HDL: 47 mg/dL (ref >39)
LDL Chol Calc (NIH): 79 mg/dL (ref 0–99)
Triglycerides: 67 mg/dL (ref 0–149)
VLDL Cholesterol Cal: 14 mg/dL (ref 5–40)

## 2023-06-09 MED ORDER — ROSUVASTATIN CALCIUM 10 MG PO TABS: 10.0000 mg | ORAL_TABLET | Freq: Every day | ORAL | 2 refills | Status: DC

## 2023-06-09 NOTE — Telephone Encounter (Signed)
Reviewed lab results and recommendations. Updated Rx sent and lab slips mailed.   Alver Sorrow, NP

## 2023-06-09 NOTE — Telephone Encounter (Signed)
Pt is requesting a callback from nurse [facility] regarding his results. Please advise

## 2023-06-14 DIAGNOSIS — E7800 Pure hypercholesterolemia, unspecified: Secondary | ICD-10-CM | POA: Diagnosis not present

## 2023-06-14 DIAGNOSIS — Z9641 Presence of insulin pump (external) (internal): Secondary | ICD-10-CM | POA: Diagnosis not present

## 2023-06-14 DIAGNOSIS — E1140 Type 2 diabetes mellitus with diabetic neuropathy, unspecified: Secondary | ICD-10-CM | POA: Diagnosis not present

## 2023-06-14 DIAGNOSIS — E039 Hypothyroidism, unspecified: Secondary | ICD-10-CM | POA: Diagnosis not present

## 2023-06-14 DIAGNOSIS — E1059 Type 1 diabetes mellitus with other circulatory complications: Secondary | ICD-10-CM | POA: Diagnosis not present

## 2023-06-14 DIAGNOSIS — E1040 Type 1 diabetes mellitus with diabetic neuropathy, unspecified: Secondary | ICD-10-CM | POA: Diagnosis not present

## 2023-06-14 DIAGNOSIS — Z125 Encounter for screening for malignant neoplasm of prostate: Secondary | ICD-10-CM | POA: Diagnosis not present

## 2023-06-14 DIAGNOSIS — I10 Essential (primary) hypertension: Secondary | ICD-10-CM | POA: Diagnosis not present

## 2023-06-14 DIAGNOSIS — Z8679 Personal history of other diseases of the circulatory system: Secondary | ICD-10-CM | POA: Diagnosis not present

## 2023-06-21 DIAGNOSIS — E1065 Type 1 diabetes mellitus with hyperglycemia: Secondary | ICD-10-CM | POA: Diagnosis not present

## 2023-06-21 DIAGNOSIS — Z9641 Presence of insulin pump (external) (internal): Secondary | ICD-10-CM | POA: Diagnosis not present

## 2023-06-21 DIAGNOSIS — E7800 Pure hypercholesterolemia, unspecified: Secondary | ICD-10-CM | POA: Diagnosis not present

## 2023-06-21 DIAGNOSIS — E1040 Type 1 diabetes mellitus with diabetic neuropathy, unspecified: Secondary | ICD-10-CM | POA: Diagnosis not present

## 2023-06-21 DIAGNOSIS — E1140 Type 2 diabetes mellitus with diabetic neuropathy, unspecified: Secondary | ICD-10-CM | POA: Diagnosis not present

## 2023-06-21 DIAGNOSIS — E1059 Type 1 diabetes mellitus with other circulatory complications: Secondary | ICD-10-CM | POA: Diagnosis not present

## 2023-06-21 DIAGNOSIS — I10 Essential (primary) hypertension: Secondary | ICD-10-CM | POA: Diagnosis not present

## 2023-06-21 DIAGNOSIS — E039 Hypothyroidism, unspecified: Secondary | ICD-10-CM | POA: Diagnosis not present

## 2023-06-21 DIAGNOSIS — N1831 Chronic kidney disease, stage 3a: Secondary | ICD-10-CM | POA: Diagnosis not present

## 2023-06-21 DIAGNOSIS — E559 Vitamin D deficiency, unspecified: Secondary | ICD-10-CM | POA: Diagnosis not present

## 2023-07-08 DIAGNOSIS — N401 Enlarged prostate with lower urinary tract symptoms: Secondary | ICD-10-CM | POA: Diagnosis not present

## 2023-07-08 DIAGNOSIS — R3914 Feeling of incomplete bladder emptying: Secondary | ICD-10-CM | POA: Diagnosis not present

## 2023-07-13 ENCOUNTER — Ambulatory Visit (INDEPENDENT_AMBULATORY_CARE_PROVIDER_SITE_OTHER): Payer: 59 | Admitting: Podiatry

## 2023-07-13 ENCOUNTER — Encounter: Payer: Self-pay | Admitting: Podiatry

## 2023-07-13 DIAGNOSIS — E1042 Type 1 diabetes mellitus with diabetic polyneuropathy: Secondary | ICD-10-CM

## 2023-07-13 DIAGNOSIS — M2041 Other hammer toe(s) (acquired), right foot: Secondary | ICD-10-CM

## 2023-07-13 DIAGNOSIS — E119 Type 2 diabetes mellitus without complications: Secondary | ICD-10-CM

## 2023-07-13 DIAGNOSIS — L84 Corns and callosities: Secondary | ICD-10-CM | POA: Diagnosis not present

## 2023-07-13 DIAGNOSIS — M2042 Other hammer toe(s) (acquired), left foot: Secondary | ICD-10-CM | POA: Diagnosis not present

## 2023-07-13 DIAGNOSIS — B351 Tinea unguium: Secondary | ICD-10-CM

## 2023-07-13 NOTE — Progress Notes (Signed)
 ANNUAL DIABETIC FOOT EXAM  Subjective: Tom Baker presents today for diabetic foot evaluation and to establish at risk foot care. He is accompanied by his wife on today's visit. Chief Complaint  Patient presents with   [facility]    RM#16 DFC  A1C 6.9 BS not taken this am.   Patient confirms h/o diabetes.  He has long history of Type 1 diabetes. Has received consistent professional podiatry care from Dr. Nicholaus Haber, who has moved to [place], [place]. Patient is seeking care closer to his home here in [place].  Patient denies any h/o foot wounds.  Patient has prior h/o foot ulcer.  Patient has been diagnosed with neuropathy.  Gerome Brunet, DO is patient's PCP.  Past Medical History:  Diagnosis Date   BPH (benign prostatic hyperplasia)    Complication of anesthesia    hard to urinate after surgery ankle surgery 2005 [facility]   Diabetes mellitus type 1 (HCC)    GERD (gastroesophageal reflux disease)    Glaucoma    left eye   History of blood transfusion 12/2020   Hyperlipidemia    Hypertension    Hypothyroid    Pericardial effusion 2017   Pneumonia 2017   Post-nasal drip 08/02/2016   Runny nose    occ at times pe pt   Wears glasses    Patient Active Problem List   Diagnosis Date Noted   Cervical myelopathy (HCC) 09/29/2021   Closed right hip fracture (HCC) 12/13/2020   AKI (acute kidney injury) (HCC) 12/13/2020   BPH (benign prostatic hyperplasia) 12/13/2020   Post-nasal drip 08/02/2016   Atrial flutter (HCC)    Syncope 12/12/2015   Diabetes mellitus type 1 (HCC)    Hyperlipidemia    Hypertension 10/24/2010   Hypothyroidism 10/24/2010   Past Surgical History:  Procedure Laterality Date   ANKLE SURGERY Right 2010   [facility] plate and 6 screws still in   ANTERIOR CERVICAL DECOMPRESSION/DISCECTOMY FUSION 4 LEVELS N/A 09/29/2021   Procedure: Cervical Three-Four,  Cervical Four-Five,  Cervical Five-Six,  Cervical Six--Seven  Anterior Cervical Discectomy Fusion  with  possible Corpectomy-Cervical Five;  Surgeon: Colon Shove, MD;  Location: [facility] OR;  Service: Neurosurgery;  Laterality: N/A;  3C/RM 21   CHOLECYSTECTOMY  1995   laparoscopic   CYSTOSCOPY WITH INSERTION OF UROLIFT N/A 09/16/2020   Procedure: CYSTOSCOPY WITH INSERTION OF UROLIFT; URETHRAL DILATION;  Surgeon: Nieves Cough, MD;  Location: [facility];  Service: Urology;  Laterality: N/A;   EYE SURGERY Left 2003   lens replacement for cataract   INTRAMEDULLARY (IM) NAIL INTERTROCHANTERIC Right 12/13/2020   Procedure: INTRAMEDULLARY (IM) NAIL INTERTROCHANTRIC;  Surgeon: Celena Sharper, MD;  Location: [facility] OR;  Service: Orthopedics;  Laterality: Right;   KNEE SURGERY Right 1988   arthroscopic   radioactive iodine  to thryoid  2000   TOE SURGERY Left 1972   great toe   VITRECTOMY  1999   Current Outpatient Medications on File Prior to Visit  Medication Sig Dispense Refill   amLODipine  (NORVASC ) 5 MG tablet Take 1 tablet (5 mg total) by mouth daily. 30 tablet 0   aspirin  EC 81 MG tablet Take 81 mg by mouth at bedtime.     calcium  carbonate (TUMS - DOSED IN MG ELEMENTAL CALCIUM ) 500 MG chewable tablet Chew 2 tablets by mouth daily as needed for indigestion or heartburn.     insulin  aspart (NOVOLOG ) 100 UNIT/ML injection Inject into the skin continuous. Use in insulin  pump     Insulin  Infusion  Pump DEVI 50 Units by Does not apply route continuous. Novolog  Insulin      Lancets (ONETOUCH ULTRASOFT) lancets 1 each by Other route 6 (six) times daily.  0   levothyroxine  (SYNTHROID , LEVOTHROID) 125 MCG tablet Take 125 mcg by mouth daily.     losartan  (COZAAR ) 100 MG tablet Take 100 mg by mouth daily.     ONETOUCH VERIO test strip 1 each by Other route 6 (six) times daily.     Polyethylene Glycol 3350  (MIRALAX  PO) Take 17 g by mouth every other day. At night     rosuvastatin  (CRESTOR ) 10 MG tablet Take 1 tablet (10 mg total) by mouth daily. 90 tablet 2   tadalafil  (CIALIS ) 5 MG tablet Take 5  mg by mouth daily.     Tetrahydrozoline HCl (VISINE OP) Place 1-2 drops into both eyes 2 (two) times daily.     timolol  (BETIMOL ) 0.5 % ophthalmic solution Place 1 drop into the left eye 2 (two) times daily.     Cholecalciferol  125 MCG (5000 UT) TABS Take 1 tablet (5,000 Units total) by mouth daily. 30 tablet 6   oxyCODONE -acetaminophen  (PERCOCET/ROXICET) 5-325 MG tablet Take 1-2 tablets by mouth every 6 (six) hours as needed for severe pain. 20 tablet 0   No current facility-administered medications on file prior to visit.    No Known Allergies Social History   Occupational History   Not on file  Tobacco Use   Smoking status: Never   Smokeless tobacco: Never  Vaping Use   Vaping status: Never Used  Substance and Sexual Activity   Alcohol  use: No    Alcohol /week: 0.0 standard drinks of alcohol     Comment: last used 1992   Drug use: No   Sexual activity: Not on file   Family History  Problem Relation Age of Onset   Bone cancer Father     There is no immunization history on file for this patient.   Review of Systems: Negative except as noted in the HPI.   Objective: There were no vitals filed for this visit.  Tom Baker is a pleasant 68 y.o. male in NAD. AAO X 3.  Title   Diabetic Foot Exam - detailed Date & Time: 07/13/2023 11:00 AM Diabetic Foot exam was performed with the following findings: Yes  Visual Foot Exam completed.: Yes  Is there a history of foot ulcer?: Yes Is there a foot ulcer now?: No Is there swelling?: No Is there elevated skin temperature?: No Is there abnormal foot shape?: Yes Is there a claw toe deformity?: No Are the toenails long?: Yes Are the toenails thick?: Yes Are the toenails ingrown?: No Is the skin thin, fragile, shiny and hairless?: Yes Normal Range of Motion?: Yes Is there foot or ankle muscle weakness?: Yes Do you have pain in calf while walking?: No Are the shoes appropriate in style and fit?: Yes Can the patient see the  bottom of their feet?: Yes Pulse Foot Exam completed.: Yes   Right Posterior Tibialis: Present Left posterior Tibialis: Present   Right Dorsalis Pedis: Present Left Dorsalis Pedis: Present     Sensory Foot Exam Completed.: Yes Semmes-Weinstein Monofilament Test + means has sensation and - means no sensation  R Foot Test Control: Pos L Foot Test Control: Pos   R Site 1-Great Toe: Pos L Site 1-Great Toe: Pos   R Site 4: Pos L Site 4: Pos   R site 5: Neg L Site 5: Neg  R Site 6:  Neg L Site 6: Neg     Image components are not supported.   Image components are not supported. Image components are not supported.  T[place] Right vibratory: present Left vibratory: present  Comments No open wounds b/l LE. Hyperkeratotic lesion(s) submet head 1 left foot, sub 5th met base left foot and submet head 5 b/l.  No erythema, no edema, no drainage, no fluctuance.  Muscle strength 5/5 plantarflexion, abduction and adduction. Dorsiflexors 3/5 b/l. Hammertoe deformity noted 2-5 b/l. Ambulates independently.      Lab Results  Component Value Date   HGBA1C 7.7 (H) 09/17/2021   ADA Risk Categorization: High Risk  Patient has one or more of the following: Loss of protective sensation Absent pedal pulses Severe Foot deformity History of foot ulcer  Assessment: 1. Onychomycosis   2. Callus   3. Acquired hammertoes of both feet   4. Type 1 diabetes mellitus with diabetic polyneuropathy (HCC)   5. Encounter for diabetic foot exam [facility])     Plan: -Patient was evaluated today. All questions/concerns addressed on today's visit. -Patient's family member present. All questions/concerns addressed on today's visit. -Diabetic foot examination performed today. -Continue diabetic foot care principles: inspect feet daily, monitor glucose as recommended by PCP and/or Endocrinologist, and follow prescribed diet per PCP, Endocrinologist and/or dietician. -Patient to continue soft, supportive  shoe gear daily. -Toenails 1-5 b/l were debrided in length and girth with sterile nail nippers and dremel without iatrogenic bleeding.  -Callus(es) submet head 1 left foot, submet head 5 b/l, and sub 5th met base left foot pared utilizing sterile scalpel blade without complication or incident. Total number debrided =4. -Patient/POA to call should there be question/concern in the interim. Return in about 9 weeks (around 09/14/2023).  Tom Baker, DPM      [place] LOCATION: [address] [place] [address]                   Office [phone]   [facility] LOCATION: [address] [place], [place] [address] Office [phone]

## 2023-07-29 DIAGNOSIS — Z794 Long term (current) use of insulin: Secondary | ICD-10-CM | POA: Diagnosis not present

## 2023-07-29 DIAGNOSIS — E1065 Type 1 diabetes mellitus with hyperglycemia: Secondary | ICD-10-CM | POA: Diagnosis not present

## 2023-07-29 DIAGNOSIS — E108 Type 1 diabetes mellitus with unspecified complications: Secondary | ICD-10-CM | POA: Diagnosis not present

## 2023-07-29 DIAGNOSIS — I10 Essential (primary) hypertension: Secondary | ICD-10-CM | POA: Diagnosis not present

## 2023-07-29 DIAGNOSIS — E109 Type 1 diabetes mellitus without complications: Secondary | ICD-10-CM | POA: Diagnosis not present

## 2023-08-03 DIAGNOSIS — E109 Type 1 diabetes mellitus without complications: Secondary | ICD-10-CM | POA: Diagnosis not present

## 2023-08-03 DIAGNOSIS — Z794 Long term (current) use of insulin: Secondary | ICD-10-CM | POA: Diagnosis not present

## 2023-08-03 DIAGNOSIS — E108 Type 1 diabetes mellitus with unspecified complications: Secondary | ICD-10-CM | POA: Diagnosis not present

## 2023-08-03 DIAGNOSIS — E1065 Type 1 diabetes mellitus with hyperglycemia: Secondary | ICD-10-CM | POA: Diagnosis not present

## 2023-08-03 DIAGNOSIS — I10 Essential (primary) hypertension: Secondary | ICD-10-CM | POA: Diagnosis not present

## 2023-08-09 DIAGNOSIS — E785 Hyperlipidemia, unspecified: Secondary | ICD-10-CM | POA: Diagnosis not present

## 2023-08-09 LAB — LIPID PANEL

## 2023-08-10 ENCOUNTER — Telehealth (HOSPITAL_BASED_OUTPATIENT_CLINIC_OR_DEPARTMENT_OTHER): Payer: Self-pay | Admitting: Cardiovascular Disease

## 2023-08-10 LAB — LIPID PANEL
Cholesterol, Total: 136 mg/dL (ref 100–199)
HDL: 49 mg/dL (ref >39)
LDL CALC COMMENT:: 2.8 ratio (ref 0.0–5.0)
LDL Chol Calc (NIH): 74 mg/dL (ref 0–99)
Triglycerides: 60 mg/dL (ref 0–149)
VLDL Cholesterol Cal: 13 mg/dL (ref 5–40)

## 2023-08-10 LAB — HEPATIC FUNCTION PANEL
ALT: 13 [IU]/L (ref 0–44)
AST: 18 [IU]/L (ref 0–40)
Albumin: 4.4 g/dL (ref 3.9–4.9)
Alkaline Phosphatase: 69 [IU]/L (ref 44–121)
Bilirubin Total: 1.4 mg/dL — ABNORMAL HIGH (ref 0.0–1.2)
Bilirubin, Direct: 0.4 mg/dL (ref 0.00–0.40)
Total Protein: 6.6 g/dL (ref 6.0–8.5)

## 2023-08-10 NOTE — Telephone Encounter (Signed)
 Patient called to follow-up on lab results.

## 2023-08-10 NOTE — Telephone Encounter (Signed)
 Spoke with patient and he did increase Crestor  to 10 mg daily  Advised labs not reviewed but will reach back out when they have been

## 2023-08-11 ENCOUNTER — Telehealth (HOSPITAL_BASED_OUTPATIENT_CLINIC_OR_DEPARTMENT_OTHER): Payer: Self-pay

## 2023-08-11 ENCOUNTER — Encounter (HOSPITAL_BASED_OUTPATIENT_CLINIC_OR_DEPARTMENT_OTHER): Payer: Self-pay

## 2023-08-11 NOTE — Telephone Encounter (Signed)
-----   Message from Clearnce Curia sent at 08/11/2023  7:57 AM EST ----- Normal liver enzymes. Mildly elevated bilirubin not of concern. Cholesterol numbers improving LDL nearing goal of <70. Continue Rosuvastatin  10mg  daily.

## 2023-08-11 NOTE — Telephone Encounter (Signed)
 Called and spoke to pt. Pt aware of results and APP's recommendations. He verbalized understanding.  ----- Message from Reche GORMAN Finder sent at 08/11/2023  7:57 AM EST ----- Normal liver enzymes. Mildly elevated bilirubin not of concern. Cholesterol numbers improving LDL nearing goal of <70. Continue Rosuvastatin  10mg  daily.

## 2023-08-11 NOTE — Telephone Encounter (Signed)
 Addressed via lab results.   Darnette Lampron S Tiegan Jambor, NP

## 2023-09-16 ENCOUNTER — Encounter: Payer: Self-pay | Admitting: Podiatry

## 2023-09-16 ENCOUNTER — Ambulatory Visit (INDEPENDENT_AMBULATORY_CARE_PROVIDER_SITE_OTHER): Payer: 59 | Admitting: Podiatry

## 2023-09-16 DIAGNOSIS — E7800 Pure hypercholesterolemia, unspecified: Secondary | ICD-10-CM | POA: Diagnosis not present

## 2023-09-16 DIAGNOSIS — B351 Tinea unguium: Secondary | ICD-10-CM | POA: Diagnosis not present

## 2023-09-16 DIAGNOSIS — E039 Hypothyroidism, unspecified: Secondary | ICD-10-CM | POA: Diagnosis not present

## 2023-09-16 DIAGNOSIS — M79674 Pain in right toe(s): Secondary | ICD-10-CM | POA: Diagnosis not present

## 2023-09-16 DIAGNOSIS — E1042 Type 1 diabetes mellitus with diabetic polyneuropathy: Secondary | ICD-10-CM | POA: Diagnosis not present

## 2023-09-16 DIAGNOSIS — I10 Essential (primary) hypertension: Secondary | ICD-10-CM | POA: Diagnosis not present

## 2023-09-16 DIAGNOSIS — M79675 Pain in left toe(s): Secondary | ICD-10-CM | POA: Diagnosis not present

## 2023-09-16 DIAGNOSIS — L84 Corns and callosities: Secondary | ICD-10-CM

## 2023-09-16 DIAGNOSIS — Z9641 Presence of insulin pump (external) (internal): Secondary | ICD-10-CM | POA: Diagnosis not present

## 2023-09-16 DIAGNOSIS — R1110 Vomiting, unspecified: Secondary | ICD-10-CM | POA: Diagnosis not present

## 2023-09-16 DIAGNOSIS — E1065 Type 1 diabetes mellitus with hyperglycemia: Secondary | ICD-10-CM | POA: Diagnosis not present

## 2023-09-20 DIAGNOSIS — Z09 Encounter for follow-up examination after completed treatment for conditions other than malignant neoplasm: Secondary | ICD-10-CM | POA: Diagnosis not present

## 2023-09-20 DIAGNOSIS — Z860100 Personal history of colon polyps, unspecified: Secondary | ICD-10-CM | POA: Diagnosis not present

## 2023-09-20 DIAGNOSIS — D124 Benign neoplasm of descending colon: Secondary | ICD-10-CM | POA: Diagnosis not present

## 2023-09-25 NOTE — Progress Notes (Signed)
  Subjective:  Patient ID: Tom Baker, male    DOB: 13-Jun-1956,  MRN: [id number]  Tom Baker presents to clinic today for at risk foot care with history of diabetic neuropathy and painful, elongated thickened toenails x 10 which are symptomatic when wearing enclosed shoe gear. This interferes with his/her daily activities.  Chief Complaint  Patient presents with   Diabetes    "Trim my nails and my calluses."  Dr. Talmage Nap - 09/16/2023, last A1c 6.9   New problem(s): None.   PCP is Irena Reichmann, DO.  No Known Allergies  Review of Systems: Negative except as noted in the HPI.  Objective: No changes noted in today's physical examination. There were no vitals filed for this visit. Tom Baker is a pleasant 68 y.o. male in NAD. AAO x 3.  Vascular Examination: Capillary refill time immediate b/l. Vascular status intact b/l with palpable pedal pulses. Pedal hair present b/l. No pain with calf compression b/l. Skin temperature gradient WNL b/l. No cyanosis or clubbing b/l. No ischemia or gangrene noted b/l.   Neurological Examination: Pt has subjective symptoms of neuropathy. Protective sensation decreased with 10 gram monofilament b/l.  Dermatological Examination: Pedal skin with normal turgor, texture and tone b/l.  No open wounds. No interdigital macerations.   Toenails 1-5 b/l thick, discolored, elongated with subungual debris and pain on dorsal palpation.   Hyperkeratotic lesion(s) submet head 1 left foot, submet head 5 b/l, and sub 5th met base left foot.  No erythema, no edema, no drainage, no fluctuance.  Musculoskeletal Examination: Muscle strength 5/5 to all LE muscle groups of both feet with exception of dorsiflexors. Muscle strength 3/5 to all LE muscle groups of dorsiflexors b/l. Hammertoe deformity noted 2-5 b/l.  Radiographs: None  Assessment/Plan: 1. Pain due to onychomycosis of toenails of both feet   2. Callus   3. Type 1 diabetes mellitus with diabetic  polyneuropathy [facility])     Consent given for treatment. Patient examined. All patient's and/or POA's questions/concerns addressed on today's visit. Mycotic toenails 1-5 debrided in length and girth without incident. Callus(es) submet head 1 left foot, submet head 5 b/l, and sub 5th met base left foot pared with sharp debridement without incident.Continue soft, supportive shoe gear daily. Report any pedal injuries to medical professional. Call office if there are any quesitons/concerns. -Patient/POA to call should there be question/concern in the interim.   Return in about 3 months (around 12/17/2023).  Freddie Breech, DPM      [place] LOCATION: [address] [place] [address]                   Office [phone]   [facility] LOCATION: [address] [place], [place] [address] Office [phone]

## 2023-10-05 DIAGNOSIS — H3342 Traction detachment of retina, left eye: Secondary | ICD-10-CM | POA: Diagnosis not present

## 2023-10-05 DIAGNOSIS — E113512 Type 2 diabetes mellitus with proliferative diabetic retinopathy with macular edema, left eye: Secondary | ICD-10-CM | POA: Diagnosis not present

## 2023-10-05 DIAGNOSIS — H59813 Chorioretinal scars after surgery for detachment, bilateral: Secondary | ICD-10-CM | POA: Diagnosis not present

## 2023-10-05 DIAGNOSIS — H35033 Hypertensive retinopathy, bilateral: Secondary | ICD-10-CM | POA: Diagnosis not present

## 2023-10-05 DIAGNOSIS — H3582 Retinal ischemia: Secondary | ICD-10-CM | POA: Diagnosis not present

## 2023-10-05 DIAGNOSIS — E113591 Type 2 diabetes mellitus with proliferative diabetic retinopathy without macular edema, right eye: Secondary | ICD-10-CM | POA: Diagnosis not present

## 2023-10-05 DIAGNOSIS — H35373 Puckering of macula, bilateral: Secondary | ICD-10-CM | POA: Diagnosis not present

## 2023-11-10 DIAGNOSIS — E109 Type 1 diabetes mellitus without complications: Secondary | ICD-10-CM | POA: Diagnosis not present

## 2023-11-10 DIAGNOSIS — E108 Type 1 diabetes mellitus with unspecified complications: Secondary | ICD-10-CM | POA: Diagnosis not present

## 2023-11-10 DIAGNOSIS — E1065 Type 1 diabetes mellitus with hyperglycemia: Secondary | ICD-10-CM | POA: Diagnosis not present

## 2023-11-10 DIAGNOSIS — Z794 Long term (current) use of insulin: Secondary | ICD-10-CM | POA: Diagnosis not present

## 2023-11-10 DIAGNOSIS — I10 Essential (primary) hypertension: Secondary | ICD-10-CM | POA: Diagnosis not present

## 2023-11-25 ENCOUNTER — Ambulatory Visit (INDEPENDENT_AMBULATORY_CARE_PROVIDER_SITE_OTHER): Admitting: Podiatry

## 2023-11-25 ENCOUNTER — Encounter: Payer: Self-pay | Admitting: Podiatry

## 2023-11-25 DIAGNOSIS — E1042 Type 1 diabetes mellitus with diabetic polyneuropathy: Secondary | ICD-10-CM | POA: Diagnosis not present

## 2023-11-25 DIAGNOSIS — L84 Corns and callosities: Secondary | ICD-10-CM | POA: Diagnosis not present

## 2023-11-25 DIAGNOSIS — M79675 Pain in left toe(s): Secondary | ICD-10-CM

## 2023-11-25 DIAGNOSIS — B351 Tinea unguium: Secondary | ICD-10-CM

## 2023-11-25 DIAGNOSIS — M79674 Pain in right toe(s): Secondary | ICD-10-CM

## 2023-11-29 NOTE — Progress Notes (Signed)
  Subjective:  Patient ID: Tom Baker, male    DOB: 12-10-1955,  MRN: [id number]  Tom Baker presents to clinic today for at risk foot care with history of diabetic neuropathy and painful elongated mycotic toenails 1-5 bilaterally which are tender when wearing enclosed shoe gear. Pain is relieved with periodic professional debridement.  Chief Complaint  Patient presents with   Nail Problem    RFC   New problem(s): None.   PCP is Pete Brand, DO.  No Known Allergies  Review of Systems: Negative except as noted in the HPI.  Objective: No changes noted in today's physical examination. There were no vitals filed for this visit. Tom Baker is a pleasant 68 y.o. male in NAD. AAO x 3.  Vascular Examination: Capillary refill time immediate b/l. Vascular status intact b/l with palpable pedal pulses. Pedal hair present b/l. No pain with calf compression b/l. Skin temperature gradient WNL b/l. No cyanosis or clubbing b/l. No ischemia or gangrene noted b/l.   Neurological Examination: Pt has subjective symptoms of neuropathy. Protective sensation decreased with 10 gram monofilament b/l.  Dermatological Examination: Pedal skin with normal turgor, texture and tone b/l.  No open wounds. No interdigital macerations.   Toenails 1-5 b/l thick, discolored, elongated with subungual debris and pain on dorsal palpation.   Hyperkeratotic lesion(s) submet head 5 b/l.  No erythema, no edema, no drainage, no fluctuance.  Musculoskeletal Examination: Muscle strength 5/5 to all LE muscle groups of both feet with exception of dorsiflexors. Muscle strength 3/5 to all LE muscle groups of dorsiflexors b/l. Hammertoe deformity noted 2-5 b/l.  Radiographs: None  Assessment/Plan: 1. Pain due to onychomycosis of toenails of both feet   2. Callus   3. Type 1 diabetes mellitus with diabetic polyneuropathy (HCC)   Patient was evaluated and treated. All patient's and/or POA's questions/concerns  addressed on today's visit. Toenails 1-5 debrided in length and girth without incident. Callus(es) submet head 5 b/l pared with sharp debridement without incident. Continue soft, supportive shoe gear daily. Report any pedal injuries to medical professional. Call office if there are any questions/concerns. -Patient would like diabetic shoes from [employer]. Return in about 3 months (around 02/25/2024).  Tom Baker, DPM      [place] LOCATION: [address] [place] [address]                   Office [phone]   [facility] LOCATION: [address] [place], [place] [address] Office [phone]

## 2024-01-03 DIAGNOSIS — E113393 Type 2 diabetes mellitus with moderate nonproliferative diabetic retinopathy without macular edema, bilateral: Secondary | ICD-10-CM | POA: Diagnosis not present

## 2024-01-03 DIAGNOSIS — H2511 Age-related nuclear cataract, right eye: Secondary | ICD-10-CM | POA: Diagnosis not present

## 2024-01-03 DIAGNOSIS — H35371 Puckering of macula, right eye: Secondary | ICD-10-CM | POA: Diagnosis not present

## 2024-01-03 DIAGNOSIS — H401121 Primary open-angle glaucoma, left eye, mild stage: Secondary | ICD-10-CM | POA: Diagnosis not present

## 2024-01-03 DIAGNOSIS — H18413 Arcus senilis, bilateral: Secondary | ICD-10-CM | POA: Diagnosis not present

## 2024-01-03 DIAGNOSIS — Z961 Presence of intraocular lens: Secondary | ICD-10-CM | POA: Diagnosis not present

## 2024-01-17 ENCOUNTER — Other Ambulatory Visit: Payer: Self-pay | Admitting: Family

## 2024-01-17 DIAGNOSIS — E1059 Type 1 diabetes mellitus with other circulatory complications: Secondary | ICD-10-CM | POA: Diagnosis not present

## 2024-01-17 DIAGNOSIS — E1140 Type 2 diabetes mellitus with diabetic neuropathy, unspecified: Secondary | ICD-10-CM | POA: Diagnosis not present

## 2024-01-17 DIAGNOSIS — I10 Essential (primary) hypertension: Secondary | ICD-10-CM | POA: Diagnosis not present

## 2024-01-17 DIAGNOSIS — E1040 Type 1 diabetes mellitus with diabetic neuropathy, unspecified: Secondary | ICD-10-CM | POA: Diagnosis not present

## 2024-01-17 DIAGNOSIS — E039 Hypothyroidism, unspecified: Secondary | ICD-10-CM | POA: Diagnosis not present

## 2024-01-17 DIAGNOSIS — E7800 Pure hypercholesterolemia, unspecified: Secondary | ICD-10-CM | POA: Diagnosis not present

## 2024-01-17 DIAGNOSIS — Z125 Encounter for screening for malignant neoplasm of prostate: Secondary | ICD-10-CM | POA: Diagnosis not present

## 2024-01-17 DIAGNOSIS — Z9641 Presence of insulin pump (external) (internal): Secondary | ICD-10-CM | POA: Diagnosis not present

## 2024-01-17 DIAGNOSIS — Z8679 Personal history of other diseases of the circulatory system: Secondary | ICD-10-CM | POA: Diagnosis not present

## 2024-01-17 DIAGNOSIS — E785 Hyperlipidemia, unspecified: Secondary | ICD-10-CM

## 2024-01-18 DIAGNOSIS — E559 Vitamin D deficiency, unspecified: Secondary | ICD-10-CM | POA: Diagnosis not present

## 2024-01-18 DIAGNOSIS — E108 Type 1 diabetes mellitus with unspecified complications: Secondary | ICD-10-CM | POA: Diagnosis not present

## 2024-01-18 DIAGNOSIS — Z9641 Presence of insulin pump (external) (internal): Secondary | ICD-10-CM | POA: Diagnosis not present

## 2024-01-18 DIAGNOSIS — E1140 Type 2 diabetes mellitus with diabetic neuropathy, unspecified: Secondary | ICD-10-CM | POA: Diagnosis not present

## 2024-01-18 DIAGNOSIS — E1065 Type 1 diabetes mellitus with hyperglycemia: Secondary | ICD-10-CM | POA: Diagnosis not present

## 2024-01-18 DIAGNOSIS — I10 Essential (primary) hypertension: Secondary | ICD-10-CM | POA: Diagnosis not present

## 2024-01-18 DIAGNOSIS — N1831 Chronic kidney disease, stage 3a: Secondary | ICD-10-CM | POA: Diagnosis not present

## 2024-01-18 DIAGNOSIS — E1059 Type 1 diabetes mellitus with other circulatory complications: Secondary | ICD-10-CM | POA: Diagnosis not present

## 2024-01-18 DIAGNOSIS — E039 Hypothyroidism, unspecified: Secondary | ICD-10-CM | POA: Diagnosis not present

## 2024-01-18 DIAGNOSIS — Z8679 Personal history of other diseases of the circulatory system: Secondary | ICD-10-CM | POA: Diagnosis not present

## 2024-01-18 DIAGNOSIS — E1040 Type 1 diabetes mellitus with diabetic neuropathy, unspecified: Secondary | ICD-10-CM | POA: Diagnosis not present

## 2024-01-18 DIAGNOSIS — Z794 Long term (current) use of insulin: Secondary | ICD-10-CM | POA: Diagnosis not present

## 2024-01-18 DIAGNOSIS — E109 Type 1 diabetes mellitus without complications: Secondary | ICD-10-CM | POA: Diagnosis not present

## 2024-01-18 DIAGNOSIS — E7800 Pure hypercholesterolemia, unspecified: Secondary | ICD-10-CM | POA: Diagnosis not present

## 2024-01-18 DIAGNOSIS — I483 Typical atrial flutter: Secondary | ICD-10-CM | POA: Diagnosis not present

## 2024-01-24 DIAGNOSIS — E1059 Type 1 diabetes mellitus with other circulatory complications: Secondary | ICD-10-CM | POA: Diagnosis not present

## 2024-01-24 DIAGNOSIS — E1040 Type 1 diabetes mellitus with diabetic neuropathy, unspecified: Secondary | ICD-10-CM | POA: Diagnosis not present

## 2024-01-24 DIAGNOSIS — Z0000 Encounter for general adult medical examination without abnormal findings: Secondary | ICD-10-CM | POA: Diagnosis not present

## 2024-01-24 DIAGNOSIS — H6120 Impacted cerumen, unspecified ear: Secondary | ICD-10-CM | POA: Diagnosis not present

## 2024-01-24 DIAGNOSIS — Z9641 Presence of insulin pump (external) (internal): Secondary | ICD-10-CM | POA: Diagnosis not present

## 2024-01-24 DIAGNOSIS — Z8679 Personal history of other diseases of the circulatory system: Secondary | ICD-10-CM | POA: Diagnosis not present

## 2024-01-24 DIAGNOSIS — N1831 Chronic kidney disease, stage 3a: Secondary | ICD-10-CM | POA: Diagnosis not present

## 2024-01-24 DIAGNOSIS — I129 Hypertensive chronic kidney disease with stage 1 through stage 4 chronic kidney disease, or unspecified chronic kidney disease: Secondary | ICD-10-CM | POA: Diagnosis not present

## 2024-01-27 ENCOUNTER — Encounter: Payer: Self-pay | Admitting: Podiatry

## 2024-01-27 ENCOUNTER — Ambulatory Visit (INDEPENDENT_AMBULATORY_CARE_PROVIDER_SITE_OTHER): Admitting: Podiatry

## 2024-01-27 DIAGNOSIS — E1042 Type 1 diabetes mellitus with diabetic polyneuropathy: Secondary | ICD-10-CM

## 2024-01-27 DIAGNOSIS — M79675 Pain in left toe(s): Secondary | ICD-10-CM

## 2024-01-27 DIAGNOSIS — L84 Corns and callosities: Secondary | ICD-10-CM | POA: Diagnosis not present

## 2024-01-27 DIAGNOSIS — B351 Tinea unguium: Secondary | ICD-10-CM

## 2024-01-27 DIAGNOSIS — M79674 Pain in right toe(s): Secondary | ICD-10-CM

## 2024-01-30 NOTE — Progress Notes (Signed)
  Subjective:  Patient ID: Tom Baker, male    DOB: 26-Feb-1956,  MRN: [id number]  Tom Baker presents to clinic today for at risk foot care with history of diabetic neuropathy and callus(es) of both feet and painful mycotic toenails that are difficult to trim. Painful toenails interfere with ambulation. Aggravating factors include wearing enclosed shoe gear. Pain is relieved with periodic professional debridement. Painful calluses are aggravated when weightbearing with and without shoegear. Pain is relieved with periodic professional debridement.  Chief Complaint  Patient presents with   Nail Problem    Thick painful toenails, 9 week follow up    New problem(s): None.   PCP is Gerome Brunet, DO. [place] 0715/2025.  No Known Allergies  Review of Systems: Negative except as noted in the HPI.  Objective: No changes noted in today's physical examination. There were no vitals filed for this visit. Tom Baker is a pleasant 68 y.o. male in NAD. AAO x 3.  Vascular Examination: Capillary refill time immediate b/l. Vascular status intact b/l with palpable pedal pulses. Pedal hair present b/l. No pain with calf compression b/l. Skin temperature gradient WNL b/l. No cyanosis or clubbing b/l. No ischemia or gangrene noted b/l.   Neurological Examination: Pt has subjective symptoms of neuropathy. Protective sensation decreased with 10 gram monofilament b/l.  Dermatological Examination: Pedal skin with normal turgor, texture and tone b/l.  No open wounds. No interdigital macerations.   Toenails 1-5 b/l thick, discolored, elongated with subungual debris and pain on dorsal palpation.   Hyperkeratotic lesion(s) submet head 5 b/l.  No erythema, no edema, no drainage, no fluctuance.  Musculoskeletal Examination: Muscle strength 5/5 to all LE muscle groups of both feet with exception of dorsiflexors. Muscle strength 3/5 to all LE muscle groups of dorsiflexors b/l. Hammertoe deformity noted 2-5  b/l.  Radiographs: None  Assessment/Plan: 1. Pain due to onychomycosis of toenails of both feet   2. Callus   3. Type 1 diabetes mellitus with diabetic polyneuropathy [facility])    Consent given for treatment. Patient examined.All patient's and/or POA's questions/concerns addressed on today's visit. Toenails 1-5 debrided in length and girth without incident. Callus(es) submet head 5 b/l pared with sharp debridement without incident. Continue foot and shoe inspections daily. Monitor blood glucose per PCP/Endocrinologist's recommendations.Continue soft, supportive shoe gear daily. Report any pedal injuries to medical professional. Call office if there are any questions/concerns. -Patient/POA to call should there be question/concern in the interim.   Return in about 3 months (around 04/28/2024).  Delon LITTIE Merlin, DPM      [place] LOCATION: [address] [place] [address]                   Office [phone]   [facility] LOCATION: [address] [place], [place] [address] Office [phone]

## 2024-01-31 DIAGNOSIS — I10 Essential (primary) hypertension: Secondary | ICD-10-CM | POA: Diagnosis not present

## 2024-01-31 DIAGNOSIS — E108 Type 1 diabetes mellitus with unspecified complications: Secondary | ICD-10-CM | POA: Diagnosis not present

## 2024-01-31 DIAGNOSIS — E109 Type 1 diabetes mellitus without complications: Secondary | ICD-10-CM | POA: Diagnosis not present

## 2024-01-31 DIAGNOSIS — Z794 Long term (current) use of insulin: Secondary | ICD-10-CM | POA: Diagnosis not present

## 2024-01-31 DIAGNOSIS — E1065 Type 1 diabetes mellitus with hyperglycemia: Secondary | ICD-10-CM | POA: Diagnosis not present

## 2024-02-15 DIAGNOSIS — H2511 Age-related nuclear cataract, right eye: Secondary | ICD-10-CM | POA: Diagnosis not present

## 2024-02-17 DIAGNOSIS — S0501XA Injury of conjunctiva and corneal abrasion without foreign body, right eye, initial encounter: Secondary | ICD-10-CM | POA: Diagnosis not present

## 2024-02-23 DIAGNOSIS — H25011 Cortical age-related cataract, right eye: Secondary | ICD-10-CM | POA: Diagnosis not present

## 2024-03-14 DIAGNOSIS — Z794 Long term (current) use of insulin: Secondary | ICD-10-CM | POA: Diagnosis not present

## 2024-03-14 DIAGNOSIS — I10 Essential (primary) hypertension: Secondary | ICD-10-CM | POA: Diagnosis not present

## 2024-03-14 DIAGNOSIS — E109 Type 1 diabetes mellitus without complications: Secondary | ICD-10-CM | POA: Diagnosis not present

## 2024-03-14 DIAGNOSIS — E108 Type 1 diabetes mellitus with unspecified complications: Secondary | ICD-10-CM | POA: Diagnosis not present

## 2024-03-14 DIAGNOSIS — E1065 Type 1 diabetes mellitus with hyperglycemia: Secondary | ICD-10-CM | POA: Diagnosis not present

## 2024-03-26 DIAGNOSIS — E109 Type 1 diabetes mellitus without complications: Secondary | ICD-10-CM | POA: Diagnosis not present

## 2024-04-09 DIAGNOSIS — E109 Type 1 diabetes mellitus without complications: Secondary | ICD-10-CM | POA: Diagnosis not present

## 2024-04-09 DIAGNOSIS — E1065 Type 1 diabetes mellitus with hyperglycemia: Secondary | ICD-10-CM | POA: Diagnosis not present

## 2024-04-13 ENCOUNTER — Ambulatory Visit: Admitting: Podiatry

## 2024-04-13 DIAGNOSIS — B351 Tinea unguium: Secondary | ICD-10-CM

## 2024-04-13 DIAGNOSIS — M2042 Other hammer toe(s) (acquired), left foot: Secondary | ICD-10-CM

## 2024-04-13 DIAGNOSIS — M79674 Pain in right toe(s): Secondary | ICD-10-CM

## 2024-04-13 DIAGNOSIS — M21961 Unspecified acquired deformity of right lower leg: Secondary | ICD-10-CM

## 2024-04-13 DIAGNOSIS — E1042 Type 1 diabetes mellitus with diabetic polyneuropathy: Secondary | ICD-10-CM

## 2024-04-13 DIAGNOSIS — M21962 Unspecified acquired deformity of left lower leg: Secondary | ICD-10-CM

## 2024-04-13 DIAGNOSIS — M2041 Other hammer toe(s) (acquired), right foot: Secondary | ICD-10-CM

## 2024-04-13 DIAGNOSIS — L84 Corns and callosities: Secondary | ICD-10-CM

## 2024-04-13 DIAGNOSIS — M79675 Pain in left toe(s): Secondary | ICD-10-CM

## 2024-04-16 DIAGNOSIS — Z23 Encounter for immunization: Secondary | ICD-10-CM | POA: Diagnosis not present

## 2024-04-19 DIAGNOSIS — E1140 Type 2 diabetes mellitus with diabetic neuropathy, unspecified: Secondary | ICD-10-CM | POA: Diagnosis not present

## 2024-04-19 DIAGNOSIS — E1059 Type 1 diabetes mellitus with other circulatory complications: Secondary | ICD-10-CM | POA: Diagnosis not present

## 2024-04-19 DIAGNOSIS — E1040 Type 1 diabetes mellitus with diabetic neuropathy, unspecified: Secondary | ICD-10-CM | POA: Diagnosis not present

## 2024-04-19 DIAGNOSIS — Z9641 Presence of insulin pump (external) (internal): Secondary | ICD-10-CM | POA: Diagnosis not present

## 2024-04-19 DIAGNOSIS — E7800 Pure hypercholesterolemia, unspecified: Secondary | ICD-10-CM | POA: Diagnosis not present

## 2024-04-19 DIAGNOSIS — E559 Vitamin D deficiency, unspecified: Secondary | ICD-10-CM | POA: Diagnosis not present

## 2024-04-19 DIAGNOSIS — E1065 Type 1 diabetes mellitus with hyperglycemia: Secondary | ICD-10-CM | POA: Diagnosis not present

## 2024-04-19 DIAGNOSIS — I10 Essential (primary) hypertension: Secondary | ICD-10-CM | POA: Diagnosis not present

## 2024-04-19 DIAGNOSIS — E039 Hypothyroidism, unspecified: Secondary | ICD-10-CM | POA: Diagnosis not present

## 2024-04-21 ENCOUNTER — Encounter: Payer: Self-pay | Admitting: Podiatry

## 2024-04-21 NOTE — Progress Notes (Signed)
 Subjective:  Patient ID: Tom Baker, male    DOB: 1955/07/24,  MRN: [id number]  Tom DELENA Majer presents to clinic today for at risk foot care with history of diabetic neuropathy and callus(es) b/l feet and painful mycotic toenails that are difficult to trim. Painful toenails interfere with ambulation. Aggravating factors include wearing enclosed shoe gear. Pain is relieved with periodic professional debridement. Painful calluses are aggravated when weightbearing with and without shoegear. Pain is relieved with periodic professional debridement.  Chief Complaint  Patient presents with   Toe Pain    Diabetic foot care. Dr. Gerome is his PCP. Last visit was in July.  A1c is 7.7   New problem(s): None.   PCP is Gerome Brunet, DO.  No Known Allergies  Review of Systems: Negative except as noted in the HPI.  Objective: No changes noted in today's physical examination. There were no vitals filed for this visit. SHAMON COTHRAN is a pleasant 68 y.o. male WD, WN in NAD. AAO x 3.  Vascular status intact bilaterally and symetrically.  Neurological Examination: Pt has subjective symptoms of neuropathy. Protective sensation decreased with 10 gram monofilament b/l.  Dermatological Examination: Pedal skin with normal turgor, texture and tone b/l.  No open wounds. No interdigital macerations.   Toenails 1-5 b/l thick, discolored, elongated with subungual debris and pain on dorsal palpation.   Hyperkeratotic lesion(s) submet head 5 b/l.  No erythema, no edema, no drainage, no fluctuance.  Musculoskeletal Examination: Muscle strength 5/5 to all LE muscle groups of both feet with exception of dorsiflexors. Muscle strength 3/5 to all LE muscle groups of dorsiflexors b/l. Foot deformity with prominence sub 5th met cuboid joint. Hammertoe deformity noted 2-5 b/l.  Radiographs: None  Assessment/Plan: 1. Pain due to onychomycosis of toenails of both feet   2. Acquired hammertoes of both feet   3.  Foot deformity, bilateral   4. Callus   5. Type 1 diabetes mellitus with diabetic polyneuropathy (HCC)    Orders Placed This Encounter  Procedures   For home use only DME Other see comment    To [facility]:  Dispense 3 pair custom diabetic insoles. Offload submet head 5 and 5th met/cuboid joint b/l.    Length of Need:   12 Months  -Patient was evaluated today. All questions/concerns addressed on today's visit. -Order entered for three pair custom diabetic insoles for [facility]. -Continue foot and shoe inspections daily. Monitor blood glucose per PCP/Endocrinologist's recommendations. -Patient to continue soft, supportive shoe gear daily. -Toenails 1-5 b/l were debrided in length and girth with sterile nail nippers and dremel without iatrogenic bleeding.  -Callus(es) submet head 5 b/l pared utilizing sterile scalpel blade without complication or incident. Total number debrided =2. -Patient/POA to call should there be question/concern in the interim.   Return in about 3 months (around 07/14/2024).  Delon LITTIE Merlin, DPM      [place] LOCATION: [address] [place] [address]                   Office [phone]   [facility] LOCATION: [address] [place], [place] [address] Office [phone]

## 2024-04-25 ENCOUNTER — Ambulatory Visit

## 2024-04-25 NOTE — Progress Notes (Signed)
 Patient presents to the office today for diabetic shoe and insole measuring.  Patient was measured with brannock device to determine size and width for 1 pair of extra depth shoes and foam casted for 3 pair of insoles.   Documentation of medical necessity will be sent to patient's DTD patient has also taken copy and agrees to FU on ppw  Patient's diabetic provider: Bindual Balan   Shoes and insoles will be ordered at that time ppw is recvd and patient will be notified for an appointment for fitting when they arrive.   New balance [id number] and New bal. Grey if not avail  Anodyne inserts  Impression box held  Lolita Schultze

## 2024-04-30 DIAGNOSIS — Z23 Encounter for immunization: Secondary | ICD-10-CM | POA: Diagnosis not present

## 2024-05-10 ENCOUNTER — Other Ambulatory Visit: Payer: Self-pay | Admitting: Cardiovascular Disease

## 2024-05-10 DIAGNOSIS — E785 Hyperlipidemia, unspecified: Secondary | ICD-10-CM

## 2024-05-10 DIAGNOSIS — R519 Headache, unspecified: Secondary | ICD-10-CM | POA: Diagnosis not present

## 2024-05-23 ENCOUNTER — Other Ambulatory Visit: Payer: Self-pay

## 2024-05-23 ENCOUNTER — Emergency Department (HOSPITAL_BASED_OUTPATIENT_CLINIC_OR_DEPARTMENT_OTHER)

## 2024-05-23 ENCOUNTER — Encounter (HOSPITAL_BASED_OUTPATIENT_CLINIC_OR_DEPARTMENT_OTHER): Payer: Self-pay | Admitting: Emergency Medicine

## 2024-05-23 ENCOUNTER — Emergency Department (HOSPITAL_BASED_OUTPATIENT_CLINIC_OR_DEPARTMENT_OTHER)
Admission: EM | Admit: 2024-05-23 | Discharge: 2024-05-23 | Disposition: A | Discharge status: Home / Self Care | Attending: Emergency Medicine | Admitting: Emergency Medicine

## 2024-05-23 DIAGNOSIS — E109 Type 1 diabetes mellitus without complications: Secondary | ICD-10-CM | POA: Insufficient documentation

## 2024-05-23 DIAGNOSIS — Z79899 Other long term (current) drug therapy: Secondary | ICD-10-CM | POA: Insufficient documentation

## 2024-05-23 DIAGNOSIS — J329 Chronic sinusitis, unspecified: Secondary | ICD-10-CM | POA: Insufficient documentation

## 2024-05-23 DIAGNOSIS — Z7982 Long term (current) use of aspirin: Secondary | ICD-10-CM | POA: Insufficient documentation

## 2024-05-23 DIAGNOSIS — Z794 Long term (current) use of insulin: Secondary | ICD-10-CM | POA: Diagnosis not present

## 2024-05-23 DIAGNOSIS — R519 Headache, unspecified: Secondary | ICD-10-CM | POA: Diagnosis present

## 2024-05-23 LAB — RESP PANEL BY RT-PCR (RSV, FLU A&B, COVID)  RVPGX2
Influenza A by PCR: NEGATIVE
Influenza B by PCR: NEGATIVE
Resp Syncytial Virus by PCR: NEGATIVE
SARS Coronavirus 2 by RT PCR: NEGATIVE

## 2024-05-23 MED ORDER — AMOXICILLIN-POT CLAVULANATE 875-125 MG PO TABS
1.0000 | ORAL_TABLET | Freq: Once | ORAL | Status: AC
  Administered 2024-05-23: 1 via ORAL
  Filled 2024-05-23: qty 1

## 2024-05-23 MED ORDER — AMOXICILLIN-POT CLAVULANATE 875-125 MG PO TABS: 1.0000 | ORAL_TABLET | Freq: Two times a day (BID) | ORAL | 0 refills | Status: DC

## 2024-05-23 NOTE — ED Triage Notes (Signed)
 Pt presents with 2 week hx of headaches.  Pt reports seeing his PCP and being given prescriptions for his sinuses.  Reports he is a type 1 diabetic and blood sugar has been higher, (189) and that indicates to him that there is some type of infection.  Facial pressure off and on.  Headaches off and on. No n/v/d but no cough.  Blood pressure he reports as normal at home and at his last visit with PCP

## 2024-05-23 NOTE — ED Provider Notes (Signed)
 [place] [facility] Provider Note   CSN: [id number] Arrival date & time: 05/23/24  2105     Patient presents with: Headache   Tom Baker is a 68 y.o. male.   68 year old male presents with his wife for concern of headaches that has been ongoing for the past couple weeks.  He was seen by his primary care doctor about a week and a half ago and was started on nasal spray which he states has helped some but he still has some sinus congestion.  He has noticed that his blood sugars have been running somewhat higher which makes him worried for an infection.  Denies any history of headaches.  No history of migraine headaches.  This is not the worst headache of his life and was not severe in onset.  The history is provided by the patient. No language interpreter was used.       Prior to Admission medications   Medication Sig Start Date End Date Taking? Authorizing Provider  amLODipine  (NORVASC ) 5 MG tablet Take 1 tablet (5 mg total) by mouth daily. 12/17/15   Levern Beckey HERO, MD  aspirin  EC 81 MG tablet Take 81 mg by mouth at bedtime.    [provider]  calcium  carbonate (TUMS - DOSED IN MG ELEMENTAL CALCIUM ) 500 MG chewable tablet Chew 2 tablets by mouth daily as needed for indigestion or heartburn.    [provider]  Cholecalciferol  125 MCG (5000 UT) TABS Take 1 tablet (5,000 Units total) by mouth daily. 12/18/20   Deward Eck, PA-C  insulin  aspart (NOVOLOG ) 100 UNIT/ML injection Inject into the skin continuous. Use in insulin  pump    [provider]  Insulin  Infusion Pump DEVI 50 Units by Does not apply route continuous. Novolog  Insulin     [provider]  Lancets (ONETOUCH ULTRASOFT) lancets 1 each by Other route 6 (six) times daily. 10/10/15   [provider]  levothyroxine  (SYNTHROID , LEVOTHROID) 125 MCG tablet Take 125 mcg by mouth daily.    [provider]  losartan  (COZAAR ) 100 MG tablet Take  100 mg by mouth daily.    [provider]  [facility] VERIO test strip 1 each by Other route 6 (six) times daily. 10/14/20   [provider]  oxyCODONE -acetaminophen  (PERCOCET/ROXICET) 5-325 MG tablet Take 1-2 tablets by mouth every 6 (six) hours as needed for severe pain. 09/29/21   Colon Shove, MD  Polyethylene Glycol 3350  (MIRALAX  PO) Take 17 g by mouth every other day. At night    [provider]  rosuvastatin  (CRESTOR ) 10 MG tablet TAKE 1 TABLET(10 MG) BY MOUTH DAILY 05/11/24   Raford Riggs, MD  tadalafil  (CIALIS ) 5 MG tablet Take 5 mg by mouth daily.    [provider]  Tetrahydrozoline HCl (VISINE OP) Place 1-2 drops into both eyes 2 (two) times daily.    [provider]  timolol  (BETIMOL ) 0.5 % ophthalmic solution Place 1 drop into the left eye 2 (two) times daily.    [provider]    Allergies: Patient has no known allergies.    Review of Systems  Constitutional:  Negative for chills and fever.  HENT:  Positive for congestion and sinus pressure.   Eyes:  Negative for photophobia and visual disturbance.  Respiratory:  Negative for shortness of breath.   Neurological:  Positive for headaches.  All other systems reviewed and are negative.   Updated Vital Signs BP 130/75 (BP Location: Left Arm)  Pulse (!) 105   Temp 98.5 F (36.9 C)   Resp 20   SpO2 100%   Physical Exam Vitals and nursing note reviewed.  Constitutional:      General: He is not in acute distress.    Appearance: Normal appearance. He is not ill-appearing.  HENT:     Head: Normocephalic and atraumatic.     Nose: Nose normal.  Eyes:     Conjunctiva/sclera: Conjunctivae normal.  Cardiovascular:     Rate and Rhythm: Normal rate and regular rhythm.  Pulmonary:     Effort: Pulmonary effort is normal. No respiratory distress.  Musculoskeletal:        General: No deformity. Normal range of motion.     Cervical back: Normal range of motion.  Skin:     Findings: No rash.  Neurological:     General: No focal deficit present.     Mental Status: He is alert and oriented to person, place, and time. Mental status is at baseline.     Cranial Nerves: No cranial nerve deficit.     Motor: No weakness.     Comments: Good range of motion upper and lower extremities.  Cranial nerves III through XII grossly intact.  Normal speech.  No facial droop.  No pronator drift.     (all labs ordered are listed, but only abnormal results are displayed) Labs Reviewed  RESP PANEL BY RT-PCR (RSV, FLU A&B, COVID)  [id number]    EKG: None  Radiology: No results found.   Procedures   Medications Ordered in the ED - No data to display                                  Medical Decision Making Amount and/or Complexity of Data Reviewed Radiology: ordered.  Risk Prescription drug management.   68 year old male presents today for concern of sinus pressure, headache.  Not history of headaches.  But does endorse sinus pressure that somewhat improved after nasal spray that was started by his PCP. Given his age and new onset headache will obtain CT head. If this is negative we will treat for sinusitis given duration with antibiotic.  CT without acute intracranial process however it does show a mass arising from the ethmoid air cells.  Discussed with attending.  This can be followed up outpatient.  Patient notified of this.  ENT referral given.  Will treat with Augmentin in the meantime.  Discharged in stable condition.  Final diagnoses:  Sinusitis, unspecified chronicity, unspecified location    ED Discharge Orders          Ordered    amoxicillin-clavulanate (AUGMENTIN) 875-125 MG tablet  Every 12 hours        05/23/24 2322               Hildegard Loge, PA-C 05/23/24 2326    Lenor Hollering, MD 05/23/24 2328

## 2024-05-23 NOTE — Discharge Instructions (Addendum)
 Your exam was reassuring.  CT scan showed evidence of a small mass around the sinus.  You will need to follow-up with an ear nose throat doctor.  Information listed above regarding this clinic.  Take antibiotic as discussed.  Perform sinus rinse.  Return for any concerning symptoms.  CT results listed below.  IMPRESSION:  1. 1.4 x 1.9 x 2.8 cm hyperdense mass arising from the posterior right ethmoid  air cell, extending into the expanded, fluid-filled sphenoid sinus with  circumferential wall thickening (chronic process). Differential considerations  include a sinonasal polyp, less likely an inverted papilloma or allergic fungal  rhinosinusitis. Otolaryngology consultation is recommended further management.

## 2024-05-24 DIAGNOSIS — R0981 Nasal congestion: Secondary | ICD-10-CM | POA: Diagnosis not present

## 2024-05-24 DIAGNOSIS — R9389 Abnormal findings on diagnostic imaging of other specified body structures: Secondary | ICD-10-CM | POA: Diagnosis not present

## 2024-06-01 ENCOUNTER — Encounter (INDEPENDENT_AMBULATORY_CARE_PROVIDER_SITE_OTHER): Payer: Self-pay

## 2024-06-07 ENCOUNTER — Other Ambulatory Visit: Payer: Self-pay

## 2024-06-07 DIAGNOSIS — E785 Hyperlipidemia, unspecified: Secondary | ICD-10-CM

## 2024-06-08 ENCOUNTER — Other Ambulatory Visit: Payer: Self-pay | Admitting: Cardiovascular Disease

## 2024-06-08 DIAGNOSIS — E785 Hyperlipidemia, unspecified: Secondary | ICD-10-CM

## 2024-06-11 ENCOUNTER — Other Ambulatory Visit: Payer: Self-pay | Admitting: Cardiovascular Disease

## 2024-06-11 DIAGNOSIS — E785 Hyperlipidemia, unspecified: Secondary | ICD-10-CM

## 2024-06-12 ENCOUNTER — Telehealth: Payer: Self-pay | Admitting: Cardiovascular Disease

## 2024-06-12 DIAGNOSIS — E785 Hyperlipidemia, unspecified: Secondary | ICD-10-CM

## 2024-06-12 MED ORDER — ROSUVASTATIN CALCIUM 10 MG PO TABS: 10.0000 mg | ORAL_TABLET | Freq: Every day | ORAL | 0 refills | Status: AC

## 2024-06-12 NOTE — Telephone Encounter (Signed)
 No refill indicated.  Follow up appointment scheduled.   Will close encounter.

## 2024-06-12 NOTE — Telephone Encounter (Signed)
 Pt scheduled to see Dr. Raford 09/26/23.  Refill sent.

## 2024-06-12 NOTE — Telephone Encounter (Signed)
*  STAT* If patient is at the pharmacy, call can be transferred to refill team.   1. Which medications need to be refilled? (please list name of each medication and dose if known)   rosuvastatin  (CRESTOR ) 10 MG tablet    2. Would you like to learn more about the convenience, safety, & potential cost savings by using the [facility]? no   3. Are you open to using the [facility] (Type [facility]. no   4. Which pharmacy/location (including street and city if local pharmacy) is medication to be sent to? [id number] - [place], [place] - [address] AT [address]      5. Do they need a 30 day or 90 day supply? 90 days

## 2024-06-14 DIAGNOSIS — E113393 Type 2 diabetes mellitus with moderate nonproliferative diabetic retinopathy without macular edema, bilateral: Secondary | ICD-10-CM | POA: Diagnosis not present

## 2024-06-14 DIAGNOSIS — H18413 Arcus senilis, bilateral: Secondary | ICD-10-CM | POA: Diagnosis not present

## 2024-06-14 DIAGNOSIS — H401121 Primary open-angle glaucoma, left eye, mild stage: Secondary | ICD-10-CM | POA: Diagnosis not present

## 2024-06-14 DIAGNOSIS — H26493 Other secondary cataract, bilateral: Secondary | ICD-10-CM | POA: Diagnosis not present

## 2024-06-14 DIAGNOSIS — H26491 Other secondary cataract, right eye: Secondary | ICD-10-CM | POA: Diagnosis not present

## 2024-06-14 DIAGNOSIS — Z961 Presence of intraocular lens: Secondary | ICD-10-CM | POA: Diagnosis not present

## 2024-06-14 DIAGNOSIS — H35371 Puckering of macula, right eye: Secondary | ICD-10-CM | POA: Diagnosis not present

## 2024-06-18 ENCOUNTER — Ambulatory Visit: Admitting: Podiatry

## 2024-06-18 ENCOUNTER — Encounter: Payer: Self-pay | Admitting: Podiatry

## 2024-06-18 DIAGNOSIS — E1042 Type 1 diabetes mellitus with diabetic polyneuropathy: Secondary | ICD-10-CM

## 2024-06-18 DIAGNOSIS — B351 Tinea unguium: Secondary | ICD-10-CM

## 2024-06-18 DIAGNOSIS — M79674 Pain in right toe(s): Secondary | ICD-10-CM

## 2024-06-18 DIAGNOSIS — L84 Corns and callosities: Secondary | ICD-10-CM

## 2024-06-18 DIAGNOSIS — M79675 Pain in left toe(s): Secondary | ICD-10-CM

## 2024-06-19 ENCOUNTER — Telehealth: Payer: Self-pay | Admitting: Podiatry

## 2024-06-19 NOTE — Telephone Encounter (Signed)
 Called patient and LVM stating that his diabetic shoes are in at the [facility] office. Someone should call on tomorrow to schedule a dispense date for him.

## 2024-06-21 DIAGNOSIS — H26491 Other secondary cataract, right eye: Secondary | ICD-10-CM | POA: Diagnosis not present

## 2024-06-24 NOTE — Progress Notes (Signed)
"  °  Subjective:  Patient ID: Tom Baker, male    DOB: [birth year],  MRN: [id number]  Tom DELENA Grattan presents to clinic today for at risk foot care with history of diabetic neuropathy and callus(es) of both feet and painful mycotic toenails that are difficult to trim. Painful toenails interfere with ambulation. Aggravating factors include wearing enclosed shoe gear. Pain is relieved with periodic professional debridement. Painful calluses are aggravated when weightbearing with and without shoegear. Pain is relieved with periodic professional debridement.  Chief Complaint  Patient presents with   Diabetes    A1c 6.5. He saw Dr. Gerome x 3 weeks ago   New problem(s): None.   PCP is Gerome Brunet, DO.  Allergies[1]  Review of Systems: Negative except as noted in the HPI.  Objective: No changes noted in today's physical examination. There were no vitals filed for this visit. LANDIN TALLON is a pleasant 68 y.o. male WD, WN in NAD. AAO x 3.  Vascular status intact bilaterally and symetrically.  Neurological Examination: Pt has subjective symptoms of neuropathy. Protective sensation decreased with 10 gram monofilament b/l.  Dermatological Examination: Pedal skin with normal turgor, texture and tone b/l.  No open wounds. No interdigital macerations.   Toenails 1-5 b/l thick, discolored, elongated with subungual debris and pain on dorsal palpation.   Hyperkeratotic lesion(s) submet head 5 b/l.  No erythema, no edema, no drainage, no fluctuance.  Musculoskeletal Examination: Muscle strength 5/5 to all LE muscle groups of both feet with exception of dorsiflexors. Muscle strength 3/5 to all LE muscle groups of dorsiflexors b/l. Foot deformity with prominence sub 5th met cuboid joint. Hammertoe deformity noted 2-5 b/l.  Radiographs: None  Assessment/Plan: 1. Pain due to onychomycosis of toenails of both feet   2. Type 1 diabetes mellitus with diabetic polyneuropathy [facility])   Consent given  for treatment. Will check with Orthotics and Prosthetics department for status of diabetic shoes/inserts. Will update Patient examined. All patient's and/or POA's questions/concerns addressed on today's visit. Mycotic toenails 1-5 b/l  debrided in length and girth without incident. Callus(es) submet head 5 b/l pared with sharp debridement without incident. Continue foot and shoe inspections daily. Monitor blood glucose per PCP/Endocrinologist's recommendations.Continue soft, supportive shoe gear daily. Report any pedal injuries to medical professional. Call office if there are any quesitons/concerns. Return in about 3 months (around 09/16/2024).  Delon LITTIE Merlin, DPM      [facility] LOCATION: [address] [place] [address]                   Office [phone]   [facility] LOCATION: [address] [place], [place] [address] Office [phone]     [1] No Known Allergies  "

## 2024-06-25 ENCOUNTER — Ambulatory Visit (INDEPENDENT_AMBULATORY_CARE_PROVIDER_SITE_OTHER): Admitting: Otolaryngology

## 2024-06-25 ENCOUNTER — Encounter (INDEPENDENT_AMBULATORY_CARE_PROVIDER_SITE_OTHER): Payer: Self-pay | Admitting: Otolaryngology

## 2024-06-25 VITALS — BP 110/66 | HR 90 | Ht 75.0 in | Wt 197.0 lb

## 2024-06-25 DIAGNOSIS — J328 Other chronic sinusitis: Secondary | ICD-10-CM | POA: Diagnosis not present

## 2024-06-25 DIAGNOSIS — R0982 Postnasal drip: Secondary | ICD-10-CM

## 2024-06-25 DIAGNOSIS — J343 Hypertrophy of nasal turbinates: Secondary | ICD-10-CM

## 2024-06-25 DIAGNOSIS — R0981 Nasal congestion: Secondary | ICD-10-CM

## 2024-06-25 MED ORDER — AMOXICILLIN-POT CLAVULANATE 875-125 MG PO TABS: 1.0000 | ORAL_TABLET | Freq: Two times a day (BID) | ORAL | 0 refills | Status: AC

## 2024-06-25 NOTE — Patient Instructions (Addendum)
 I have ordered an imaging study for you to complete prior to your next visit. Please call [facility] Scheduling at [phone] to schedule your imaging if you have not received a call within 24 hours. If you are unable to complete your imaging study prior to your next scheduled visit please call our office to let us  know.   Stop rinsing 3 days before the CT scan --- schedule the CT scan in about 2-3 weeks to make the antibiotics work  Take Augmentin  875 mg by mouth (PO) twice daily for 10 days; take with food, take probiotic or yogurt with it

## 2024-06-25 NOTE — Progress Notes (Signed)
 Dear Dr. Gerome, Here is my assessment for our mutual patient, Tom Baker. Thank you for allowing me the opportunity to care for your patient. Please do not hesitate to contact me should you have any other questions. Sincerely, Dr. Eldora Blanch  Otolaryngology Clinic Note  HISTORY: Tom Baker is a 68 y.o. male referred for nasal issues  Initial visit (06/2024): Discussed the use of AI scribe software for clinical note transcription with the patient, who gave verbal consent to proceed.  History of Present Illness Tom Baker is a 67 year old male with type 1 diabetes who presents for evaluation of chronic sinusitis with possible fungal mass and recent acute infection.  In November 2025, he developed intermittent severe headaches localized to the bilateral forehead and sometimes vertex. He used a nasal spray without relief, then presented to the emergency department where he was treated with Augmentin  and nasal saline rinses.  About one and a half weeks after completing antibiotics, his headaches resolved and have not recurred. He currently has no headache, facial pain, or pressure. He notes occasional postnasal drip, no congestion or rhinorrhea, and intermittent left-predominant epistaxis triggered by forceful nose blowing, which he associates with nasal dryness. He has not used nasal medications or rinses for the past week and a half.  He has no prior headaches, sinus infections, allergies, or sinus surgery. He reports very dry air at home due to a gas furnace.  No previous sinonasal surgery. No prior allergy testing He is currently using no nasal medications.  Not immune compromised.  GLP-1: no AP/AC: ASA 81  Tobacco: no  PMHx: HTN, Hypothyroidism, DM, remote h/o A-flutter  RADIOGRAPHIC EVALUATION AND INDEPENDENT REVIEW OF OTHER RECORDS:: [facility] 05/23/2024 independently interpreted with respsect to sinuses: right concha, right sphenoid MPT and some erosion of intersinus septum  into left sphenoid; no skull base erosion; right ethmoid MPT -- some dual density(?) - fungal ball? Dr. Gerome Referral notes reviewed and uploaded or available in chart in media tab (05/24/2024): ER follow up -- headache, dx with sinusitis with mass(?); given augmentin ; Rx: ref to ENT ED notes reviewed (Dr. Lenor) CBC w/diff 02/15/2022: WBC 12.6, Eos 0 Past Medical History:  Diagnosis Date   BPH (benign prostatic hyperplasia)    Complication of anesthesia    hard to urinate after surgery ankle surgery 2005 [facility]   Diabetes mellitus type 1 (HCC)    GERD (gastroesophageal reflux disease)    Glaucoma    left eye   History of blood transfusion 12/2020   Hyperlipidemia    Hypertension    Hypothyroid    Pericardial effusion 2017   Pneumonia 2017   Post-nasal drip 08/02/2016   Runny nose    occ at times pe pt   Wears glasses    Past Surgical History:  Procedure Laterality Date   ANKLE SURGERY Right 2010   [facility] plate and 6 screws still in   ANTERIOR CERVICAL DECOMPRESSION/DISCECTOMY FUSION 4 LEVELS N/A 09/29/2021   Procedure: Cervical Three-Four,  Cervical Four-Five,  Cervical Five-Six,  Cervical Six--Seven  Anterior Cervical Discectomy Fusion  with possible Corpectomy-Cervical Five;  Surgeon: Colon Shove, MD;  Location: [facility] OR;  Service: Neurosurgery;  Laterality: N/A;  3C/RM 21   CHOLECYSTECTOMY  1995   laparoscopic   CYSTOSCOPY WITH INSERTION OF UROLIFT N/A 09/16/2020   Procedure: CYSTOSCOPY WITH INSERTION OF UROLIFT; URETHRAL DILATION;  Surgeon: Nieves Cough, MD;  Location: [facility];  Service: Urology;  Laterality: N/A;   EYE SURGERY Left 2003  lens replacement for cataract   INTRAMEDULLARY (IM) NAIL INTERTROCHANTERIC Right 12/13/2020   Procedure: INTRAMEDULLARY (IM) NAIL INTERTROCHANTRIC;  Surgeon: Celena Sharper, MD;  Location: [facility] OR;  Service: Orthopedics;  Laterality: Right;   KNEE SURGERY Right 1988   arthroscopic   radioactive iodine  to thryoid   2000   TOE SURGERY Left 1972   great toe   VITRECTOMY  1999   Family History  Problem Relation Age of Onset   Bone cancer Father    Social History   Tobacco Use   Smoking status: Never   Smokeless tobacco: Never  Substance Use Topics   Alcohol  use: No    Alcohol /week: 0.0 standard drinks of alcohol     Comment: last used 1992   Allergies[1] Current Outpatient Medications  Medication Sig Dispense Refill   amLODipine  (NORVASC ) 5 MG tablet Take 1 tablet (5 mg total) by mouth daily. 30 tablet 0   aspirin  EC 81 MG tablet Take 81 mg by mouth at bedtime.     calcium  carbonate (TUMS - DOSED IN MG ELEMENTAL CALCIUM ) 500 MG chewable tablet Chew 2 tablets by mouth daily as needed for indigestion or heartburn.     insulin  aspart (NOVOLOG ) 100 UNIT/ML injection Inject into the skin continuous. Use in insulin  pump     Insulin  Infusion Pump DEVI 50 Units by Does not apply route continuous. Novolog  Insulin      Lancets (ONETOUCH ULTRASOFT) lancets 1 each by Other route 6 (six) times daily.  0   levothyroxine  (SYNTHROID , LEVOTHROID) 125 MCG tablet Take 125 mcg by mouth daily.     losartan  (COZAAR ) 100 MG tablet Take 100 mg by mouth daily.     ONETOUCH VERIO test strip 1 each by Other route 6 (six) times daily.     Polyethylene Glycol 3350  (MIRALAX  PO) Take 17 g by mouth every other day. At night     rosuvastatin  (CRESTOR ) 10 MG tablet Take 1 tablet (10 mg total) by mouth daily. 90 tablet 0   tadalafil  (CIALIS ) 5 MG tablet Take 5 mg by mouth daily.     Tetrahydrozoline HCl (VISINE OP) Place 1-2 drops into both eyes 2 (two) times daily.     timolol  (BETIMOL ) 0.5 % ophthalmic solution Place 1 drop into the left eye 2 (two) times daily.     amoxicillin -clavulanate (AUGMENTIN ) 875-125 MG tablet Take 1 tablet by mouth every 12 (twelve) hours for 10 days. 20 tablet 0   Cholecalciferol  125 MCG (5000 UT) TABS Take 1 tablet (5,000 Units total) by mouth daily. (Patient not taking: Reported on 06/25/2024) 30  tablet 6   oxyCODONE -acetaminophen  (PERCOCET/ROXICET) 5-325 MG tablet Take 1-2 tablets by mouth every 6 (six) hours as needed for severe pain. (Patient not taking: Reported on 06/25/2024) 20 tablet 0   No current facility-administered medications for this visit.   BP 110/66 (BP Location: Left Arm, Patient Position: Sitting, Cuff Size: Large)   Pulse 90   Ht 6' 3 (1.905 m)   Wt 197 lb (89.4 kg)   SpO2 98%   BMI 24.62 kg/m   PHYSICAL EXAM:  BP 110/66 (BP Location: Left Arm, Patient Position: Sitting, Cuff Size: Large)   Pulse 90   Ht 6' 3 (1.905 m)   Wt 197 lb (89.4 kg)   SpO2 98%   BMI 24.62 kg/m    Salient findings:  CN II-XII intact Bilateral EAC clear and TM intact with well pneumatized middle ear spaces Nose: Anterior rhinoscopy reveals intact septum, bilateral inferior turbinats with modest  hypertrophy.  Nasal endoscopy was indicated to better evaluate the nose and paranasal sinuses, given the patient's history and exam findings, and is detailed below. No lesions of oral cavity/oropharynx; palatal sensation intact No obviously palpable neck masses/lymphadenopathy/thyromegaly No respiratory distress or stridor   PROCEDURE:  Prior to initiating any procedures, risks/benefits/alternatives were explained to the patient and verbal consent obtained. Diagnostic Nasal Endoscopy Pre-procedure diagnosis: Concern for sinusitis, right nasal mass Post-procedure diagnosis: same Indication: See pre-procedure diagnosis and physical exam above Complications: None apparent EBL: 0 mL Anesthesia: Lidocaine  4% and topical decongestant was topically sprayed in each nasal cavity  Description of Procedure:  Patient was identified. A rigid 30 degree endoscope was utilized to evaluate the sinonasal cavities, mucosa, sinus ostia and turbinates and septum.  Overall, signs of mucosal inflammation are noted.  No polyps, or masses noted.   Right Middle meatus: clear Right SE Recess: purulence but no  mass appreciated Left MM: clear Left SE Recess: clear  CPT CODE -- 31231 - Mod 25   ASSESSMENT:  68 y.o. with T1DM now with:  1. Other chronic sinusitis   2. Hypertrophy of both inferior nasal turbinates   3. Nasal congestion   4. Post-nasal drip    Noted purulence SE recess. On prior CT appears to be fungal ball(?) as there is some dual density. Thankfully not having vertex headaches but did have them a few weeks ago, and we discussed this could be as a result of his right sinusitis.  PLAN: We've discussed issues and options today.  We reviewed the nasal endoscopy images together.  The risks, benefits and alternatives were discussed and questions answered.  He has elected to proceed with:  1) Augmentin  BID x10d 2) Daily nasal rinses 3) Flonase  BID 4) Post-treatment CT 5) Given his DM and prior sx, may consider FESS for clearance and dx Follow-up in ~4 weeks -- sooner as necessary.  See below regarding exact medications prescribed this encounter including dosages and route: Meds ordered this encounter  Medications   amoxicillin -clavulanate (AUGMENTIN ) 875-125 MG tablet    Sig: Take 1 tablet by mouth every 12 (twelve) hours for 10 days.    Dispense:  20 tablet    Refill:  0     Thank you for allowing me the opportunity to care for your patient. Please do not hesitate to contact me should you have any other questions.  Sincerely, Eldora Blanch, MD Otolaryngologist (ENT), [facility] Phone: [phone] Fax: [phone]  MDM:  Level 4: [phone] Complexity/Problems addressed: mod - chronic problems Data complexity: high - independent interpretation of CT; review of notes, albs - Morbidity: mod  - Prescription Drug prescribed or managed: y  06/25/2024, 2:31 PM      [1] No Known Allergies

## 2024-06-26 ENCOUNTER — Ambulatory Visit (INDEPENDENT_AMBULATORY_CARE_PROVIDER_SITE_OTHER): Admitting: Podiatrist

## 2024-06-26 DIAGNOSIS — L84 Corns and callosities: Secondary | ICD-10-CM

## 2024-06-26 DIAGNOSIS — M21962 Unspecified acquired deformity of left lower leg: Secondary | ICD-10-CM | POA: Diagnosis not present

## 2024-06-26 DIAGNOSIS — E1042 Type 1 diabetes mellitus with diabetic polyneuropathy: Secondary | ICD-10-CM

## 2024-06-26 DIAGNOSIS — M2041 Other hammer toe(s) (acquired), right foot: Secondary | ICD-10-CM | POA: Diagnosis not present

## 2024-06-26 DIAGNOSIS — M2042 Other hammer toe(s) (acquired), left foot: Secondary | ICD-10-CM | POA: Diagnosis not present

## 2024-06-26 DIAGNOSIS — M21961 Unspecified acquired deformity of right lower leg: Secondary | ICD-10-CM

## 2024-06-26 NOTE — Progress Notes (Signed)
" ° °  The patient presented to the office to day to pick up diabetic shoes and 3 pr diabetic custom inserts [id number].  1 pr of  inserts were put in the shoes and the shoes were fitted to the patient.   The foot ortheses offered full contact with plantar surface and contoured the arch well.   The shoes fit well with no heel slippage or areas of pressure concern.   Instructions for break in and wear were dispensed as well as instructions for changing out the diabetic insoles every 4 months.  The  delivery documentation and break in instruction forms were signed and a copy of the paperwork was given to the patient.    Patient advised to contact us  if any problems arise.  Patient also advised on how to report any issues  "

## 2024-07-12 ENCOUNTER — Telehealth (INDEPENDENT_AMBULATORY_CARE_PROVIDER_SITE_OTHER): Payer: Self-pay

## 2024-07-12 NOTE — Telephone Encounter (Signed)
 Patient called to see if he can use his Flonase  after his CT scan. I explained to him that he could. Patient understood.

## 2024-07-16 ENCOUNTER — Ambulatory Visit (HOSPITAL_COMMUNITY)
Admission: RE | Admit: 2024-07-16 | Discharge: 2024-07-16 | Disposition: A | Discharge status: Home / Self Care | Source: Ambulatory Visit | Attending: Otolaryngology | Admitting: Otolaryngology

## 2024-07-16 DIAGNOSIS — J328 Other chronic sinusitis: Secondary | ICD-10-CM | POA: Diagnosis present

## 2024-07-23 ENCOUNTER — Ambulatory Visit (INDEPENDENT_AMBULATORY_CARE_PROVIDER_SITE_OTHER): Admitting: Otolaryngology

## 2024-07-23 ENCOUNTER — Encounter (INDEPENDENT_AMBULATORY_CARE_PROVIDER_SITE_OTHER): Payer: Self-pay | Admitting: Otolaryngology

## 2024-07-23 VITALS — BP 127/68 | HR 94 | Ht 75.0 in | Wt 197.0 lb

## 2024-07-23 DIAGNOSIS — J343 Hypertrophy of nasal turbinates: Secondary | ICD-10-CM | POA: Diagnosis not present

## 2024-07-23 DIAGNOSIS — J328 Other chronic sinusitis: Secondary | ICD-10-CM | POA: Diagnosis not present

## 2024-07-23 DIAGNOSIS — R0981 Nasal congestion: Secondary | ICD-10-CM

## 2024-07-23 DIAGNOSIS — B479 Mycetoma, unspecified: Secondary | ICD-10-CM | POA: Diagnosis not present

## 2024-07-23 DIAGNOSIS — R0982 Postnasal drip: Secondary | ICD-10-CM | POA: Diagnosis not present

## 2024-07-23 NOTE — Progress Notes (Signed)
 Dear Dr. Gerome, Here is my assessment for our mutual patient, Tom Baker. Thank you for allowing me the opportunity to care for your patient. Please do not hesitate to contact me should you have any other questions. Sincerely, Dr. Eldora Blanch  Otolaryngology Clinic Note  HISTORY: Tom Baker is a 69 y.o. male referred for nasal issues  Initial visit (06/2024): Discussed the use of AI scribe software for clinical note transcription with the patient, who gave verbal consent to proceed.  History of Present Illness Tom Baker is a 69 year old male with type 1 diabetes who presents for evaluation of chronic sinusitis with possible fungal mass and recent acute infection.  In November 2025, he developed intermittent severe headaches localized to the bilateral forehead and sometimes vertex. He used a nasal spray without relief, then presented to the emergency department where he was treated with Augmentin  and nasal saline rinses.  About one and a half weeks after completing antibiotics, his headaches resolved and have not recurred. He currently has no headache, facial pain, or pressure. He notes occasional postnasal drip, no congestion or rhinorrhea, and intermittent left-predominant epistaxis triggered by forceful nose blowing, which he associates with nasal dryness. He has not used nasal medications or rinses for the past week and a half.  He has no prior headaches, sinus infections, allergies, or sinus surgery. He reports very dry air at home due to a gas furnace.  No previous sinonasal surgery. No prior allergy testing He is currently using no nasal medications.  Not immune compromised. --------------------------------------------------------- 07/23/2024 Returns for follow up with CT. Finished abx. Still doing the rinses. No significant change. No headaches. No history of frequent sinus infections. He is on ASA 81  GLP-1: no AP/AC: ASA 81  Tobacco: no  PMHx: HTN, Hypothyroidism,  DM, remote h/o A-flutter  RADIOGRAPHIC EVALUATION AND INDEPENDENT REVIEW OF OTHER RECORDS:: [facility] 05/23/2024 independently interpreted with respsect to sinuses: right concha, right sphenoid MPT and some erosion of intersinus septum into left sphenoid; no skull base erosion; right ethmoid MPT -- some dual density(?) - fungal ball? Dr. Gerome Referral notes reviewed and uploaded or available in chart in media tab (05/24/2024): ER follow up -- headache, dx with sinusitis with mass(?); given augmentin ; Rx: ref to ENT ED notes reviewed (Dr. Lenor) CBC w/diff 02/15/2022: WBC 12.6, Eos 0 CT Sinus 07/16/2024 independently interpreted: noted right concha, b/l sphenoid MPT with likely dual dense opacification with some erosion of intersinus septum into left sphenoid; no skull base erosion; right ethmoid MPT -- some dual density(?) - fungal ball?; some posterior right ethmoid sinus as well  Past Medical History:  Diagnosis Date   BPH (benign prostatic hyperplasia)    Complication of anesthesia    hard to urinate after surgery ankle surgery 2005 [facility]   Diabetes mellitus type 1 (HCC)    GERD (gastroesophageal reflux disease)    Glaucoma    left eye   History of blood transfusion 12/2020   Hyperlipidemia    Hypertension    Hypothyroid    Pericardial effusion 2017   Pneumonia 2017   Post-nasal drip 08/02/2016   Runny nose    occ at times pe pt   Wears glasses    Past Surgical History:  Procedure Laterality Date   ANKLE SURGERY Right 2010   [facility] plate and 6 screws still in   ANTERIOR CERVICAL DECOMPRESSION/DISCECTOMY FUSION 4 LEVELS N/A 09/29/2021   Procedure: Cervical Three-Four,  Cervical Four-Five,  Cervical Five-Six,  Cervical Six--Seven  Anterior Cervical Discectomy Fusion  with possible Corpectomy-Cervical Five;  Surgeon: Colon Shove, MD;  Location[facility] OR;  Service: Neurosurgery;  Laterality: N/A;  3C/RM 21   CHOLECYSTECTOMY  1995   laparoscopic   CYSTOSCOPY WITH INSERTION OF UROLIFT  N/A 09/16/2020   Procedure: CYSTOSCOPY WITH INSERTION OF UROLIFT; URETHRAL DILATION;  Surgeon: Nieves Cough, MD;  Location: [facility];  Service: Urology;  Laterality: N/A;   EYE SURGERY Left 2003   lens replacement for cataract   INTRAMEDULLARY (IM) NAIL INTERTROCHANTERIC Right 12/13/2020   Procedure: INTRAMEDULLARY (IM) NAIL INTERTROCHANTRIC;  Surgeon: Celena Sharper, MD;  Location: [facility] OR;  Service: Orthopedics;  Laterality: Right;   KNEE SURGERY Right 1988   arthroscopic   radioactive iodine  to thryoid  2000   TOE SURGERY Left 1972   great toe   VITRECTOMY  1999   Family History  Problem Relation Age of Onset   Bone cancer Father    Social History   Tobacco Use   Smoking status: Never   Smokeless tobacco: Never  Substance Use Topics   Alcohol  use: No    Alcohol /week: 0.0 standard drinks of alcohol     Comment: last used 1992   Allergies[1] Current Outpatient Medications  Medication Sig Dispense Refill   amLODipine  (NORVASC ) 5 MG tablet Take 1 tablet (5 mg total) by mouth daily. 30 tablet 0   aspirin  EC 81 MG tablet Take 81 mg by mouth at bedtime.     calcium  carbonate (TUMS - DOSED IN MG ELEMENTAL CALCIUM ) 500 MG chewable tablet Chew 2 tablets by mouth daily as needed for indigestion or heartburn.     insulin  aspart (NOVOLOG ) 100 UNIT/ML injection Inject into the skin continuous. Use in insulin  pump     Insulin  Infusion Pump DEVI 50 Units by Does not apply route continuous. Novolog  Insulin      Lancets (ONETOUCH ULTRASOFT) lancets 1 each by Other route 6 (six) times daily.  0   levothyroxine  (SYNTHROID , LEVOTHROID) 125 MCG tablet Take 125 mcg by mouth daily.     losartan  (COZAAR ) 100 MG tablet Take 100 mg by mouth daily.     ONETOUCH VERIO test strip 1 each by Other route 6 (six) times daily.     Polyethylene Glycol 3350  (MIRALAX  PO) Take 17 g by mouth every other day. At night     rosuvastatin  (CRESTOR ) 10 MG tablet Take 1 tablet (10 mg total) by mouth  daily. 90 tablet 0   tadalafil  (CIALIS ) 5 MG tablet Take 5 mg by mouth daily.     Tetrahydrozoline HCl (VISINE OP) Place 1-2 drops into both eyes 2 (two) times daily.     timolol  (BETIMOL ) 0.5 % ophthalmic solution Place 1 drop into the left eye 2 (two) times daily.     Cholecalciferol  125 MCG (5000 UT) TABS Take 1 tablet (5,000 Units total) by mouth daily. (Patient not taking: Reported on 07/23/2024) 30 tablet 6   oxyCODONE -acetaminophen  (PERCOCET/ROXICET) 5-325 MG tablet Take 1-2 tablets by mouth every 6 (six) hours as needed for severe pain. (Patient not taking: Reported on 07/23/2024) 20 tablet 0   No current facility-administered medications for this visit.   BP 127/68 (BP Location: Left Arm, Patient Position: Sitting, Cuff Size: Large)   Pulse 94   Ht 6' 3 (1.905 m)   Wt 197 lb (89.4 kg)   SpO2 96%   BMI 24.62 kg/m   PHYSICAL EXAM:  BP 127/68 (BP Location: Left Arm, Patient Position: Sitting, Cuff Size: Large)  Pulse 94   Ht 6' 3 (1.905 m)   Wt 197 lb (89.4 kg)   SpO2 96%   BMI 24.62 kg/m    Salient findings:  CN II-XII intact Nose: Anterior rhinoscopy reveals intact septum, bilateral inferior turbinats with modest hypertrophy.  Nasal endoscopy was indicated to better evaluate the nose and paranasal sinuses, given the patient's history and exam findings, and is detailed below. No obviously palpable neck masses/lymphadenopathy/thyromegaly No respiratory distress or stridor   PROCEDURE:  Prior to initiating any procedures, risks/benefits/alternatives were explained to the patient and verbal consent obtained. Diagnostic Nasal Endoscopy Pre-procedure diagnosis: Concern for sinusitis, right nasal mass, fungal sinusitis/mycetoma, reassess given prior purulence Post-procedure diagnosis: same Indication: See pre-procedure diagnosis and physical exam above Complications: None apparent EBL: 0 mL Anesthesia: Lidocaine  4% and topical decongestant was topically sprayed in each nasal  cavity  Description of Procedure:  Patient was identified. A rigid 30 degree endoscope was utilized to evaluate the sinonasal cavities, mucosa, sinus ostia and turbinates and septum.  Overall, signs of mucosal inflammation are mild but noted.  No polyps, or masses noted.   Right Middle meatus: clear Right SE Recess: clear today Left MM: clear Left SE Recess: clear  CPT CODE -- 31231 - Mod 25   ASSESSMENT:  69 y.o. with T1DM now with:  1. Other chronic sinusitis   2. Hypertrophy of both inferior nasal turbinates   3. Nasal congestion   4. Post-nasal drip   5. Mycetoma    Noted purulence SE recess prior but not today. On prior and current CT appears to be fungal ball(?) as there is some dual density. He is a type 1 diabetic Thankfully not having vertex headaches but did have them a few weeks ago, and we discussed this could be as a result of his right sinusitis.   We discussed the goals of sinus surgery, and expectations for postoperative management. We discussed R/B/A including pain, infection, bleeding (~3% risk of operative visit for control), persistent symptoms, need for revision surgery, and other risks including damage to the eye and loss of vision, and injury to skull base, carotid injury and related effects, anesthetic complications, among others.  Patient understands and is ready to proceed.  PLAN: We've discussed issues and options today.  We reviewed the nasal endoscopy images together.  The risks, benefits and alternatives were discussed and questions answered.  He has elected to proceed with:  Will post for b/l sphenoid with tissue removal, posterior ethmoid, b/l turbinate reduction, image guidance F/u 1 week post op Will get clearance to hold ASA 81 1 week prior and 1 week after from Dr. Raford  See below regarding exact medications prescribed this encounter including dosages and route: No orders of the defined types were placed in this encounter.    Thank you for  allowing me the opportunity to care for your patient. Please do not hesitate to contact me should you have any other questions.  Sincerely, Eldora Blanch, MD Otolaryngologist (ENT), [facility] Phone: [phone] Fax: [phone]  MDM:  [phone] Complexity/Problems addressed: mod - chronic problems Data complexity: mod - independent CT interpretation - Morbidity: mod - dec for surgery - Prescription Drug prescribed or managed: n  07/23/2024, 2:27 PM      [1] No Known Allergies

## 2024-07-25 ENCOUNTER — Telehealth (HOSPITAL_BASED_OUTPATIENT_CLINIC_OR_DEPARTMENT_OTHER): Payer: Self-pay

## 2024-07-25 NOTE — Telephone Encounter (Signed)
"  ° °  Pre-operative Risk Assessment    Patient Name: Tom Baker  DOB: 1955-07-20 MRN: [id number]   Date of last office visit: 03/08/2023, Dr. Annabella Scarce, MD Date of next office visit: 09/25/2024, Dr. Annabella Scarce, MD   Request for Surgical Clearance    Procedure:  bilateral sphenoidectomy with tissue removed, bilateral inferior turbine reduction  Date of Surgery:  Clearance TBD                                Surgeon: Dr. Tobie Socks Group or Practice Name: [facility] Phone number: [phone] Fax number: [phone]   Type of Clearance Requested:   - Medical  - Pharmacy:  Hold Aspirin  1 week prior to surgery and 1 week after   Type of Anesthesia:  Not Indicated   Additional requests/questions:    Bonney Asberry KANDICE Ethelene   07/25/2024, 4:01 PM   "

## 2024-07-25 NOTE — Telephone Encounter (Signed)
" ° °  Name: Tom Baker  DOB: August 04, 1955  MRN: [id number]  Primary Cardiologist: Annabella Scarce, MD  Chart reviewed as part of pre-operative protocol coverage. Because of Tom Baker's past medical history and time since last visit, he will require a follow-up in-office visit in order to better assess preoperative cardiovascular risk.  Pre-op covering staff: - Please schedule appointment and call patient to inform them. If patient already had an upcoming appointment within acceptable timeframe, please add pre-op clearance to the appointment notes so provider is aware. - Please contact requesting surgeon's office via preferred method (i.e, phone, fax) to inform them of need for appointment prior to surgery.  He has upcoming office visit with Dr. Scarce 09/25/24. If surgery needs to be done sooner, can consider moving it up. Will plan to address aspirin  at visit.   Mardy KATHEE Pizza, FNP  07/25/2024, 4:30 PM   "

## 2024-07-25 NOTE — Telephone Encounter (Signed)
 Spoke with the patient to scheduled preop clearance appt he states that his procedure is scheduled for 11/07/24 and would like to have his preop clearance appt on 09/25/24 with Dr. Raford

## 2024-07-26 ENCOUNTER — Telehealth (INDEPENDENT_AMBULATORY_CARE_PROVIDER_SITE_OTHER): Payer: Self-pay

## 2024-07-26 NOTE — Telephone Encounter (Signed)
-----   Message from Eldora Blanch, MD sent at 07/23/2024  2:37 PM EST ----- Regarding: pre op clearance Hi Nef, Can we send a message to this patietn's cardiologist Dr. Raford in chart to hold Aspirin  81 1 week prior to surgery, and then 1 week after Thanks

## 2024-07-26 NOTE — Telephone Encounter (Signed)
 Pre Op Clearance faxed to Cardiology.

## 2024-09-20 ENCOUNTER — Ambulatory Visit: Admitting: Podiatry

## 2024-09-25 ENCOUNTER — Ambulatory Visit (HOSPITAL_BASED_OUTPATIENT_CLINIC_OR_DEPARTMENT_OTHER): Admitting: Cardiovascular Disease

## 2024-10-30 ENCOUNTER — Ambulatory Visit (INDEPENDENT_AMBULATORY_CARE_PROVIDER_SITE_OTHER): Admitting: Otolaryngology

## 2024-11-07 ENCOUNTER — Ambulatory Visit (HOSPITAL_COMMUNITY): Admit: 2024-11-07 | Admitting: Otolaryngology

## 2024-11-13 ENCOUNTER — Ambulatory Visit (INDEPENDENT_AMBULATORY_CARE_PROVIDER_SITE_OTHER): Admitting: Otolaryngology
